# Patient Record
Sex: Female | Born: 1965 | Race: White | Hispanic: No | Marital: Single | Smoking: Never smoker
Health system: Southern US, Community
[De-identification: ages and names within clinical notes are randomized; demographics above are authoritative.]

## PROBLEM LIST (undated history)

## (undated) DIAGNOSIS — E785 Hyperlipidemia, unspecified: Secondary | ICD-10-CM

## (undated) DIAGNOSIS — G479 Sleep disorder, unspecified: Secondary | ICD-10-CM

## (undated) DIAGNOSIS — Z923 Personal history of irradiation: Secondary | ICD-10-CM

## (undated) DIAGNOSIS — C539 Malignant neoplasm of cervix uteri, unspecified: Secondary | ICD-10-CM

## (undated) DIAGNOSIS — Z8616 Personal history of COVID-19: Secondary | ICD-10-CM

## (undated) DIAGNOSIS — F419 Anxiety disorder, unspecified: Secondary | ICD-10-CM

## (undated) DIAGNOSIS — F32A Depression, unspecified: Secondary | ICD-10-CM

## (undated) DIAGNOSIS — H04123 Dry eye syndrome of bilateral lacrimal glands: Secondary | ICD-10-CM

## (undated) HISTORY — DX: Depression, unspecified: F32.A

## (undated) HISTORY — DX: Sleep disorder, unspecified: G47.9

## (undated) HISTORY — DX: Hyperlipidemia, unspecified: E78.5

## (undated) HISTORY — DX: Anxiety disorder, unspecified: F41.9

## (undated) HISTORY — PX: HERNIA REPAIR: SHX51

## (undated) HISTORY — DX: Personal history of irradiation: Z92.3

---

## 2006-03-28 ENCOUNTER — Encounter: Admission: RE | Admit: 2006-03-28 | Discharge: 2006-03-28 | Payer: Self-pay | Admitting: Unknown Physician Specialty

## 2007-05-06 ENCOUNTER — Encounter: Admission: RE | Admit: 2007-05-06 | Discharge: 2007-05-06 | Payer: Self-pay | Admitting: Unknown Physician Specialty

## 2008-06-09 ENCOUNTER — Encounter: Admission: RE | Admit: 2008-06-09 | Discharge: 2008-06-09 | Payer: Self-pay | Admitting: Unknown Physician Specialty

## 2009-01-08 DIAGNOSIS — M1990 Unspecified osteoarthritis, unspecified site: Secondary | ICD-10-CM

## 2009-01-08 HISTORY — DX: Unspecified osteoarthritis, unspecified site: M19.90

## 2009-06-13 ENCOUNTER — Encounter: Admission: RE | Admit: 2009-06-13 | Discharge: 2009-06-13 | Payer: Self-pay | Admitting: Unknown Physician Specialty

## 2010-06-13 ENCOUNTER — Other Ambulatory Visit: Payer: Self-pay | Admitting: Unknown Physician Specialty

## 2010-06-13 DIAGNOSIS — Z1231 Encounter for screening mammogram for malignant neoplasm of breast: Secondary | ICD-10-CM

## 2010-06-29 ENCOUNTER — Ambulatory Visit
Admission: RE | Admit: 2010-06-29 | Discharge: 2010-06-29 | Disposition: A | Discharge status: Home / Self Care | Payer: 59 | Source: Ambulatory Visit | Attending: Unknown Physician Specialty | Admitting: Unknown Physician Specialty

## 2010-06-29 DIAGNOSIS — Z1231 Encounter for screening mammogram for malignant neoplasm of breast: Secondary | ICD-10-CM

## 2011-05-31 ENCOUNTER — Other Ambulatory Visit: Payer: Self-pay | Admitting: Unknown Physician Specialty

## 2011-05-31 DIAGNOSIS — Z1231 Encounter for screening mammogram for malignant neoplasm of breast: Secondary | ICD-10-CM

## 2011-07-03 ENCOUNTER — Ambulatory Visit
Admission: RE | Admit: 2011-07-03 | Discharge: 2011-07-03 | Disposition: A | Discharge status: Home / Self Care | Payer: 59 | Source: Ambulatory Visit | Attending: Unknown Physician Specialty | Admitting: Unknown Physician Specialty

## 2011-07-03 DIAGNOSIS — Z1231 Encounter for screening mammogram for malignant neoplasm of breast: Secondary | ICD-10-CM

## 2013-04-28 ENCOUNTER — Other Ambulatory Visit: Payer: Self-pay

## 2013-04-28 DIAGNOSIS — Z1231 Encounter for screening mammogram for malignant neoplasm of breast: Secondary | ICD-10-CM

## 2013-05-08 ENCOUNTER — Encounter (INDEPENDENT_AMBULATORY_CARE_PROVIDER_SITE_OTHER): Payer: Self-pay

## 2013-05-08 ENCOUNTER — Ambulatory Visit
Admission: RE | Admit: 2013-05-08 | Discharge: 2013-05-08 | Disposition: A | Discharge status: Home / Self Care | Payer: 59 | Source: Ambulatory Visit

## 2013-05-08 DIAGNOSIS — Z1231 Encounter for screening mammogram for malignant neoplasm of breast: Secondary | ICD-10-CM

## 2014-05-18 ENCOUNTER — Other Ambulatory Visit: Payer: Self-pay

## 2014-05-18 DIAGNOSIS — Z1231 Encounter for screening mammogram for malignant neoplasm of breast: Secondary | ICD-10-CM

## 2014-06-14 ENCOUNTER — Ambulatory Visit
Admission: RE | Admit: 2014-06-14 | Discharge: 2014-06-14 | Disposition: A | Discharge status: Home / Self Care | Payer: 59 | Source: Ambulatory Visit

## 2014-06-14 DIAGNOSIS — Z1231 Encounter for screening mammogram for malignant neoplasm of breast: Secondary | ICD-10-CM

## 2015-07-25 ENCOUNTER — Other Ambulatory Visit: Payer: Self-pay | Admitting: Unknown Physician Specialty

## 2015-07-25 DIAGNOSIS — Z1231 Encounter for screening mammogram for malignant neoplasm of breast: Secondary | ICD-10-CM

## 2015-08-02 ENCOUNTER — Ambulatory Visit
Admission: RE | Admit: 2015-08-02 | Discharge: 2015-08-02 | Disposition: A | Discharge status: Home / Self Care | Payer: 59 | Source: Ambulatory Visit | Attending: Unknown Physician Specialty | Admitting: Unknown Physician Specialty

## 2015-08-02 DIAGNOSIS — Z1231 Encounter for screening mammogram for malignant neoplasm of breast: Secondary | ICD-10-CM

## 2016-08-23 ENCOUNTER — Other Ambulatory Visit: Payer: Self-pay | Admitting: Unknown Physician Specialty

## 2016-08-23 DIAGNOSIS — Z1231 Encounter for screening mammogram for malignant neoplasm of breast: Secondary | ICD-10-CM

## 2016-09-04 ENCOUNTER — Ambulatory Visit
Admission: RE | Admit: 2016-09-04 | Discharge: 2016-09-04 | Disposition: A | Discharge status: Home / Self Care | Payer: 59 | Source: Ambulatory Visit | Attending: Unknown Physician Specialty | Admitting: Unknown Physician Specialty

## 2016-09-04 DIAGNOSIS — Z1231 Encounter for screening mammogram for malignant neoplasm of breast: Secondary | ICD-10-CM

## 2017-09-16 ENCOUNTER — Other Ambulatory Visit: Payer: Self-pay | Admitting: Unknown Physician Specialty

## 2017-09-16 DIAGNOSIS — Z1231 Encounter for screening mammogram for malignant neoplasm of breast: Secondary | ICD-10-CM

## 2017-10-11 ENCOUNTER — Ambulatory Visit
Admission: RE | Admit: 2017-10-11 | Discharge: 2017-10-11 | Disposition: A | Discharge status: Home / Self Care | Payer: Managed Care, Other (non HMO) | Source: Ambulatory Visit | Attending: Unknown Physician Specialty | Admitting: Unknown Physician Specialty

## 2017-10-11 DIAGNOSIS — Z1231 Encounter for screening mammogram for malignant neoplasm of breast: Secondary | ICD-10-CM

## 2018-10-27 ENCOUNTER — Other Ambulatory Visit: Payer: Self-pay | Admitting: Unknown Physician Specialty

## 2018-10-27 DIAGNOSIS — Z1231 Encounter for screening mammogram for malignant neoplasm of breast: Secondary | ICD-10-CM

## 2018-12-19 ENCOUNTER — Other Ambulatory Visit: Payer: Self-pay

## 2018-12-19 ENCOUNTER — Ambulatory Visit
Admission: RE | Admit: 2018-12-19 | Discharge: 2018-12-19 | Disposition: A | Discharge status: Home / Self Care | Payer: Managed Care, Other (non HMO) | Source: Ambulatory Visit | Attending: Unknown Physician Specialty | Admitting: Unknown Physician Specialty

## 2018-12-19 DIAGNOSIS — Z1231 Encounter for screening mammogram for malignant neoplasm of breast: Secondary | ICD-10-CM

## 2020-01-09 DIAGNOSIS — Z85828 Personal history of other malignant neoplasm of skin: Secondary | ICD-10-CM

## 2020-01-09 DIAGNOSIS — Z9889 Other specified postprocedural states: Secondary | ICD-10-CM

## 2020-01-09 DIAGNOSIS — R7303 Prediabetes: Secondary | ICD-10-CM

## 2020-01-09 DIAGNOSIS — C801 Malignant (primary) neoplasm, unspecified: Secondary | ICD-10-CM

## 2020-01-09 HISTORY — DX: Malignant (primary) neoplasm, unspecified: C80.1

## 2020-01-09 HISTORY — DX: Other specified postprocedural states: Z98.890

## 2020-01-09 HISTORY — PX: BASAL CELL CARCINOMA EXCISION: SHX1214

## 2020-01-09 HISTORY — DX: Personal history of other malignant neoplasm of skin: Z85.828

## 2020-01-09 HISTORY — DX: Prediabetes: R73.03

## 2020-01-18 ENCOUNTER — Other Ambulatory Visit: Payer: Self-pay | Admitting: Unknown Physician Specialty

## 2020-01-18 DIAGNOSIS — Z1231 Encounter for screening mammogram for malignant neoplasm of breast: Secondary | ICD-10-CM

## 2020-03-02 ENCOUNTER — Other Ambulatory Visit: Payer: Self-pay

## 2020-03-02 ENCOUNTER — Ambulatory Visit
Admission: RE | Admit: 2020-03-02 | Discharge: 2020-03-02 | Disposition: A | Discharge status: Home / Self Care | Payer: Managed Care, Other (non HMO) | Source: Ambulatory Visit | Attending: Unknown Physician Specialty | Admitting: Unknown Physician Specialty

## 2020-03-02 DIAGNOSIS — Z1231 Encounter for screening mammogram for malignant neoplasm of breast: Secondary | ICD-10-CM

## 2020-08-25 ENCOUNTER — Encounter: Payer: Self-pay | Admitting: Internal Medicine

## 2020-10-11 ENCOUNTER — Ambulatory Visit: Payer: Managed Care, Other (non HMO) | Admitting: Cardiovascular Disease

## 2020-11-10 ENCOUNTER — Ambulatory Visit (INDEPENDENT_AMBULATORY_CARE_PROVIDER_SITE_OTHER): Payer: Managed Care, Other (non HMO) | Admitting: Orthopaedic Surgery

## 2020-11-10 ENCOUNTER — Ambulatory Visit (INDEPENDENT_AMBULATORY_CARE_PROVIDER_SITE_OTHER): Payer: Managed Care, Other (non HMO)

## 2020-11-10 ENCOUNTER — Encounter: Payer: Self-pay | Admitting: Orthopaedic Surgery

## 2020-11-10 ENCOUNTER — Other Ambulatory Visit: Payer: Self-pay

## 2020-11-10 VITALS — BP 120/62 | HR 65 | Ht 66.0 in | Wt 151.0 lb

## 2020-11-10 DIAGNOSIS — M542 Cervicalgia: Secondary | ICD-10-CM

## 2020-11-10 DIAGNOSIS — G959 Disease of spinal cord, unspecified: Secondary | ICD-10-CM | POA: Diagnosis not present

## 2020-11-10 DIAGNOSIS — M4802 Spinal stenosis, cervical region: Secondary | ICD-10-CM

## 2020-11-10 MED ORDER — DIAZEPAM 5 MG PO TABS: ORAL_TABLET | ORAL | 0 refills | Status: DC

## 2020-11-10 NOTE — Progress Notes (Signed)
Office Visit Note   Patient: Robin Steele           Date of Birth: 04/24/1965           MRN: [id number] Visit Date: 11/10/2020              Requested by: Daneil Dolin, [address] [place],  [place] [address] PCP: Rosalee Kaufman, PA-C   Assessment & Plan: Visit Diagnoses:  1. Neck pain   2. 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 Spinal stenosis of cervical region     Plan: Patient has progressive weakness abnormal gait clonus changes consistent with cervical cord compression with cervical facet arthropathy and spondylosis.  With hand numbness this is likely cervical rather than thoracic involvement with cord compression.  Differential includes possible syringomyelia.  Office follow-up after urgent MRI scan.  Valium tablets prescribed for claustrophobia.  Follow-Up Instructions: No follow-ups on file.   Orders:  Orders Placed This Encounter  Procedures   XR Cervical Spine 2 or 3 views   MR Cervical Spine w/o contrast   Meds ordered this encounter  Medications   diazepam (VALIUM) 5 MG tablet    Sig: Take 2 tablets one hour prior to scan. May repeat with last dose if needed. MUST HAVE DRIVER. Handwritten rx.    Dispense:  3 tablet    Refill:  0      Procedures: No procedures performed   Clinical Data: No additional findings.   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  She has had increasing balance problems has not fallen.  She took prednisone 2 weeks ago seemed to help the pain and maybe gave her some energy she is not sure if it helps the tingling in her fingers.  She has noticed weakness in her arms.  Review of Systems positive for cervical conization coming up next week for abnormal cells.  Positive for anxiety bladder  problems history of migraine.  Pregnancy and umbilical hernia surgeries in the past.  Positive for progressive weakness swelling disorders dense August 2022.  All other systems noncontributory HPI.   Objective: Vital Signs: BP 120/62   Pulse 65   Ht 5\' 6"  (1.676 m)   Wt 151 lb (68.5 kg)   BMI 24.37 kg/m   Physical Exam Constitutional:      Appearance: She is well-developed.  HENT:     Head: Normocephalic.     Right Ear: External ear normal.     Left Ear: External ear normal. There is no impacted cerumen.  Eyes:     Pupils: Pupils are equal, round, and reactive to light.  Neck:     Thyroid: No thyromegaly.     Trachea: No tracheal deviation.  Cardiovascular:     Rate and Rhythm: Normal rate.  Pulmonary:     Effort: Pulmonary effort is normal.  Abdominal:     Palpations: Abdomen is soft.  Musculoskeletal:     Cervical back: No rigidity.  Skin:    General: Skin is warm and dry.  Neurological:     Mental Status: She is alert and oriented to person, place, and time.  Psychiatric:        Behavior: Behavior normal.    Ortho Exam patient has 3+ triceps  reflex symmetrical only trace brachial radialis reflex and 2+ biceps reflex.  Patient has 4 beat clonus on the left leg 3 beat clonus on the right leg and 4+ knee jerk left and right knee.  Decreased balance.  Mild weakness wrist flexion extension interossei.  No atrophy of the upper extremities.  Bilateral brachial plexus tenderness no change cervical pain with distraction or or compression.Moderate Lhermitte.   Specialty Comments:  No specialty comments available.  Imaging: XR Cervical Spine 2 or 3 views  Result Date: 11/10/2020 AP lateral cervical spine images are obtained and reviewed.  This shows facet enlargement C3-4 C4-5 C5-6 worse on the left than right.  Uncovertebral degenerative changes straightening of the cervical spine and anterior posterior spurring worse at C6-7 and C4-5 and C5-6. Impression.  Cervical  spondylosis with posterior endplate spurring.    PMFS History: Patient Active Problem List   Diagnosis Date Noted   Spinal stenosis of cervical region 11/10/2020    No past medical history on file.  Family History  Problem Relation Age of Onset   Breast cancer Maternal Aunt     No past surgical history on file. Social History   Occupational History   Not on file  Tobacco Use   Smoking status: Never   Smokeless tobacco: Never  Substance and Sexual Activity   Alcohol use: Not Currently   Drug use: Not on file   Sexual activity: Not on file

## 2020-11-11 ENCOUNTER — Ambulatory Visit (HOSPITAL_COMMUNITY)
Admission: RE | Admit: 2020-11-11 | Discharge: 2020-11-11 | Disposition: A | Discharge status: Home / Self Care | Payer: Managed Care, Other (non HMO) | Source: Ambulatory Visit | Attending: Orthopaedic Surgery | Admitting: Orthopaedic Surgery

## 2020-11-11 ENCOUNTER — Other Ambulatory Visit: Payer: Self-pay

## 2020-11-11 DIAGNOSIS — G959 Disease of spinal cord, unspecified: Secondary | ICD-10-CM | POA: Insufficient documentation

## 2020-11-14 ENCOUNTER — Telehealth: Payer: Self-pay | Admitting: Orthopaedic Surgery

## 2020-11-14 NOTE — Telephone Encounter (Signed)
Pt called requesting document/letter of estimated time off of work after surgery. Please call pt about this matter at [phone]. Pt would like sent thur my chart.

## 2020-11-14 NOTE — Telephone Encounter (Signed)
Can you please advise. Are you anticipating approx 6 weeks out of work post op?

## 2020-11-15 NOTE — Telephone Encounter (Signed)
Letter entered and sent to patient through My Chart. I left voicemail for patient advising.

## 2020-11-16 ENCOUNTER — Other Ambulatory Visit: Payer: Self-pay

## 2020-11-16 ENCOUNTER — Telehealth: Payer: Self-pay

## 2020-11-16 ENCOUNTER — Telehealth: Payer: Self-pay | Admitting: Orthopaedic Surgery

## 2020-11-16 ENCOUNTER — Encounter: Payer: Self-pay | Admitting: Internal Medicine

## 2020-11-16 ENCOUNTER — Ambulatory Visit: Payer: Managed Care, Other (non HMO) | Admitting: Surgery

## 2020-11-16 ENCOUNTER — Telehealth: Payer: Self-pay | Admitting: Internal Medicine

## 2020-11-16 ENCOUNTER — Ambulatory Visit: Payer: Managed Care, Other (non HMO) | Admitting: Internal Medicine

## 2020-11-16 ENCOUNTER — Encounter: Payer: Self-pay | Admitting: Surgery

## 2020-11-16 VITALS — BP 136/85 | HR 78 | Ht 66.0 in | Wt 151.0 lb

## 2020-11-16 VITALS — BP 130/70 | HR 77 | Ht 65.0 in | Wt 151.8 lb

## 2020-11-16 DIAGNOSIS — R002 Palpitations: Secondary | ICD-10-CM

## 2020-11-16 DIAGNOSIS — M4802 Spinal stenosis, cervical region: Secondary | ICD-10-CM

## 2020-11-16 NOTE — Telephone Encounter (Signed)
Pt submitted medical release form, FMLA forms, and $25.00 check to [employer]. Accepted 11/16/20

## 2020-11-16 NOTE — Progress Notes (Signed)
Cardiology Office Note:    Date:  11/16/2020   ID:  Robin Steele, DOB Aug 18, 1965, MRN [id number]  PCP:  Rosine Door   [facility] Providers Cardiologist:  None     Referring MD: Daneil Dolin, MD   No chief complaint on file. Fluttering  History of Present Illness:    Robin Steele is a 55 y.o. female with a hx of anxiety,  referral for palpations  She  notes heart fluttering and racing. It occurs every once in awhile. The racing occurs during the middle of the day. It started in April. It has been approximately each day but it is improving. She had some LH. She notes some dizziness. No syncope. She denies SOB. She feels a knot in the middle of the chest and feels like indigestion. She is planned for disc fusion. Planned by Dr. Lorin Mercy. Family hx includes her rmother who had an MI 55 or 56; she was 37 when passing. Her father had no heart disease, he is deceased. No SCD. No smoking. She drinks sweat tea. He started drinking soft drinks.     08/22/2020 EKG- NSR , non specific ST changes  08/18/2020 TC 211 TG 231 HDL 42 LDL 128 Crt 1.00 LFTs normal Hgb normal A1c 5.8%  Past Medical History:  Diagnosis Date   Anxiety    Depression    Hyperlipidemia      Current Medications: Current Meds  Medication Sig   Ascorbic Acid (VITAMIN C) 1000 MG tablet Take 1,000 mg by mouth at bedtime.   Cyanocobalamin (B-12) 5000 MCG CAPS Take 5,000 mcg by mouth at bedtime.   DULoxetine (CYMBALTA) 60 MG capsule Take 60 mg by mouth at bedtime.   Magnesium 250 MG TABS Take 250 mg by mouth at bedtime.   Multiple Vitamins-Minerals (CENTRUM SILVER 50+WOMEN) TABS Take 1 tablet by mouth at bedtime.   Omega-3 1000 MG CAPS Take 1,000 mg by mouth at bedtime.   Red Yeast Rice 600 MG TABS Take 1,200 mg by mouth at bedtime.   temazepam (RESTORIL) 30 MG capsule Take 30 mg by mouth at bedtime.   valACYclovir (VALTREX) 500 MG tablet Take 500 mg by mouth at bedtime.   zinc  gluconate 50 MG tablet Take 50 mg by mouth at bedtime.     Allergies:   Patient has no known allergies.   Social History   Socioeconomic History   Marital status: Single    Spouse name: Not on file   Number of children: Not on file   Years of education: Not on file   Highest education level: Not on file  Occupational History   Not on file  Tobacco Use   Smoking status: Never   Smokeless tobacco: Never  Substance and Sexual Activity   Alcohol use: Not Currently   Drug use: Not on file   Sexual activity: Not on file  Other Topics Concern   Not on file  Social History Narrative   Not on file   Social Determinants of Health   Financial Resource Strain: Not on file  Food Insecurity: Not on file  Transportation Needs: Not on file  Physical Activity: Not on file  Stress: Not on file  Social Connections: Not on file     Family History: The patient's family history includes Breast cancer in her maternal aunt.  ROS:   Please see the history of present illness.     All other systems reviewed and are negative.  EKGs/Labs/Other Studies  Reviewed:    The following studies were reviewed today:   EKG:  EKG is  ordered today.  The ekg ordered today demonstrates  NSR, non specific st-t changes  Recent Labs: No results found for requested labs within last 8760 hours.  Recent Lipid Panel No results found for: CHOL, TRIG, HDL, CHOLHDL, VLDL, LDLCALC, LDLDIRECT   Risk Assessment/Calculations:           Physical Exam:    VS:    Vitals:   11/16/20 1317  BP: 130/70  Pulse: 77  SpO2: 99%     Wt Readings from Last 3 Encounters:  11/16/20 151 lb (68.5 kg)  11/10/20 151 lb (68.5 kg)     GEN:  Well nourished, well developed in no acute distress HEENT: Normal NECK: No JVD; No carotid bruits LYMPHATICS: No lymphadenopathy CARDIAC: RRR, no murmurs, rubs, gallops RESPIRATORY:  Clear to auscultation without rales, wheezing or rhonchi  ABDOMEN: Soft, non-tender,  non-distended MUSCULOSKELETAL:  No edema; No deformity  SKIN: Warm and dry NEUROLOGIC:  Alert and oriented x 3 PSYCHIATRIC:  Normal affect   ASSESSMENT:    1. Palpitations   - She does not have high risk features including syncope c/f arrhythmia , family hx of SCD, or abnormalities on her EKG. - I have no reservations for her spinal procedure, she is low risk for a cardiac event  PLAN:    In order of problems listed above:  PRN Follow up      Medication Adjustments/Labs and Tests Ordered: Current medicines are reviewed at length with the patient today.  Concerns regarding medicines are outlined above.  Orders Placed This Encounter  Procedures   EKG 12-Lead     Signed, Janina Mayo, MD  11/16/2020 1:23 PM    [facility]

## 2020-11-16 NOTE — Telephone Encounter (Signed)
[employer] forms received. To [employer].

## 2020-11-16 NOTE — Telephone Encounter (Signed)
    Patient Name: Robin Steele  DOB: 1965/04/11 MRN: [id number]  Primary Cardiologist: Janina Mayo, MD  Chart reviewed as part of pre-operative protocol coverage. Patient was seen by Dr. Harl Bowie for a New Patient visit today. Per her office visit note, "I have no reservations for her spinal procedure. She is low risk for a cardiac event."  I will route this recommendation to the requesting party via Epic fax function and remove from pre-op pool.  Please call with questions.  Darreld Mclean, PA-C 11/16/2020, 3:11 PM

## 2020-11-16 NOTE — Pre-Procedure Instructions (Signed)
Surgical Instructions    Your procedure is scheduled on Friday 11/18/20.   Report to [facility] Main Entrance "A" at 05:30 A.M., then check in with the Admitting office.  Call this number if you have problems the morning of surgery:  [phone]   If you have any questions prior to your surgery date call [phone]: Open Monday-Friday 8am-4pm    Remember:  Do not eat after midnight the night before your surgery  You may drink clear liquids until 04:30 A.M. the morning of your surgery.   Clear liquids allowed are: Water, Non-Citrus Juices (without pulp), Carbonated Beverages, Clear Tea, Black Coffee ONLY (NO MILK, CREAM OR POWDERED CREAMER of any kind), and Gatorade    Take these medicines the morning of surgery with A SIP OF WATER   NONE   As of today, STOP taking any Aspirin (unless otherwise instructed by your surgeon) Aleve, Naproxen, Ibuprofen, Motrin, Advil, Goody's, BC's, all herbal medications, fish oil, and all vitamins.     After your COVID test   You are not required to quarantine however you are required to wear a well-fitting mask when you are out and around people not in your household.  If your mask becomes wet or soiled, replace with a new one.  Wash your hands often with soap and water for 20 seconds or clean your hands with an alcohol-based hand sanitizer that contains at least 60% alcohol.  Do not share personal items.  Notify your provider: if you are in close contact with someone who has COVID  or if you develop a fever of 100.4 or greater, sneezing, cough, sore throat, shortness of breath or body aches.             Do not wear jewelry or makeup Do not wear lotions, powders, perfumes/colognes, or deodorant. Do not shave 48 hours prior to surgery.  Men may shave face and neck. Do not bring valuables to the hospital. DO Not wear nail polish, gel polish, artificial nails, or any other type of covering on natural nails including finger and  toenails. If patients have artificial nails, gel coating, etc. that need to be removed by a nail salon, please have this removed prior to surgery or surgery may need to be canceled/delayed if the surgeon/ anesthesia feels like the patient is unable to be adequately monitored.             [facility] is not responsible for any belongings or valuables.  Do NOT Smoke (Tobacco/Vaping)  24 hours prior to your procedure  If you use a CPAP at night, you may bring your mask for your overnight stay.   Contacts, glasses, hearing aids, dentures or partials may not be worn into surgery, please bring cases for these belongings   For patients admitted to the hospital, discharge time will be determined by your treatment team.   Patients discharged the day of surgery will not be allowed to drive home, and someone needs to stay with them for 24 hours.  NO VISITORS WILL BE ALLOWED IN PRE-OP WHERE PATIENTS ARE PREPPED FOR SURGERY.  ONLY 1 SUPPORT PERSON MAY BE PRESENT IN THE WAITING ROOM WHILE YOU ARE IN SURGERY.  IF YOU ARE TO BE ADMITTED, ONCE YOU ARE IN YOUR ROOM YOU WILL BE ALLOWED TWO (2) VISITORS. 1 (ONE) VISITOR MAY STAY OVERNIGHT BUT MUST ARRIVE TO THE ROOM BY 8pm.  Minor children may have two parents present. Special consideration for safety and communication needs will be reviewed on  a case by case basis.  Special instructions:    Oral Hygiene is also important to reduce your risk of infection.  Remember - BRUSH YOUR TEETH THE MORNING OF SURGERY WITH YOUR REGULAR TOOTHPASTE   [facility]- Preparing For Surgery  Before surgery, you can play an important role. Because skin is not sterile, your skin needs to be as free of germs as possible. You can reduce the number of germs on your skin by washing with CHG (chlorahexidine gluconate) Soap before surgery.  CHG is an antiseptic cleaner which kills germs and bonds with the skin to continue killing germs even after washing.     Please do not use if you  have an allergy to CHG or antibacterial soaps. If your skin becomes reddened/irritated stop using the CHG.  Do not shave (including legs and underarms) for at least 48 hours prior to first CHG shower. It is OK to shave your face.  Please follow these instructions carefully.     Shower the NIGHT BEFORE SURGERY and the MORNING OF SURGERY with CHG Soap.   If you chose to wash your hair, wash your hair first as usual with your normal shampoo. After you shampoo, rinse your hair and body thoroughly to remove the shampoo.  Then [employer] and genitals (private parts) with your normal soap and rinse thoroughly to remove soap.  After that Use CHG Soap as you would any other liquid soap. You can apply CHG directly to the skin and wash gently with a scrungie or a clean washcloth.   Apply the CHG Soap to your body ONLY FROM THE NECK DOWN.  Do not use on open wounds or open sores. Avoid contact with your eyes, ears, mouth and genitals (private parts). Wash Face and genitals (private parts)  with your normal soap.   Wash thoroughly, paying special attention to the area where your surgery will be performed.  Thoroughly rinse your body with warm water from the neck down.  DO NOT shower/wash with your normal soap after using and rinsing off the CHG Soap.  Pat yourself dry with a CLEAN TOWEL.  Wear CLEAN PAJAMAS to bed the night before surgery  Place CLEAN SHEETS on your bed the night before your surgery  DO NOT SLEEP WITH PETS.   Day of Surgery:  Take a shower with CHG soap. Wear Clean/Comfortable clothing the morning of surgery Do not apply any deodorants/lotions.   Remember to brush your teeth WITH YOUR REGULAR TOOTHPASTE.   Please read over the following fact sheets that you were given.

## 2020-11-16 NOTE — Progress Notes (Signed)
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  Patient also continues have ongoing neck pain and upper extremity radiculopathy with feeling of unsteady gait.  States that she is wanting to proceed with C4-5 ACDF is scheduled.  Today history and physical performed.  Review of systems negative.  Surgical procedure discussed in great detail along with potential rehab/recovery time.  All questions answered and she wishes to proceed.

## 2020-11-16 NOTE — Telephone Encounter (Signed)
   [facility] Pre-operative Risk Assessment    Patient Name: Robin Steele  DOB: 1965/12/01 MRN: [id number]  HEARTCARE STAFF:  - IMPORTANT!!!!!! Under Visit Info/Reason for Call, type in Other and utilize the format Clearance MM/DD/YY or Clearance TBD. Do not use dashes or single digits. - Please review there is not already an duplicate clearance open for this procedure. - If request is for dental extraction, please clarify the # of teeth to be extracted. - If the patient is currently at the dentist's office, call Pre-Op Callback Staff (MA/nurse) to input urgent request.  - If the patient is not currently in the dentist office, please route to the Pre-Op pool.  Request for surgical clearance:  What type of surgery is being performed? C4-5 Fusion  When is this surgery scheduled? TBD  What type of clearance is required (medical clearance vs. Pharmacy clearance to hold med vs. Both)? Both  Are there any medications that need to be held prior to surgery and how long? None  Practice name and name of physician performing surgery? [facility]  What is the office phone number? [phone]   7.   What is the office fax number? [phone]  8.   Anesthesia type (None, local, MAC, general) ? General   Orvan July 11/16/2020, 12:20 PM  _________________________________________________________________   (provider comments below)

## 2020-11-16 NOTE — Telephone Encounter (Signed)
Pt states that although she established care with Dr. Harl Bowie based on availability and upcoming procedure she is wanting to be in care of Dr. Johnsie Cancel... are you both ok with this?

## 2020-11-16 NOTE — Patient Instructions (Signed)
Medication Instructions:  No Changes In Medications at this time.  *If you need a refill on your cardiac medications before your next appointment, please call your pharmacy*  Follow-Up: At [facility], you and your health needs are our priority.  As part of our continuing mission to provide you with exceptional heart care, we have created designated Provider Care Teams.  These Care Teams include your primary Cardiologist (physician) and Advanced Practice Providers (APPs -  Physician Assistants and Nurse Practitioners) who all work together to provide you with the care you need, when you need it.  Your next appointment:   AS NEEDED   The format for your next appointment:   In Person  Provider:   Janina Mayo, MD {  Other Instructions Palpitations Palpitations are feelings that your heartbeat is not normal. Your heartbeat may feel like it is: Uneven (irregular). Faster than normal. Fluttering. Skipping a beat. This is usually not a serious problem. However, a doctor will do tests and check your medical history to make sure that you do not have a serious heart problem. Follow these instructions at home: Watch for any changes in your condition. Tell your doctor about any changes. Take these actions to help manage your symptoms: Eating and drinking Follow instructions from your doctor about things to eat and drink. You may be told to avoid these things: Drinks that have caffeine in them, such as coffee, tea, soft drinks, and energy drinks. Chocolate. Alcohol. Diet pills. Lifestyle   Try to lower your stress. These things can help you relax: Yoga. Deep breathing and meditation. Guided imagery. This is using words and images to create positive thoughts. Exercise, including swimming, jogging, and walking. Tell your doctor if you have more abnormal heartbeats when you are active. If you have chest pain or feel short of breath with exercise, do not keep doing the exercise until you  are seen by your doctor. Biofeedback. This is using your mind to control things in your body, such as your heartbeat. Get plenty of rest and sleep. Keep a regular bed time. Do not use drugs, such as cocaine or ecstasy. Do not use marijuana. Do not smoke or use any products that contain nicotine or tobacco. If you need help quitting, ask your doctor. General instructions Take over-the-counter and prescription medicines only as told by your doctor. Keep all follow-up visits. You may need more tests if palpitations do not go away or get worse. Contact a doctor if: You keep having fast or uneven heartbeats for a long time. Your symptoms happen more often. Get help right away if: You have chest pain. You feel short of breath. You have a very bad headache. You feel dizzy. You faint. These symptoms may be an emergency. Get help right away. Call your local emergency services (911 in the [place] Do not wait to see if the symptoms will go away. Do not drive yourself to the hospital. Summary Palpitations are feelings that your heartbeat is uneven or faster than normal. It may feel like your heart is fluttering or skipping a beat. Avoid food and drinks that may cause this condition. These include caffeine, chocolate, and alcohol. Try to lower your stress. Do not smoke or use drugs. Get help right away if you faint, feel dizzy, feel short of breath, have chest pain, or have a very bad headache. This information is not intended to replace advice given to you by your health care provider. Make sure you discuss any questions you have  with your health care provider. Document Revised: 05/18/2020 Document Reviewed: 05/18/2020 Elsevier Patient Education  [place].

## 2020-11-17 ENCOUNTER — Encounter (HOSPITAL_COMMUNITY)
Admission: RE | Admit: 2020-11-17 | Discharge: 2020-11-17 | Disposition: A | Discharge status: Home / Self Care | Payer: Managed Care, Other (non HMO) | Source: Ambulatory Visit | Attending: Orthopaedic Surgery | Admitting: Orthopaedic Surgery

## 2020-11-17 ENCOUNTER — Encounter (HOSPITAL_COMMUNITY): Payer: Self-pay | Admitting: *Deleted

## 2020-11-17 VITALS — BP 144/86 | HR 65 | Temp 98.7°F | Resp 17 | Ht 65.0 in | Wt 148.7 lb

## 2020-11-17 DIAGNOSIS — Z20822 Contact with and (suspected) exposure to covid-19: Secondary | ICD-10-CM | POA: Diagnosis not present

## 2020-11-17 DIAGNOSIS — Z01812 Encounter for preprocedural laboratory examination: Secondary | ICD-10-CM | POA: Insufficient documentation

## 2020-11-17 DIAGNOSIS — R7303 Prediabetes: Secondary | ICD-10-CM | POA: Diagnosis not present

## 2020-11-17 DIAGNOSIS — Z01818 Encounter for other preprocedural examination: Secondary | ICD-10-CM

## 2020-11-17 LAB — BASIC METABOLIC PANEL
Anion gap: 6 (ref 5–15)
BUN: 12 mg/dL (ref 6–20)
CO2: 29 mmol/L (ref 22–32)
Calcium: 9.2 mg/dL (ref 8.9–10.3)
Chloride: 104 mmol/L (ref 98–111)
Creatinine, Ser: 1.02 mg/dL — ABNORMAL HIGH (ref 0.44–1.00)
GFR, Estimated: >60 mL/min (ref >60)
Glucose, Bld: 107 mg/dL — ABNORMAL HIGH (ref 70–99)
Potassium: 4 mmol/L (ref 3.5–5.1)
Sodium: 139 mmol/L (ref 135–145)

## 2020-11-17 LAB — CBC
HCT: 44.5 % (ref 36.0–46.0)
Hemoglobin: 14.3 g/dL (ref 12.0–15.0)
MCH: 27.5 pg (ref 26.0–34.0)
MCHC: 32.1 g/dL (ref 30.0–36.0)
MCV: 85.6 fL (ref 80.0–100.0)
Platelets: 315 10*3/uL (ref 150–400)
RBC: 5.2 MIL/uL — ABNORMAL HIGH (ref 3.87–5.11)
RDW: 13 % (ref 11.5–15.5)
WBC: 6.6 10*3/uL (ref 4.0–10.5)
nRBC: 0 % (ref 0.0–0.2)

## 2020-11-17 LAB — GLUCOSE, CAPILLARY: Glucose-Capillary: 115 mg/dL — ABNORMAL HIGH (ref 70–99)

## 2020-11-17 LAB — HEMOGLOBIN A1C
Hgb A1c MFr Bld: 6.2 % — ABNORMAL HIGH (ref 4.8–5.6)
Mean Plasma Glucose: 131.24 mg/dL

## 2020-11-17 LAB — SARS CORONAVIRUS 2 (TAT 6-24 HRS): SARS Coronavirus 2: NEGATIVE

## 2020-11-17 LAB — SURGICAL PCR SCREEN
MRSA, PCR: NEGATIVE
Staphylococcus aureus: POSITIVE — AB

## 2020-11-17 NOTE — Anesthesia Preprocedure Evaluation (Addendum)
Anesthesia Evaluation  Patient identified by MRN, date of birth, ID band Patient awake    Reviewed: Allergy & Precautions, NPO status , Patient's Chart, lab work & pertinent test results  Airway Mallampati: I  TM Distance: >3 FB Neck ROM: Full    Dental no notable dental hx.    Pulmonary    Pulmonary exam normal breath sounds clear to auscultation       Cardiovascular Exercise Tolerance: Good negative cardio ROS Normal cardiovascular exam Rhythm:Regular Rate:Normal     Neuro/Psych PSYCHIATRIC DISORDERS Anxiety Depression negative neurological ROS     GI/Hepatic   Endo/Other  prediabetes  Renal/GU negative Renal ROS  negative genitourinary   Musculoskeletal  (+) Arthritis  (cervical spinal stenosis), Osteoarthritis,    Abdominal   Peds negative pediatric ROS (+)  Hematology negative hematology ROS (+)   Anesthesia Other Findings No numbness/tingling or pain in neck or arms with full neck extension/rotation  Reproductive/Obstetrics                            Anesthesia Physical Anesthesia Plan  ASA: 2  Anesthesia Plan: General   Post-op Pain Management:    Induction: Intravenous  PONV Risk Score and Plan: 3 and Midazolam, Scopolamine patch - Pre-op, Treatment may vary due to age or medical condition, Ondansetron and Dexamethasone  Airway Management Planned: Oral ETT  Additional Equipment: None  Intra-op Plan:   Post-operative Plan: Extubation in OR  Informed Consent: I have reviewed the patients History and Physical, chart, labs and discussed the procedure including the risks, benefits and alternatives for the proposed anesthesia with the patient or authorized representative who has indicated his/her understanding and acceptance.     Dental advisory given  Plan Discussed with: Anesthesiologist and CRNA  Anesthesia Plan Comments: (GETA. Norton Blizzard, MD  )        Anesthesia Quick Evaluation

## 2020-11-17 NOTE — Progress Notes (Signed)
PCP - Clemmie Krill, PA Cardiologist - Pt saw Dr. Phineas Inches due to hx of palpitations- no abnormalities found, cleared for surgery  PPM/ICD - denies   Chest x-ray - denies EKG - 11/16/20 Stress Test - denies ECHO - denies Cardiac Cath - denies  Sleep Study - pt states she did a home sleep study about 5 years ago but was not diagnosed with OSA  DM- pre-diabetic Pt does not check CBG  Blood Thinner Instructions: n/a Aspirin Instructions: n/a  ERAS Protcol - yes, no drink   COVID TEST- 11/17/20 at PAT   Anesthesia review: no  Patient denies shortness of breath, fever, cough and chest pain at PAT appointment   All instructions explained to the patient, with a verbal understanding of the material. Patient agrees to go over the instructions while at home for a better understanding. Patient also instructed to wear a mask in public after being tested for COVID-19. The opportunity to ask questions was provided.

## 2020-11-18 ENCOUNTER — Observation Stay (HOSPITAL_COMMUNITY)
Admission: RE | Admit: 2020-11-18 | Discharge: 2020-11-19 | Disposition: A | Discharge status: Home / Self Care | Payer: Managed Care, Other (non HMO) | Attending: Orthopaedic Surgery | Admitting: Orthopaedic Surgery

## 2020-11-18 ENCOUNTER — Ambulatory Visit (HOSPITAL_COMMUNITY): Payer: Managed Care, Other (non HMO)

## 2020-11-18 ENCOUNTER — Other Ambulatory Visit: Payer: Self-pay

## 2020-11-18 ENCOUNTER — Ambulatory Visit (HOSPITAL_COMMUNITY): Payer: Managed Care, Other (non HMO) | Admitting: Vascular Surgery

## 2020-11-18 ENCOUNTER — Encounter (HOSPITAL_COMMUNITY)
Admission: RE | Disposition: A | Discharge status: Home / Self Care | Payer: Self-pay | Source: Home / Self Care | Attending: Orthopaedic Surgery

## 2020-11-18 ENCOUNTER — Encounter (HOSPITAL_COMMUNITY): Payer: Self-pay | Admitting: Orthopaedic Surgery

## 2020-11-18 DIAGNOSIS — M4802 Spinal stenosis, cervical region: Principal | ICD-10-CM | POA: Insufficient documentation

## 2020-11-18 DIAGNOSIS — Z981 Arthrodesis status: Secondary | ICD-10-CM

## 2020-11-18 DIAGNOSIS — M50021 Cervical disc disorder at C4-C5 level with myelopathy: Secondary | ICD-10-CM | POA: Insufficient documentation

## 2020-11-18 DIAGNOSIS — Z85828 Personal history of other malignant neoplasm of skin: Secondary | ICD-10-CM | POA: Insufficient documentation

## 2020-11-18 DIAGNOSIS — Z419 Encounter for procedure for purposes other than remedying health state, unspecified: Secondary | ICD-10-CM

## 2020-11-18 DIAGNOSIS — R7303 Prediabetes: Secondary | ICD-10-CM | POA: Insufficient documentation

## 2020-11-18 DIAGNOSIS — M50121 Cervical disc disorder at C4-C5 level with radiculopathy: Secondary | ICD-10-CM | POA: Insufficient documentation

## 2020-11-18 HISTORY — PX: ANTERIOR CERVICAL DECOMP/DISCECTOMY FUSION: SHX1161

## 2020-11-18 LAB — GLUCOSE, CAPILLARY: Glucose-Capillary: 99 mg/dL (ref 70–99)

## 2020-11-18 SURGERY — ANTERIOR CERVICAL DECOMPRESSION/DISCECTOMY FUSION 1 LEVEL
Anesthesia: General

## 2020-11-18 MED ORDER — MIDAZOLAM HCL 2 MG/2ML IJ SOLN
INTRAMUSCULAR | Status: AC
  Filled 2020-11-18: qty 2

## 2020-11-18 MED ORDER — LIDOCAINE 2% (20 MG/ML) 5 ML SYRINGE
INTRAMUSCULAR | Status: AC
  Filled 2020-11-18: qty 5

## 2020-11-18 MED ORDER — SODIUM CHLORIDE 0.9% FLUSH: 3.0000 mL | INTRAVENOUS | Status: DC | PRN

## 2020-11-18 MED ORDER — LACTATED RINGERS IV SOLN: INTRAVENOUS | Status: DC

## 2020-11-18 MED ORDER — METHOCARBAMOL 500 MG PO TABS
ORAL_TABLET | ORAL | Status: AC
  Filled 2020-11-18: qty 1

## 2020-11-18 MED ORDER — EPHEDRINE 5 MG/ML INJ
INTRAVENOUS | Status: AC
  Filled 2020-11-18: qty 5

## 2020-11-18 MED ORDER — CEFAZOLIN SODIUM-DEXTROSE 2-4 GM/100ML-% IV SOLN
2.0000 g | INTRAVENOUS | Status: AC
  Administered 2020-11-18: 2 g via INTRAVENOUS

## 2020-11-18 MED ORDER — PROMETHAZINE HCL 25 MG/ML IJ SOLN: 6.2500 mg | INTRAMUSCULAR | Status: DC | PRN

## 2020-11-18 MED ORDER — SODIUM CHLORIDE 0.9% FLUSH
3.0000 mL | Freq: Two times a day (BID) | INTRAVENOUS | Status: DC
  Administered 2020-11-18 (×2): 3 mL via INTRAVENOUS

## 2020-11-18 MED ORDER — MIDAZOLAM HCL 2 MG/2ML IJ SOLN
INTRAMUSCULAR | Status: DC | PRN
  Administered 2020-11-18: 2 mg via INTRAVENOUS

## 2020-11-18 MED ORDER — BUPIVACAINE-EPINEPHRINE 0.5% -1:200000 IJ SOLN
INTRAMUSCULAR | Status: AC
  Filled 2020-11-18: qty 1

## 2020-11-18 MED ORDER — FENTANYL CITRATE (PF) 100 MCG/2ML IJ SOLN
INTRAMUSCULAR | Status: AC
  Filled 2020-11-18: qty 2

## 2020-11-18 MED ORDER — ONDANSETRON HCL 4 MG/2ML IJ SOLN: 4.0000 mg | Freq: Four times a day (QID) | INTRAMUSCULAR | Status: DC | PRN

## 2020-11-18 MED ORDER — SUGAMMADEX SODIUM 200 MG/2ML IV SOLN
INTRAVENOUS | Status: DC | PRN
  Administered 2020-11-18: 200 mg via INTRAVENOUS

## 2020-11-18 MED ORDER — FENTANYL CITRATE (PF) 250 MCG/5ML IJ SOLN
INTRAMUSCULAR | Status: DC | PRN
  Administered 2020-11-18 (×3): 50 ug via INTRAVENOUS

## 2020-11-18 MED ORDER — AMISULPRIDE (ANTIEMETIC) 5 MG/2ML IV SOLN: 10.0000 mg | Freq: Once | INTRAVENOUS | Status: DC | PRN

## 2020-11-18 MED ORDER — DOCUSATE SODIUM 100 MG PO CAPS
100.0000 mg | ORAL_CAPSULE | Freq: Two times a day (BID) | ORAL | Status: DC
  Administered 2020-11-18: 100 mg via ORAL
  Filled 2020-11-18: qty 1

## 2020-11-18 MED ORDER — METHOCARBAMOL 1000 MG/10ML IJ SOLN
500.0000 mg | Freq: Four times a day (QID) | INTRAVENOUS | Status: DC | PRN
  Filled 2020-11-18: qty 5

## 2020-11-18 MED ORDER — FENTANYL CITRATE (PF) 100 MCG/2ML IJ SOLN
25.0000 ug | INTRAMUSCULAR | Status: DC | PRN
  Administered 2020-11-18: 50 ug via INTRAVENOUS

## 2020-11-18 MED ORDER — ROCURONIUM BROMIDE 10 MG/ML (PF) SYRINGE
PREFILLED_SYRINGE | INTRAVENOUS | Status: AC
  Filled 2020-11-18: qty 10

## 2020-11-18 MED ORDER — ACETAMINOPHEN 500 MG PO TABS
ORAL_TABLET | ORAL | Status: AC
  Administered 2020-11-18: 1000 mg via ORAL
  Filled 2020-11-18: qty 2

## 2020-11-18 MED ORDER — SODIUM CHLORIDE 0.9 % IV SOLN: 250.0000 mL | INTRAVENOUS | Status: DC

## 2020-11-18 MED ORDER — PROPOFOL 10 MG/ML IV BOLUS
INTRAVENOUS | Status: DC | PRN
  Administered 2020-11-18: 150 mg via INTRAVENOUS

## 2020-11-18 MED ORDER — SCOPOLAMINE 1 MG/3DAYS TD PT72
MEDICATED_PATCH | TRANSDERMAL | Status: AC
  Administered 2020-11-18: 1.5 mg via TRANSDERMAL
  Filled 2020-11-18: qty 1

## 2020-11-18 MED ORDER — SURGIFLO WITH THROMBIN (HEMOSTATIC MATRIX KIT) OPTIME
TOPICAL | Status: DC | PRN
  Administered 2020-11-18: 1 via TOPICAL

## 2020-11-18 MED ORDER — SCOPOLAMINE 1 MG/3DAYS TD PT72: 1.0000 | MEDICATED_PATCH | TRANSDERMAL | Status: DC

## 2020-11-18 MED ORDER — ACETAMINOPHEN 500 MG PO TABS: 1000.0000 mg | ORAL_TABLET | Freq: Once | ORAL | Status: AC

## 2020-11-18 MED ORDER — EPHEDRINE SULFATE-NACL 50-0.9 MG/10ML-% IV SOSY
PREFILLED_SYRINGE | INTRAVENOUS | Status: DC | PRN
  Administered 2020-11-18 (×3): 5 mg via INTRAVENOUS
  Administered 2020-11-18: 10 mg via INTRAVENOUS

## 2020-11-18 MED ORDER — 0.9 % SODIUM CHLORIDE (POUR BTL) OPTIME
TOPICAL | Status: DC | PRN
  Administered 2020-11-18: 1000 mL

## 2020-11-18 MED ORDER — DULOXETINE HCL 30 MG PO CPEP
60.0000 mg | ORAL_CAPSULE | Freq: Every day | ORAL | Status: DC
  Administered 2020-11-18: 60 mg via ORAL
  Filled 2020-11-18: qty 2

## 2020-11-18 MED ORDER — CHLORHEXIDINE GLUCONATE 0.12 % MT SOLN
OROMUCOSAL | Status: AC
  Administered 2020-11-18: 15 mL via OROMUCOSAL
  Filled 2020-11-18: qty 15

## 2020-11-18 MED ORDER — ONDANSETRON HCL 4 MG/2ML IJ SOLN
INTRAMUSCULAR | Status: AC
  Filled 2020-11-18: qty 2

## 2020-11-18 MED ORDER — CEFAZOLIN SODIUM-DEXTROSE 2-4 GM/100ML-% IV SOLN
INTRAVENOUS | Status: AC
  Filled 2020-11-18: qty 100

## 2020-11-18 MED ORDER — PROPOFOL 10 MG/ML IV BOLUS
INTRAVENOUS | Status: AC
  Filled 2020-11-18: qty 20

## 2020-11-18 MED ORDER — OXYCODONE-ACETAMINOPHEN 5-325 MG PO TABS: 1.0000 | ORAL_TABLET | ORAL | 0 refills | Status: DC | PRN

## 2020-11-18 MED ORDER — METHOCARBAMOL 500 MG PO TABS
500.0000 mg | ORAL_TABLET | Freq: Four times a day (QID) | ORAL | Status: DC | PRN
  Administered 2020-11-18 (×2): 500 mg via ORAL
  Filled 2020-11-18 (×2): qty 1

## 2020-11-18 MED ORDER — ORAL CARE MOUTH RINSE: 15.0000 mL | Freq: Once | OROMUCOSAL | Status: AC

## 2020-11-18 MED ORDER — ROCURONIUM BROMIDE 10 MG/ML (PF) SYRINGE
PREFILLED_SYRINGE | INTRAVENOUS | Status: DC | PRN
  Administered 2020-11-18: 20 mg via INTRAVENOUS
  Administered 2020-11-18: 70 mg via INTRAVENOUS

## 2020-11-18 MED ORDER — OXYCODONE HCL 5 MG PO TABS: 5.0000 mg | ORAL_TABLET | Freq: Once | ORAL | Status: DC | PRN

## 2020-11-18 MED ORDER — ONDANSETRON HCL 4 MG/2ML IJ SOLN
INTRAMUSCULAR | Status: DC | PRN
  Administered 2020-11-18: 4 mg via INTRAVENOUS

## 2020-11-18 MED ORDER — DEXAMETHASONE SODIUM PHOSPHATE 10 MG/ML IJ SOLN
INTRAMUSCULAR | Status: DC | PRN
  Administered 2020-11-18: 10 mg via INTRAVENOUS

## 2020-11-18 MED ORDER — OXYCODONE HCL 5 MG PO TABS
5.0000 mg | ORAL_TABLET | ORAL | Status: DC | PRN
  Administered 2020-11-18: 5 mg via ORAL
  Filled 2020-11-18: qty 1

## 2020-11-18 MED ORDER — ACETAMINOPHEN 325 MG PO TABS
650.0000 mg | ORAL_TABLET | ORAL | Status: DC | PRN
  Administered 2020-11-18: 650 mg via ORAL
  Filled 2020-11-18: qty 2

## 2020-11-18 MED ORDER — ACETAMINOPHEN 650 MG RE SUPP: 650.0000 mg | RECTAL | Status: DC | PRN

## 2020-11-18 MED ORDER — FENTANYL CITRATE (PF) 250 MCG/5ML IJ SOLN
INTRAMUSCULAR | Status: AC
  Filled 2020-11-18: qty 5

## 2020-11-18 MED ORDER — LIDOCAINE 2% (20 MG/ML) 5 ML SYRINGE
INTRAMUSCULAR | Status: DC | PRN
  Administered 2020-11-18: 40 mg via INTRAVENOUS

## 2020-11-18 MED ORDER — HYDROMORPHONE HCL 1 MG/ML IJ SOLN: 0.5000 mg | INTRAMUSCULAR | Status: DC | PRN

## 2020-11-18 MED ORDER — PHENOL 1.4 % MT LIQD: 1.0000 | OROMUCOSAL | Status: DC | PRN

## 2020-11-18 MED ORDER — DEXAMETHASONE SODIUM PHOSPHATE 10 MG/ML IJ SOLN
INTRAMUSCULAR | Status: AC
  Filled 2020-11-18: qty 1

## 2020-11-18 MED ORDER — OXYCODONE HCL 5 MG/5ML PO SOLN: 5.0000 mg | Freq: Once | ORAL | Status: DC | PRN

## 2020-11-18 MED ORDER — MENTHOL 3 MG MT LOZG: 1.0000 | LOZENGE | OROMUCOSAL | Status: DC | PRN

## 2020-11-18 MED ORDER — ONDANSETRON HCL 4 MG PO TABS: 4.0000 mg | ORAL_TABLET | Freq: Four times a day (QID) | ORAL | Status: DC | PRN

## 2020-11-18 MED ORDER — SODIUM CHLORIDE 0.9 % IV SOLN: INTRAVENOUS | Status: DC

## 2020-11-18 MED ORDER — CHLORHEXIDINE GLUCONATE 0.12 % MT SOLN: 15.0000 mL | Freq: Once | OROMUCOSAL | Status: AC

## 2020-11-18 MED ORDER — POLYETHYLENE GLYCOL 3350 17 G PO PACK: 17.0000 g | PACK | Freq: Every day | ORAL | Status: DC

## 2020-11-18 MED ORDER — BUPIVACAINE-EPINEPHRINE 0.5% -1:200000 IJ SOLN
INTRAMUSCULAR | Status: DC | PRN
  Administered 2020-11-18: 6 mL

## 2020-11-18 MED ORDER — METHOCARBAMOL 500 MG PO TABS: 500.0000 mg | ORAL_TABLET | Freq: Four times a day (QID) | ORAL | 0 refills | Status: DC | PRN

## 2020-11-18 SURGICAL SUPPLY — 58 items
AGENT HMST KT MTR STRL THRMB (HEMOSTASIS) ×1
APL SKNCLS STERI-STRIP NONHPOA (GAUZE/BANDAGES/DRESSINGS) ×1
BAG COUNTER SPONGE SURGICOUNT (BAG) ×2 IMPLANT
BAG SPNG CNTER NS LX DISP (BAG) ×1
BAG SURGICOUNT SPONGE COUNTING (BAG) ×1
BENZOIN TINCTURE PRP APPL 2/3 (GAUZE/BANDAGES/DRESSINGS) ×3 IMPLANT
BIT DRILL SM SPINE QC 12 (BIT) ×2 IMPLANT
BLADE CLIPPER SURG (BLADE) IMPLANT
BUR ROUND FLUTED 4 SOFT TCH (BURR) ×2 IMPLANT
BUR ROUND FLUTED 4MM SOFT TCH (BURR) ×1
CLOSURE WOUND 1/2 X4 (GAUZE/BANDAGES/DRESSINGS) ×1
COLLAR CERV LO CONTOUR FIRM DE (SOFTGOODS) ×3 IMPLANT
COVER MAYO STAND STRL (DRAPES) ×7 IMPLANT
COVER SURGICAL LIGHT HANDLE (MISCELLANEOUS) ×3 IMPLANT
DRAPE C-ARM 42X72 X-RAY (DRAPES) ×3 IMPLANT
DRAPE HALF SHEET 40X57 (DRAPES) ×3 IMPLANT
DRAPE MICROSCOPE LEICA (MISCELLANEOUS) ×3 IMPLANT
DURAPREP 6ML APPLICATOR 50/CS (WOUND CARE) ×3 IMPLANT
ELECT COATED BLADE 2.86 ST (ELECTRODE) ×3 IMPLANT
ELECT REM PT RETURN 9FT ADLT (ELECTROSURGICAL) ×3
ELECTRODE REM PT RTRN 9FT ADLT (ELECTROSURGICAL) ×1 IMPLANT
EVACUATOR 1/8 PVC DRAIN (DRAIN) ×1 IMPLANT
GAUZE SPONGE 4X4 12PLY STRL (GAUZE/BANDAGES/DRESSINGS) ×3 IMPLANT
GLOVE SRG 8 PF TXTR STRL LF DI (GLOVE) ×2 IMPLANT
GLOVE SURG ORTHO LTX SZ7.5 (GLOVE) ×6 IMPLANT
GLOVE SURG UNDER POLY LF SZ8 (GLOVE) ×6
GOWN STRL REUS W/ TWL LRG LVL3 (GOWN DISPOSABLE) ×1 IMPLANT
GOWN STRL REUS W/ TWL XL LVL3 (GOWN DISPOSABLE) ×1 IMPLANT
GOWN STRL REUS W/TWL 2XL LVL3 (GOWN DISPOSABLE) ×3 IMPLANT
GOWN STRL REUS W/TWL LRG LVL3 (GOWN DISPOSABLE) ×3
GOWN STRL REUS W/TWL XL LVL3 (GOWN DISPOSABLE) ×3
HALTER HD/CHIN CERV TRACTION D (MISCELLANEOUS) ×3 IMPLANT
KIT BASIN OR (CUSTOM PROCEDURE TRAY) ×3 IMPLANT
KIT TURNOVER KIT B (KITS) ×3 IMPLANT
MANIFOLD NEPTUNE II (INSTRUMENTS) ×1 IMPLANT
NDL 25GX 5/8IN NON SAFETY (NEEDLE) ×1 IMPLANT
NEEDLE 25GX 5/8IN NON SAFETY (NEEDLE) ×3 IMPLANT
NS IRRIG 1000ML POUR BTL (IV SOLUTION) ×3 IMPLANT
PACK ORTHO CERVICAL (CUSTOM PROCEDURE TRAY) ×3 IMPLANT
PAD ARMBOARD 7.5X6 YLW CONV (MISCELLANEOUS) ×6 IMPLANT
PATTIES SURGICAL .5 X.5 (GAUZE/BANDAGES/DRESSINGS) ×2 IMPLANT
PIN FIXATION TEMP W/SHOULDER (PIN) ×2 IMPLANT
PLATE ANT CERV XTEND 1 LV 16 (Plate) ×2 IMPLANT
POSITIONER HEAD DONUT 9IN (MISCELLANEOUS) ×3 IMPLANT
SCREW XTD VAR 4.2 SELF TAP 12 (Screw) ×8 IMPLANT
SPACER ASSEM CERV LORD 9M (Spacer) ×2 IMPLANT
SPONGE T-LAP 18X18 ~~LOC~~+RFID (SPONGE) ×2 IMPLANT
SPONGE T-LAP 4X18 ~~LOC~~+RFID (SPONGE) ×2 IMPLANT
STRIP CLOSURE SKIN 1/2X4 (GAUZE/BANDAGES/DRESSINGS) ×2 IMPLANT
SURGIFLO W/THROMBIN 8M KIT (HEMOSTASIS) ×2 IMPLANT
SUT BONE WAX W31G (SUTURE) ×3 IMPLANT
SUT VIC AB 3-0 PS2 18 (SUTURE) ×3
SUT VIC AB 3-0 PS2 18XBRD (SUTURE) ×1 IMPLANT
SUT VIC AB 4-0 PS2 27 (SUTURE) ×3 IMPLANT
SYR BULB EAR ULCER 3OZ GRN STR (SYRINGE) ×1 IMPLANT
TOWEL GREEN STERILE (TOWEL DISPOSABLE) ×3 IMPLANT
TOWEL GREEN STERILE FF (TOWEL DISPOSABLE) ×3 IMPLANT
WATER STERILE IRR 1000ML POUR (IV SOLUTION) ×3 IMPLANT

## 2020-11-18 NOTE — H&P (Signed)
Robin Steele is an 55 y.o. female.   Chief Complaint: neck pain, UE radiculopathy and unsteady gait. HPI: 55 year old white female history of C4-5: Broad-based disc bulge with a central/left paracentral disc protrusion with mass effect on the cervical spinal cord with associated cord edema,Bilateral uncovertebral degenerative change, Severe bilateral foraminal stenosis and Severe spinal stenosis comes in for preop evaluation.  Patient also continues have ongoing neck pain and upper extremity radiculopathy with feeling of unsteady gait.  States that she is wanting to proceed with C4-5 ACDF is scheduled.  Today history and physical performed.  Review of systems negative  Past Medical History:  Diagnosis Date   Anxiety    Arthritis 2011   in neck and back   Cancer ([place]) 2022   skin cancer, 2 areas removed per pt   Depression    Hyperlipidemia    Pre-diabetes 2022   borderline, hgba1c 5.7 in august 2022    Past Surgical History:  Procedure Laterality Date   BASAL CELL CARCINOMA EXCISION  2022   pt has had areas removed in the past as well   HERNIA REPAIR     pt states she had an umbilical hernia repair about 25 years ago (around 1997)    Family History  Problem Relation Age of Onset   Breast cancer Maternal Aunt    Social History:  reports that she has never smoked. She has never used smokeless tobacco. She reports current alcohol use. She reports that she does not use drugs.  Allergies: No Known Allergies  Medications Prior to Admission  Medication Sig Dispense Refill   Ascorbic Acid (VITAMIN C) 1000 MG tablet Take 1,000 mg by mouth at bedtime.     Cyanocobalamin (B-12) 5000 MCG CAPS Take 5,000 mcg by mouth at bedtime.     DULoxetine (CYMBALTA) 60 MG capsule Take 60 mg by mouth at bedtime.     Magnesium 250 MG TABS Take 250 mg by mouth at bedtime.     Multiple Vitamins-Minerals (CENTRUM SILVER 50+WOMEN) TABS Take 1 tablet by mouth at bedtime.     Omega-3 1000 MG CAPS Take  1,000 mg by mouth at bedtime.     Red Yeast Rice 600 MG TABS Take 1,200 mg by mouth at bedtime.     temazepam (RESTORIL) 30 MG capsule Take 30 mg by mouth at bedtime.     valACYclovir (VALTREX) 500 MG tablet Take 500 mg by mouth at bedtime.     zinc gluconate 50 MG tablet Take 50 mg by mouth at bedtime.      Results for orders placed or performed during the hospital encounter of 11/18/20 (from the past 48 hour(s))  Glucose, capillary     Status: None   Collection Time: 11/18/20  6:10 AM  Result Value Ref Range   Glucose-Capillary 99 70 - 99 mg/dL    Comment: Glucose reference range applies only to samples taken after fasting for at least 8 hours.   No results found.  Review of Systems  Constitutional:  Positive for activity change.  HENT: Negative.    Respiratory: Negative.    Cardiovascular: Negative.   Gastrointestinal: Negative.   Genitourinary: Negative.   Musculoskeletal:  Positive for gait problem and neck pain.  Neurological:  Positive for weakness and numbness.   Blood pressure (!) 168/77, pulse 82, temperature 98.7 F (37.1 C), temperature source Oral, resp. rate 17, height 5\' 5"  (1.651 m), weight 67.6 kg, last menstrual period 12/18/2018, SpO2 98 %. Physical Exam Constitutional:  Appearance: Normal appearance.  HENT:     Head: Normocephalic and atraumatic.     Nose: Nose normal.  Eyes:     Extraocular Movements: Extraocular movements intact.     Pupils: Pupils are equal, round, and reactive to light.  Cardiovascular:     Rate and Rhythm: Regular rhythm.     Heart sounds: Normal heart sounds.  Pulmonary:     Effort: Pulmonary effort is normal. No respiratory distress.     Breath sounds: Normal breath sounds.  Abdominal:     General: There is no distension.  Musculoskeletal:        General: Tenderness present.  Neurological:     Mental Status: She is alert and oriented to person, place, and time.     Assessment/Plan C4-5 HNP/stenosis  We will proceed  with C4-5 ACDF as scheduled.  Surgical procedure discussed in great detail along with potential rehab/recovery time.  All questions answered.   Benjiman Core, PA-C 11/18/2020, 6:51 AM

## 2020-11-18 NOTE — Transfer of Care (Signed)
Immediate Anesthesia Transfer of Care Note  Patient: Robin Steele  Procedure(s) Performed: CERVICAL FOUR -CERVICAL FIVE  ANTERIOR CERVICAL DISCECTOMY FUSION, ALLOGRAFT, PLATE  Patient Location: PACU  Anesthesia Type:General  Level of Consciousness: drowsy, patient cooperative and responds to stimulation  Airway & Oxygen Therapy: Patient Spontanous Breathing and Patient connected to nasal cannula oxygen  Post-op Assessment: Report given to RN, Post -op Vital signs reviewed and stable and Patient moving all extremities X 4  Post vital signs: Reviewed and stable  Last Vitals:  Vitals Value Taken Time  BP    Temp    Pulse    Resp    SpO2      Last Pain:  Vitals:   11/18/20 0604  TempSrc:   PainSc: 1          Complications: No notable events documented.

## 2020-11-18 NOTE — Discharge Instructions (Addendum)
Ok to shower 1 days postop. Cervical collar must be on at all times INCLUDING when showering. Use the extra collar wrapped in saran wrap when you take a shower. Do not apply any creams or ointments to incision.  Do not remove steri-strips.  Can use 4x4 gauze and tape for dressing changes if your dressing came off. You can leave it on until you return to see Dr. Lorin Mercy in one week in the [facility].   No aggressive activity.   No lifting, pushing, pulling.   Do not bend or turn neck.     No driving.   If you have increased pain, or any issues with swallowing, breathing you should contact our office immediately for instructions or go the emergency room.

## 2020-11-18 NOTE — Anesthesia Procedure Notes (Addendum)
Procedure Name: Intubation Date/Time: 11/18/2020 7:36 AM Performed by: Michele Rockers, CRNA Pre-anesthesia Checklist: Patient identified, Patient being monitored, Timeout performed, Emergency Drugs available and Suction available Patient Re-evaluated:Patient Re-evaluated prior to induction Oxygen Delivery Method: Circle System Utilized Preoxygenation: Pre-oxygenation with 100% oxygen Induction Type: IV induction Ventilation: Mask ventilation without difficulty Laryngoscope Size: Mac, 3 and Glidescope Grade View: Grade I Tube type: Oral Tube size: 7.0 mm Number of attempts: 1 Airway Equipment and Method: Stylet Placement Confirmation: ETT inserted through vocal cords under direct vision, positive ETCO2 and breath sounds checked- equal and bilateral Secured at: 21 cm Tube secured with: Tape Dental Injury: Teeth and Oropharynx as per pre-operative assessment

## 2020-11-18 NOTE — Interval H&P Note (Signed)
History and Physical Interval Note:  11/18/2020 7:21 AM  Robin Steele  has presented today for surgery, with the diagnosis of C4-5 stenosis, cord compression, myelopathy.  The various methods of treatment have been discussed with the patient and family. After consideration of risks, benefits and other options for treatment, the patient has consented to  Procedure(s): C4-5 ANTERIOR CERVICAL DISCECTOMY FUSION, ALLOGRAFT, PLATE (N/A) as a surgical intervention.  The patient's history has been reviewed, patient examined, no change in status, stable for surgery.  I have reviewed the patient's chart and labs.  Questions were answered to the patient's satisfaction.     Marybelle Killings

## 2020-11-18 NOTE — Op Note (Signed)
Preop diagnosis: C4-5 disc protrusion with cord edema, myelopathy and severe spinal stenosis  Postop diagnosis: Same  Procedure: C4-5 anterior cervical discectomy and fusion, allograft and plate.  Surgeon: Rodell Perna, MD  Assistant: Benjiman Core, PA-C medically necessary and present for the entire procedure  Anesthesia General +6 cc Marcaine.  Drains 1 Hemovac neck.  Implants:Implants  SPACER ASSEM CERV LORD 75M - [id number]  Inventory Item: SPACER ASSEM CERV LORD 75M Serial no.: [id number] Model/Cat no.: [id number]  Implant name: SPACER ASSEM CERV LORD 75M - [id number] Laterality: N/A Area: Cervical Level 4-5  Manufacturer: [employer] Date of Manufacture:    Action: Implanted Number Used: 1   Device Identifier:  Device Identifier Type:     PLATE ANT CERV XTEND 1 LV 16 - [id number]  Inventory Item: PLATE ANT CERV XTEND 1 LV 16 Serial no.:  Model/Cat no.: [id number]  Implant name: PLATE ANT CERV XTEND 1 LV 16 - [id number] Laterality: N/A Area: Cervical Level 4-5  Manufacturer: [employer] Date of Manufacture:    Action: Implanted Number Used: 1   Device Identifier:  Device Identifier Type:     SCREW XTD VAR 4.2 SELF TAP 12 - [id number]  Inventory Item: SCREW XTD VAR 4.2 SELF TAP 12 Serial no.:  Model/Cat no.: [id number]  Implant name: SCREW XTD VAR 4.2 SELF TAP 12 - [id number] Laterality: N/A Area: Cervical Level 4-5  Manufacturer: [place] Date of Manufacture:    Action: Implanted Number Used: 4   Device Identifier:  Device Identifier Type:    Procedure: After induction general anesthesia intubation with a glide scope the patient is cord edema myelopathy wrist recurrent restraints were applied but not used or needed with traction due to the C4-5 level patient's then neck.  Prepped with DuraPrep after had ultra traction without weight was applied.  Sterile skin marker area squared with towels Betadine Steri-Drape sterile Mayo stand the head and thyroid sheets and  drapes.  Incision was made midline extending to the left based on palpable landmarks.  Platysma divided in line with the skin incision small bleeders coagulated with bipolar cautery.  Prominent spur at C4-5 in short 25 needle placed confirmed appropriate level.  Self-retaining plate retractors with teeth right and left smooth blade cephalad caudad.  Discectomy was performed and operative microscope was brought in we progressed back to the posterior longitudinal ligament removing a large chunk of disc that it extruded through the ligament.  Complete take down the posterior longitudinal ligament and we actually enlarged the area and ended up using a 9 mm lordotic graft.  We did open up additional area to make sure we had complete decompression of the cord due to the patient's myelopathy and cord compression.  Once graft was inserted countersunk 2 mm with gentle traction pulled by CRNA after complete decompression with cord plate was selected globus medical with 12 mm screws and 47mm 1 level plate.  Screws were placed checked under C arm locked down with tiny screwdriver.  Hemovac placed with and out technique using the trocar on the left in line with the skin incision.  Some Surgi-Flo was used in the epidural space prior to placing the graft but the epidural space was dry at the time of graft placement.  Copious irrigation closure platysma 3-0 Vicryl 4-0 Vicryl subcuticular closure tincture benzoin Steri-Strips 4 x 4 tape and soft collar was applied patient tolerated procedure well transferred recovery in stable condition.

## 2020-11-18 NOTE — Anesthesia Postprocedure Evaluation (Signed)
Anesthesia Post Note  Patient: Georgeann Brinkman  Procedure(s) Performed: CERVICAL FOUR -CERVICAL FIVE  ANTERIOR CERVICAL DISCECTOMY FUSION, ALLOGRAFT, PLATE     Patient location during evaluation: PACU Anesthesia Type: General Level of consciousness: awake and alert Pain management: pain level controlled Vital Signs Assessment: post-procedure vital signs reviewed and stable Respiratory status: spontaneous breathing, nonlabored ventilation, respiratory function stable and patient connected to nasal cannula oxygen Cardiovascular status: blood pressure returned to baseline and stable Postop Assessment: no apparent nausea or vomiting Anesthetic complications: no   No notable events documented.  Last Vitals:  Vitals:   11/18/20 1020 11/18/20 1043  BP: (!) 169/84 (!) 148/90  Pulse: 96 82  Resp: 16 18  Temp: 36.5 C 36.7 C  SpO2: 94% 97%    Last Pain:  Vitals:   11/18/20 1043  TempSrc: Oral  PainSc:                  Merlinda Frederick

## 2020-11-19 DIAGNOSIS — M4802 Spinal stenosis, cervical region: Secondary | ICD-10-CM | POA: Diagnosis not present

## 2020-11-19 NOTE — Progress Notes (Signed)
Patient alert and oriented, voiding adequately, skin clean, dry and intact without evidence of skin break down, or symptoms of complications - no redness or edema noted, only slight tenderness at site.  Patient states pain is manageable at time of discharge. Patient has an appointment with MD in 1 week 

## 2020-11-19 NOTE — Progress Notes (Signed)
No events.  Patient stable. Did well with OT. Ready to go home. D/c order placed.

## 2020-11-19 NOTE — Discharge Summary (Signed)
Patient ID: Robin Steele MRN: [id number] DOB/AGE: January 03, 1966 55 y.o.  Admit date: 11/18/2020 Discharge date: 11/19/2020  Admission Diagnoses:  <principal problem not specified>  Discharge Diagnoses:  Active Problems:   Spinal stenosis of cervical region   S/P cervical spinal fusion   Past Medical History:  Diagnosis Date   Anxiety    Arthritis 2011   in neck and back   Cancer ([place]) 2022   skin cancer, 2 areas removed per pt   Depression    Hyperlipidemia    Pre-diabetes 2022   borderline, hgba1c 5.7 in august 2022    Surgeries: Procedure(s): CERVICAL FOUR -CERVICAL FIVE  ANTERIOR CERVICAL DISCECTOMY FUSION, ALLOGRAFT, PLATE on 39/03/90   Consultants (if any):   Discharged Condition: Improved  Hospital Course: Robin Steele is an 55 y.o. female who was admitted 11/18/2020 with a diagnosis of <principal problem not specified> and went to the operating room on 11/18/2020 and underwent the above named procedures.    She was given perioperative antibiotics:  Anti-infectives (From admission, onward)    Start     Dose/Rate Route Frequency Ordered Stop   11/18/20 0700  ceFAZolin (ANCEF) IVPB 2g/100 mL premix        2 g 200 mL/hr over 30 Minutes Intravenous On call to O.R. 11/18/20 0645 11/18/20 0800   11/18/20 0559  ceFAZolin (ANCEF) 2-4 GM/100ML-% IVPB       Note to Pharmacy: Jeralene Huff   : cabinet override      11/18/20 0559 11/18/20 0758     .  She was given sequential compression devices, early ambulation, and appropriate chemoprophylaxis for DVT prophylaxis.  She benefited maximally from the hospital stay and there were no complications.    Recent vital signs:  Vitals:   11/19/20 0404 11/19/20 0730  BP: (!) 152/87 (!) 148/83  Pulse: 80 70  Resp: 18 18  Temp: 98.2 F (36.8 C) 98 F (36.7 C)  SpO2: 99% 100%    Recent laboratory studies:  Lab Results  Component Value Date   HGB 14.3 11/17/2020   Lab Results  Component Value Date    WBC 6.6 11/17/2020   PLT 315 11/17/2020   No results found for: INR Lab Results  Component Value Date   NA 139 11/17/2020   K 4.0 11/17/2020   CL 104 11/17/2020   CO2 29 11/17/2020   BUN 12 11/17/2020   CREATININE 1.02 (H) 11/17/2020   GLUCOSE 107 (H) 11/17/2020    Discharge Medications:   Allergies as of 11/19/2020   No Known Allergies      Medication List     TAKE these medications    B-12 5000 MCG Caps Take 5,000 mcg by mouth at bedtime.   Centrum Silver 50+Women Tabs Take 1 tablet by mouth at bedtime.   DULoxetine 60 MG capsule Commonly known as: CYMBALTA Take 60 mg by mouth at bedtime.   Magnesium 250 MG Tabs Take 250 mg by mouth at bedtime.   methocarbamol 500 MG tablet Commonly known as: Robaxin Take 1 tablet (500 mg total) by mouth every 6 (six) hours as needed for muscle spasms.   Omega-3 1000 MG Caps Take 1,000 mg by mouth at bedtime.   oxyCODONE-acetaminophen 5-325 MG tablet Commonly known as: PERCOCET/ROXICET Take 1 tablet by mouth every 4 (four) hours as needed for severe pain.   Red Yeast Rice 600 MG Tabs Take 1,200 mg by mouth at bedtime.   temazepam 30 MG capsule Commonly known as:  RESTORIL Take 30 mg by mouth at bedtime.   valACYclovir 500 MG tablet Commonly known as: VALTREX Take 500 mg by mouth at bedtime.   vitamin C 1000 MG tablet Take 1,000 mg by mouth at bedtime.   zinc gluconate 50 MG tablet Take 50 mg by mouth at bedtime.        Diagnostic Studies: DG Cervical Spine 2 or 3 views  Result Date: 11/18/2020 CLINICAL DATA:  Fluoroscopic assistance for cervical spine surgery EXAM: CERVICAL SPINE - 2-3 VIEW COMPARISON:  MR cervical spine done on 11/11/2020 FINDINGS: Fluoroscopic images show anterior surgical fusion at C4-C5 level. Intervertebral disc spacer is noted in place. Fluoroscopic time was 15 seconds. Radiation dose is 1.96 mGy. Two images are submitted for review. IMPRESSION: Fluoroscopic assistance was provided  for anterior surgical fusion at C4-C5 level. Electronically Signed   By: Elmer Picker M.D.   On: 11/18/2020 09:45   MR Cervical Spine w/o contrast  Result Date: 11/11/2020 CLINICAL DATA:  Myelopathy, tingling in the upper extremity with weakness EXAM: MRI CERVICAL SPINE WITHOUT CONTRAST TECHNIQUE: Multiplanar, multisequence MR imaging of the cervical spine was performed. No intravenous contrast was administered. COMPARISON:  None. FINDINGS: Alignment: Physiologic. Vertebrae: No acute fracture, evidence of discitis, or bone lesion. Cord: Mild cord edema in the cervical spinal cord at the level of C4-5. Posterior Fossa, vertebral arteries, paraspinal tissues: Posterior fossa demonstrates no focal abnormality. Vertebral artery flow voids are maintained. Paraspinal soft tissues are unremarkable. Disc levels: Discs: Degenerative disease with disc height loss at C5-6 and C6-7. C2-3: No disc protrusion. Mild bilateral facet arthropathy, right worse than left. Moderate right foraminal stenosis. No left foraminal stenosis. C3-4: No disc protrusion. Severe left facet arthropathy. Left uncovertebral degenerative changes. Severe left foraminal stenosis. Mild right foraminal stenosis. No central canal stenosis. C4-5: Broad-based disc bulge with a central/left paracentral disc protrusion with mass effect on the cervical spinal cord with associated cord edema. Bilateral uncovertebral degenerative changes. Severe bilateral foraminal stenosis. Severe spinal stenosis. C5-6: Broad-based disc bulge. Bilateral uncovertebral degenerative changes. Severe bilateral foraminal stenosis. Mild spinal stenosis. C6-7: Broad-based disc bulge with a small central disc protrusion. Bilateral uncovertebral degenerative changes. Severe left foraminal stenosis. Mild right foraminal stenosis. No spinal stenosis. C7-T1: No significant disc bulge. No neural foraminal stenosis. No central canal stenosis. IMPRESSION: 1. Diffuse cervical spine  spondylosis as described above. 2. At C4-5 there is a broad-based disc bulge with a central/left paracentral disc protrusion with mass effect on the cervical spinal cord with associated cord edema. Bilateral uncovertebral degenerative changes. Severe bilateral foraminal stenosis. Severe spinal stenosis. 3.  No acute osseous injury of the cervical spine. Electronically Signed   By: Kathreen Devoid M.D.   On: 11/11/2020 12:16   DG C-Arm 1-60 Min-No Report  Result Date: 11/18/2020 Fluoroscopy was utilized by the requesting physician.  No radiographic interpretation.   DG C-Arm 1-60 Min-No Report  Result Date: 11/18/2020 Fluoroscopy was utilized by the requesting physician.  No radiographic interpretation.   XR Cervical Spine 2 or 3 views  Result Date: 11/10/2020 AP lateral cervical spine images are obtained and reviewed.  This shows facet enlargement C3-4 C4-5 C5-6 worse on the left than right.  Uncovertebral degenerative changes straightening of the cervical spine and anterior posterior spurring worse at C6-7 and C4-5 and C5-6. Impression.  Cervical spondylosis with posterior endplate spurring.   Disposition: Discharge disposition: 01-Home or Self Care       Discharge Instructions     Call MD /  Call 911   Complete by: As directed    If you experience chest pain or shortness of breath, CALL 911 and be transported to the hospital emergency room.  If you develope a fever above 101.5 F, pus (white drainage) or increased drainage or redness at the wound, or calf pain, call your surgeon's office.   Constipation Prevention   Complete by: As directed    Drink plenty of fluids.  Prune juice may be helpful.  You may use a stool softener, such as Colace (over the counter) 100 mg twice a day.  Use MiraLax (over the counter) for constipation as needed.   Driving restrictions   Complete by: As directed    No driving while taking narcotic pain meds.   Incentive spirometry RT   Complete by: As  directed    Increase activity slowly as tolerated   Complete by: As directed    Post-operative opioid taper instructions:   Complete by: As directed    POST-OPERATIVE OPIOID TAPER INSTRUCTIONS: It is important to wean off of your opioid medication as soon as possible. If you do not need pain medication after your surgery it is ok to stop day one. Opioids include: Codeine, Hydrocodone(Norco, Vicodin), Oxycodone(Percocet, oxycontin) and hydromorphone amongst others.  Long term and even short term use of opiods can cause: Increased pain response Dependence Constipation Depression Respiratory depression And more.  Withdrawal symptoms can include Flu like symptoms Nausea, vomiting And more Techniques to manage these symptoms Hydrate well Eat regular healthy meals Stay active Use relaxation techniques(deep breathing, meditating, yoga) Do Not substitute Alcohol to help with tapering If you have been on opioids for less than two weeks and do not have pain than it is ok to stop all together.  Plan to wean off of opioids This plan should start within one week post op of your joint replacement. Maintain the same interval or time between taking each dose and first decrease the dose.  Cut the total daily intake of opioids by one tablet each day Next start to increase the time between doses. The last dose that should be eliminated is the evening dose.           Follow-up Information     Marybelle Killings, MD. Schedule an appointment as soon as possible for a visit today.   Specialty: Orthopedic Surgery Why: need return office visit one week postop. Contact information: [address] [place] [address] [phone]                  Signed: Eduard Roux 11/19/2020, 9:16 AM

## 2020-11-19 NOTE — Evaluation (Signed)
Occupational Therapy Evaluation Patient Details Name: Robin Steele MRN: [id number] DOB: 11-29-1965 Today's Date: 11/19/2020   History of Present Illness Pt is a 55 y/o female presenting on 11/11 with neck pain, UE radiculopathy and unsteady gait.  11/11 S/P C4-5 ACDF. PMH includes: anxiety, arthritis, skin cancer, depression, hernia repair.   Clinical Impression   PTA patient independent. Admitted for above and presenting with cervical precautions and pain.  She was educated on precautions, brace mgmt and wear schedule, activity progression, ADL compensatory techniques and safety. She is completing all self care, transfers and mobility in room with modified independence (after education on techniques). She reports she will have good support initially at dc.  No further questions or concerns.  No further OT needs identified at this time. OT will sign off. Thank you for this referral.       Recommendations for follow up therapy are one component of a multi-disciplinary discharge planning process, led by the attending physician.  Recommendations may be updated based on patient status, additional functional criteria and insurance authorization.   Follow Up Recommendations  No OT follow up    Assistance Recommended at Discharge Intermittent Supervision/Assistance  Functional Status Assessment     Equipment Recommendations  None recommended by OT    Recommendations for Other Services       Precautions / Restrictions Precautions Precautions: Cervical Precaution Booklet Issued: Yes (comment) Precaution Comments: reviewed with pt Required Braces or Orthoses: Cervical Brace Cervical Brace: Soft collar;At all times Restrictions Weight Bearing Restrictions: No      Mobility Bed Mobility Overal bed mobility: Modified Independent             General bed mobility comments: educated on log roll technique    Transfers Overall transfer level: Modified independent                         Balance Overall balance assessment: No apparent balance deficits (not formally assessed)                                         ADL either performed or assessed with clinical judgement   ADL Overall ADL's : Modified independent                                       General ADL Comments: completing ADLs with modified independence, reviewed compensatory tehcniques, safety and recommendations in regards to cervical precautions     Vision         Perception     Praxis      Pertinent Vitals/Pain Pain Assessment: 0-10 Pain Score: 2  Pain Descriptors / Indicators: Discomfort;Operative site guarding Pain Intervention(s): Limited activity within patient's tolerance;Monitored during session;Repositioned     Hand Dominance Right   Extremity/Trunk Assessment Upper Extremity Assessment Upper Extremity Assessment: Overall WFL for tasks assessed (within cervical precautions, pt with mild tingling in finger tips bilaterally but significantly improved since surgery)   Lower Extremity Assessment Lower Extremity Assessment: Overall WFL for tasks assessed   Cervical / Trunk Assessment Cervical / Trunk Assessment: Neck Surgery   Communication Communication Communication: No difficulties   Cognition Arousal/Alertness: Awake/alert Behavior During Therapy: WFL for tasks assessed/performed Overall Cognitive Status: Within Functional Limits for tasks assessed  General Comments       Exercises     Shoulder Instructions      Home Living Family/patient expects to be discharged to:: Private residence Living Arrangements: Alone Available Help at Discharge: Family;Available 24 hours/day (boyfriend and son for first few days home) Type of Home: House       Home Layout: One level     Bathroom Shower/Tub: Teacher, early years/pre: Standard     Home Equipment: None           Prior Functioning/Environment Prior Level of Function : Independent/Modified Independent                        OT Problem List: Pain;Decreased knowledge of precautions;Decreased knowledge of use of DME or AE;Decreased activity tolerance      OT Treatment/Interventions:      OT Goals(Current goals can be found in the care plan section) Acute Rehab OT Goals Patient Stated Goal: home OT Goal Formulation: With patient  OT Frequency:     Barriers to D/C:            Co-evaluation              AM-PAC OT "6 Clicks" Daily Activity     Outcome Measure Help from another person eating meals?: None Help from another person taking care of personal grooming?: None Help from another person toileting, which includes using toliet, bedpan, or urinal?: None Help from another person bathing (including washing, rinsing, drying)?: None Help from another person to put on and taking off regular upper body clothing?: None Help from another person to put on and taking off regular lower body clothing?: None 6 Click Score: 24   End of Session Equipment Utilized During Treatment: Cervical collar Nurse Communication: Mobility status;Other (comment) (needs shower collar)  Activity Tolerance: Patient tolerated treatment well Patient left: with call bell/phone within reach;in bed  OT Visit Diagnosis: Pain Pain - part of body:  (neck)                Time: 7619-5093 OT Time Calculation (min): 23 min Charges:  OT General Charges $OT Visit: 1 Visit OT Evaluation $OT Eval Low Complexity: 1 Low  Jolaine Artist, OT Acute Rehabilitation Services Pager [phone] Office [phone]   Delight Stare 11/19/2020, 10:37 AM

## 2020-11-19 NOTE — Progress Notes (Signed)
Orthopedic Tech Progress Note Patient Details:  Robin Steele 1965-05-30 [id number]   Ortho Devices Type of Ortho Device: Semi-rigid collar (soft collar) Ortho Device/Splint Location: Left with pt at bedside Ortho Device/Splint Interventions: Ordered   Post Interventions Patient Tolerated: Other (comment) (not applied as this is being sent home to use for showering.) Instructions Provided: Care of device  Donta Fuster Jeri Modena 11/19/2020, 11:55 AM

## 2020-11-24 ENCOUNTER — Ambulatory Visit (INDEPENDENT_AMBULATORY_CARE_PROVIDER_SITE_OTHER): Payer: Managed Care, Other (non HMO) | Admitting: Orthopaedic Surgery

## 2020-11-24 ENCOUNTER — Encounter (HOSPITAL_COMMUNITY): Payer: Self-pay | Admitting: Orthopaedic Surgery

## 2020-11-24 ENCOUNTER — Ambulatory Visit: Payer: Self-pay

## 2020-11-24 ENCOUNTER — Other Ambulatory Visit: Payer: Self-pay

## 2020-11-24 VITALS — Ht 65.0 in | Wt 149.0 lb

## 2020-11-24 DIAGNOSIS — Z981 Arthrodesis status: Secondary | ICD-10-CM

## 2020-11-24 NOTE — Progress Notes (Signed)
   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  Still has some tingling in the tip of her fingers thumb and index.  Arm strength she feels is back to normal gait is improved.  X-rays look good.  Return in 5 weeks.  Currently she was scheduled to go back December 23.  Chief Complaint:  Chief Complaint  Patient presents with   Neck - Routine Post Op    11/18/2020 C4-5 ACDF   Visit Diagnoses:  1. Status post cervical spinal fusion     Plan: Return in 5 weeks lateral flexion-extension C-spine x-ray on return.  Patient is very happy the surgical result.  Follow-Up Instructions: Return in about 5 weeks (around 12/29/2020).   Orders:  Orders Placed This Encounter  Procedures   XR Cervical Spine 2 or 3 views   No orders of the defined types were placed in this encounter.   Imaging: No results found.  PMFS History: Patient Active Problem List   Diagnosis Date Noted   S/P cervical spinal fusion 11/18/2020   Spinal stenosis of cervical region 11/10/2020   Past Medical History:  Diagnosis Date   Anxiety    Arthritis 2011   in neck and back   Cancer ([place]) 2022   skin cancer, 2 areas removed per pt   Depression    Hyperlipidemia    Pre-diabetes 2022   borderline, hgba1c 5.7 in august 2022    Family History  Problem Relation Age of Onset   Breast cancer Maternal Aunt     Past Surgical History:  Procedure Laterality Date   ANTERIOR CERVICAL DECOMP/DISCECTOMY FUSION N/A 11/18/2020   Procedure: CERVICAL FOUR -CERVICAL FIVE  ANTERIOR CERVICAL DISCECTOMY FUSION, ALLOGRAFT, PLATE;  Surgeon: Marybelle Killings, MD;  Location: [place];  Service: Orthopedics;  Laterality: N/A;   BASAL CELL CARCINOMA EXCISION  2022   pt has had areas removed in the past as well   HERNIA REPAIR     pt states she  had an umbilical hernia repair about 25 years ago (around 1997)   Social History   Occupational History   Not on file  Tobacco Use   Smoking status: Never   Smokeless tobacco: Never  Vaping Use   Vaping Use: Never used  Substance and Sexual Activity   Alcohol use: Yes    Comment: very occasional, maybe once or twice a year (1 glass of wine)   Drug use: Never   Sexual activity: Not on file

## 2020-12-14 ENCOUNTER — Ambulatory Visit: Payer: Managed Care, Other (non HMO) | Admitting: Cardiovascular Disease

## 2020-12-29 ENCOUNTER — Ambulatory Visit (INDEPENDENT_AMBULATORY_CARE_PROVIDER_SITE_OTHER): Payer: Managed Care, Other (non HMO)

## 2020-12-29 ENCOUNTER — Ambulatory Visit (INDEPENDENT_AMBULATORY_CARE_PROVIDER_SITE_OTHER): Payer: Managed Care, Other (non HMO) | Admitting: Orthopaedic Surgery

## 2020-12-29 ENCOUNTER — Other Ambulatory Visit: Payer: Self-pay

## 2020-12-29 ENCOUNTER — Encounter: Payer: Self-pay | Admitting: Orthopaedic Surgery

## 2020-12-29 VITALS — Ht 65.0 in | Wt 149.0 lb

## 2020-12-29 DIAGNOSIS — Z981 Arthrodesis status: Secondary | ICD-10-CM

## 2020-12-29 NOTE — Progress Notes (Deleted)
° °  Office Visit Note   Patient: Robin Steele           Date of Birth: Jun 01, 1965           MRN: [id number] Visit Date: 12/29/2020              Requested by: Rosalee Kaufman, PA-C [address],  [place] [address] PCP: Rosalee Kaufman, PA-C   Assessment & Plan: Visit Diagnoses:  1. Status post cervical spinal fusion     Plan: ***  Follow-Up Instructions: No follow-ups on file.   Orders:  Orders Placed This Encounter  Procedures   XR Cervical Spine 2 or 3 views   No orders of the defined types were placed in this encounter.     Procedures: No procedures performed   Clinical Data: No additional findings.   Subjective: Chief Complaint  Patient presents with   Neck - Follow-up    11/18/2020 C4-5 ACDF    HPI  Review of Systems   Objective: Vital Signs: Ht 5\' 5"  (1.651 m)    Wt 149 lb (67.6 kg)    LMP 12/18/2018    BMI 24.79 kg/m   Physical Exam  Ortho Exam  Specialty Comments:  No specialty comments available.  Imaging: No results found.   PMFS History: Patient Active Problem List   Diagnosis Date Noted   S/P cervical spinal fusion 11/18/2020   Spinal stenosis of cervical region 11/10/2020   Past Medical History:  Diagnosis Date   Anxiety    Arthritis 2011   in neck and back   Cancer ([place]) 2022   skin cancer, 2 areas removed per pt   Depression    Hyperlipidemia    Pre-diabetes 2022   borderline, hgba1c 5.7 in august 2022    Family History  Problem Relation Age of Onset   Breast cancer Maternal Aunt     Past Surgical History:  Procedure Laterality Date   ANTERIOR CERVICAL DECOMP/DISCECTOMY FUSION N/A 11/18/2020   Procedure: CERVICAL FOUR -CERVICAL FIVE  ANTERIOR CERVICAL DISCECTOMY FUSION, ALLOGRAFT, PLATE;  Surgeon: Marybelle Killings, MD;  Location: [place];  Service: Orthopedics;  Laterality: N/A;   BASAL CELL CARCINOMA EXCISION  2022   pt has had areas removed in the past as well   HERNIA REPAIR     pt states she  had an umbilical hernia repair about 25 years ago (around 1997)   Social History   Occupational History   Not on file  Tobacco Use   Smoking status: Never   Smokeless tobacco: Never  Vaping Use   Vaping Use: Never used  Substance and Sexual Activity   Alcohol use: Yes    Comment: very occasional, maybe once or twice a year (1 glass of wine)   Drug use: Never   Sexual activity: Not on file

## 2021-01-02 NOTE — Progress Notes (Signed)
° °  Post-Op Visit Note   Patient: Robin Steele           Date of Birth: May 23, 1965           MRN: [id number] Visit Date: 12/29/2020 PCP: Rosalee Kaufman, PA-C   Assessment & Plan: Post C4-5 anterior cervical discectomy with preop cord compression abnormal gait clonus which is resolved.  She is walking better no longer following.  Patient states she got good relief preop symptoms and wants to go back to work without restrictions on 12/30/2020.  2 view x-rays cervical spine shows good position cervical fusion with lateral flexion-extension x-ray showing no motion.  Chief Complaint:  Chief Complaint  Patient presents with   Neck - Follow-up    11/18/2020 C4-5 ACDF   Visit Diagnoses:  1. Status post cervical spinal fusion     Plan: Work slip given for work resumption on 12/30/2020 without restriction she is happy with the results of surgery and can return to see me on an as-needed basis.  No clonus on exam normal gait pattern today significantly improved from preop.  Follow-Up Instructions: Return if symptoms worsen or fail to improve.   Orders:  Orders Placed This Encounter  Procedures   XR Cervical Spine 2 or 3 views   No orders of the defined types were placed in this encounter.   Imaging: No results found.  PMFS History: Patient Active Problem List   Diagnosis Date Noted   S/P cervical spinal fusion 11/18/2020   Spinal stenosis of cervical region 11/10/2020   Past Medical History:  Diagnosis Date   Anxiety    Arthritis 2011   in neck and back   Cancer ([place]) 2022   skin cancer, 2 areas removed per pt   Depression    Hyperlipidemia    Pre-diabetes 2022   borderline, hgba1c 5.7 in august 2022    Family History  Problem Relation Age of Onset   Breast cancer Maternal Aunt     Past Surgical History:  Procedure Laterality Date   ANTERIOR CERVICAL DECOMP/DISCECTOMY FUSION N/A 11/18/2020   Procedure: CERVICAL FOUR -CERVICAL FIVE  ANTERIOR CERVICAL  DISCECTOMY FUSION, ALLOGRAFT, PLATE;  Surgeon: Marybelle Killings, MD;  Location: [place];  Service: Orthopedics;  Laterality: N/A;   BASAL CELL CARCINOMA EXCISION  2022   pt has had areas removed in the past as well   HERNIA REPAIR     pt states she had an umbilical hernia repair about 25 years ago (around 1997)   Social History   Occupational History   Not on file  Tobacco Use   Smoking status: Never   Smokeless tobacco: Never  Vaping Use   Vaping Use: Never used  Substance and Sexual Activity   Alcohol use: Yes    Comment: very occasional, maybe once or twice a year (1 glass of wine)   Drug use: Never   Sexual activity: Not on file

## 2021-01-17 ENCOUNTER — Ambulatory Visit: Payer: Managed Care, Other (non HMO) | Admitting: Gastroenterology

## 2021-02-15 HISTORY — PX: CERVICAL CONE BIOPSY: SUR198

## 2021-03-08 NOTE — Progress Notes (Signed)
GYNECOLOGIC ONCOLOGY NEW PATIENT CONSULTATION   Patient Name: Robin Steele  Patient Age: 56 y.o. Date of Service: 03/09/21 Referring Provider: Dr. 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  I spent some time reviewing with the patient her recent procedure and pathology from the cone biopsy.  I also discussed the limitations of my exam today.  Given only partially healed cone bed, it is difficult to tell if the mild thickening that I am feeling of both the cervix as well as the parametria represent disease versus inflammation from recent surgery.  We discussed that neck steps will involve imaging to determine if there is any metastatic disease as well as to better characterize residual tumor in the cervix.  This will help [place] make her final treatment plan.  We will first obtain a PET scan to evaluate for metastatic disease.  If there is evidence of metastatic disease, then we discussed moving forward with primary chemotherapy and radiation.  If no metastatic disease evident, the patient was scheduled for an MRI several days after the PET scan to evaluate the residual cervical lesion.  Based on those results and whether adjuvant radiation is suspected to be needed after surgery if surgery pursued, we will make her final treatment plan.    We discussed possible treatment options in the setting of early stage cervical cancer, including radical hysterectomy with pelvic lymphadenectomy or radiation with sensitizing chemotherapy. The risks and benefits of both were reviewed briefly. I have emphasized that the cure rates for these two treatments are equivalent; however, I discussed that the toxicity profiles are different between these two modalities.  At this juncture, I did not go into detail regarding risks of each  treatment.  We will do this once we have additional information.  If the patient appears to be a surgical candidate following imaging, radical hysterectomy via a minimally invasive approach or abdominal procedure was discussed. We reviewed that prior to early 2018 our practice had been to routinely proceed with a robotic assisted procedure. We discussed that prospective data with endometrial cancer showed that outcomes from a minimally invasive procedure were not inferior as compared to one performed via laparotomy. We also discussed that retrospective data evaluating minimally invasive radical hysterectomy had not demonstrated worse outcomes as compared to an abdominal procedure. However, recent prospective data has suggested that there may be worse outcomes from a minimally invasive procedure. This data was published in November, 2018. A retrospective study from a [place] cancer database also seems to support this conclusion. We reviewed the results of these two studies.  Given the already known size of her tumor as well as this data, if the patient is a surgical candidate, I recommend proceeding via an open approach.  I do not see that the patient has had testing for HIV.  We will plan to do this at one of her future visits when we are getting other lab work.  Discussed the role that HPV plays in the pathogenesis of cervical cancer.  I will call the patient after her PET scan.  If we are pursuing the MRI for evaluation of surgical candidacy, we will keep that scheduled and I will call her after the MRI to discuss results.  If she is a surgical candidate, will need to have a follow-up visit for counseling again regarding final treatment plan as  well as a repeat exam to clinically assess the parametria.  REFERENCES:  1. Derrell Lolling PT, Frumovitz M, Pareja R, et al. Minimally invasive versus abdominal radical hysterectomy for cervicalcancer. N Engl J Med. DOI: 95.6213/[id number].\cb3  2. Melamed A, Margul DJ,  Chen L, et al. Survival after minimally invasive radical hysterectomy for early-stage cervical cancer. N Engl J Med. DOI:10.1056/NEJMoa1804923   A copy of this note was sent to the patient's referring provider.   65 minutes of total time was spent for this patient encounter, including preparation, face-to-face counseling with the patient and coordination of care, and documentation of the encounter.   Eugene Garnet, MD  Division of Gynecologic Oncology  Department of Obstetrics and Gynecology  [facility]  ___________________________________________  Chief Complaint: No chief complaint on file.   History of Present Illness:  Robin Steele is a 56 y.o. y.o. female who is seen in consultation at the request of Dr. Georgian Co for an evaluation of cervical cancer.  The patient has a history of prior abnormal paps, did not require biopsies are procedures, normalized. Has previously been HPV+.  More recent history is as follows: Pap 08/2020: ASC-H, HR HPV positive Colposcopy 09/14/20: ECC negative, CIN3 on cervical biopsies CKC 02/15/21: Invasive adenocarcinoma, grade 2, 2.1cm in maximal diameter, 1.5cm DOI with positive endocervical and deep margins, AIS and CIN3 also noted. ECC with detached fragment of adenocarcinoma. No LVI. P16 strongly positive.  Patient notes being on birth control pills since the age of 25.  Several years ago, her menses became much lighter and less frequent.  About a year ago, she reports having hormone levels tested and that they were in menopausal range.  She stopped her birth control pills at this point.  Over the last year or so, she denies any menstrual bleeding but does have some very light pink spotting after intercourse.  Since her cold knife cone, she continues to have some bleeding, denies any significant discharge.  She has some passage of small clots.  Denies any pelvic pain.  She endorses an okay appetite, she is tired of eating the  same things.  She denies any nausea or emesis.  Since her spine surgery in November, she has intermittent constipation and diarrhea.  She also has a history of urinary incontinence as well as fecal incontinence.  Since her spine surgery, the fecal incontinence has resolved completely.  She thinks that her urinary incontinence has decreased some although she continues to have urinary incontinence at random times (does not seem to be associated with urgency or movement/laugh/cough).  The patient lives in [place].  She works as a Tax adviser at [employer].  Surgical history is notable for an umbilical hernia repair many years ago.  Patient does not remember if she had mesh used.  PAST MEDICAL HISTORY:  Past Medical History:  Diagnosis Date   Anxiety    Arthritis 2011   in neck and back   Cancer (HCC) 2022   skin cancer, 2 areas removed per pt   Depression    Difficulty sleeping    Hyperlipidemia    Pre-diabetes 2022   borderline, hgba1c 5.7 in august 2022     PAST SURGICAL HISTORY:  Past Surgical History:  Procedure Laterality Date   ANTERIOR CERVICAL DECOMP/DISCECTOMY FUSION N/A 11/18/2020   Procedure: CERVICAL FOUR -CERVICAL FIVE  ANTERIOR CERVICAL DISCECTOMY FUSION, ALLOGRAFT, PLATE;  Surgeon: Eldred Manges, MD;  Location: [facility] OR;  Service: Orthopedics;  Laterality: N/A;   BASAL  CELL CARCINOMA EXCISION  2022   pt has had areas removed in the past as well   CERVICAL CONE BIOPSY  02/15/2021   HERNIA REPAIR     pt states she had an umbilical hernia repair about 25 years ago (around 1997), unsure if repaired with mesh    OB/GYN HISTORY:  OB History  Gravida Para Term Preterm AB Living  2 2          SAB IAB Ectopic Multiple Live Births               # Outcome Date GA Lbr Len/2nd Weight Sex Delivery Anes PTL Lv  2 Para           1 Para             Patient's last menstrual period was 12/18/2018.  Age at menarche: 89 Age at menopause: within the last 1-2 years Hx  of HRT: Denies Hx of STDs: HPV  Last pap: See HPI History of abnormal pap smears: Yes  SCREENING STUDIES:  Last mammogram: 02/2020  Last colonoscopy: 2018  MEDICATIONS: Outpatient Encounter Medications as of 03/09/2021  Medication Sig   Ascorbic Acid (VITAMIN C) 1000 MG tablet Take 1,000 mg by mouth at bedtime.   Cyanocobalamin (B-12) 5000 MCG CAPS Take 5,000 mcg by mouth at bedtime.   DULoxetine (CYMBALTA) 60 MG capsule Take by mouth.   EYSUVIS 0.25 % SUSP Apply to eye.   Magnesium 250 MG TABS Take 250 mg by mouth at bedtime.   Magnesium Oxide 250 MG TABS Take 1 tablet by mouth every morning.   Multiple Vitamins-Minerals (CENTRUM SILVER 50+WOMEN) TABS Take 1 tablet by mouth at bedtime.   Omega-3 1000 MG CAPS Take 1,000 mg by mouth at bedtime.   Red Yeast Rice 600 MG TABS Take 1,200 mg by mouth at bedtime.   temazepam (RESTORIL) 30 MG capsule Take by mouth.   valACYclovir (VALTREX) 500 MG tablet Take 500 mg by mouth at bedtime.   zinc gluconate 50 MG tablet Take 50 mg by mouth at bedtime.   [DISCONTINUED] valACYclovir (VALTREX) 500 MG tablet Take 1 tablet by mouth 2 (two) times daily.   DULoxetine (CYMBALTA) 60 MG capsule Take 60 mg by mouth at bedtime.   methocarbamol (ROBAXIN) 500 MG tablet Take 1 tablet (500 mg total) by mouth every 6 (six) hours as needed for muscle spasms. (Patient not taking: Reported on 11/24/2020)   oxyCODONE-acetaminophen (PERCOCET/ROXICET) 5-325 MG tablet Take 1 tablet by mouth every 4 (four) hours as needed for severe pain. (Patient not taking: Reported on 11/24/2020)   temazepam (RESTORIL) 30 MG capsule Take 30 mg by mouth at bedtime.   No facility-administered encounter medications on file as of 03/09/2021.    ALLERGIES:  No Known Allergies   FAMILY HISTORY:  Family History  Problem Relation Age of Onset   Colon cancer Mother        maligant tumor on arm   Endometrial cancer Mother    Breast cancer Maternal Aunt    Colon cancer Maternal Grandmother     Uterine cancer Other    Pancreatic cancer Neg Hx    Prostate cancer Neg Hx    Ovarian cancer Neg Hx      SOCIAL HISTORY:  Social Connections: Not on file    REVIEW OF SYSTEMS:  + Constipation, vaginal bleeding, anxiety. Denies appetite changes, fevers, chills, fatigue, unexplained weight changes. Denies hearing loss, neck lumps or masses, mouth sores, ringing in ears or voice  changes. Denies cough or wheezing.  Denies shortness of breath. Denies chest pain or palpitations. Denies leg swelling. Denies abdominal distention, pain, blood in stools, diarrhea, nausea, vomiting, or early satiety. Denies pain with intercourse, dysuria, frequency, hematuria or incontinence. Denies hot flashes, pelvic pain or vaginal discharge.   Denies joint pain, back pain or muscle pain/cramps. Denies itching, rash, or wounds. Denies dizziness, headaches, numbness or seizures. Denies swollen lymph nodes or glands, denies easy bruising or bleeding. Denies depression, confusion, or decreased concentration.  Physical Exam:  Vital Signs for this encounter:  Blood pressure (!) 154/83, pulse 70, temperature 98.3 F (36.8 C), temperature source Oral, resp. rate 18, height 5\' 5"  (1.651 m), weight 149 lb 3.2 oz (67.7 kg), last menstrual period 12/18/2018, SpO2 100 %. Body mass index is 24.83 kg/m. General: Alert, oriented, no acute distress.  HEENT: Normocephalic, atraumatic. Sclera anicteric.  Chest: Clear to auscultation bilaterally. No wheezes, rhonchi, or rales. Cardiovascular: Regular rate and rhythm, no murmurs, rubs, or gallops.  Abdomen: Normoactive bowel sounds. Soft, nondistended, nontender to palpation. No masses or hepatosplenomegaly appreciated. No palpable fluid wave.  Extremities: Grossly normal range of motion. Warm, well perfused. No edema bilaterally.  Skin: No rashes or lesions.  Lymphatics: No cervical, supraclavicular, or inguinal adenopathy.  GU:  Normal external female genitalia. No  lesions.              Bladder/urethra:  No lesions or masses, well supported bladder             Vagina: Well rugated, no lesions noted.  Minimal blood and discharge within the vaginal vault.             Cervix: Still healing, cone bed inspected.  Cervix itself is what rather flush with the vagina, no visible lesions noted.  The cervix itself is mildly firm, almost complete loss of tissue plane between the posterior cervix and the vagina.             Uterus: Small, mobile, some parametrial induration noted bilaterally, more on the right.             Adnexa: No masses appreciated.  Rectal: Confirms findings above  LABORATORY AND RADIOLOGIC DATA:  Outside medical records were reviewed to synthesize the above history, along with the history and physical obtained during the visit.   Lab Results  Component Value Date   WBC 6.6 11/17/2020   HGB 14.3 11/17/2020   HCT 44.5 11/17/2020   PLT 315 11/17/2020   GLUCOSE 107 (H) 11/17/2020   NA 139 11/17/2020   K 4.0 11/17/2020   CL 104 11/17/2020   CREATININE 1.02 (H) 11/17/2020   BUN 12 11/17/2020   CO2 29 11/17/2020   HGBA1C 6.2 (H) 11/17/2020

## 2021-03-09 ENCOUNTER — Telehealth: Payer: Self-pay | Admitting: Oncology

## 2021-03-09 ENCOUNTER — Encounter: Payer: Self-pay | Admitting: Gynecologic Oncology

## 2021-03-09 ENCOUNTER — Inpatient Hospital Stay: Payer: Managed Care, Other (non HMO) | Attending: Gynecologic Oncology | Admitting: Gynecologic Oncology

## 2021-03-09 ENCOUNTER — Other Ambulatory Visit: Payer: Self-pay

## 2021-03-09 VITALS — BP 154/83 | HR 70 | Temp 98.3°F | Resp 18 | Ht 65.0 in | Wt 149.2 lb

## 2021-03-09 DIAGNOSIS — R413 Other amnesia: Secondary | ICD-10-CM | POA: Diagnosis not present

## 2021-03-09 DIAGNOSIS — Z981 Arthrodesis status: Secondary | ICD-10-CM | POA: Diagnosis not present

## 2021-03-09 DIAGNOSIS — C539 Malignant neoplasm of cervix uteri, unspecified: Secondary | ICD-10-CM | POA: Insufficient documentation

## 2021-03-09 DIAGNOSIS — Z79899 Other long term (current) drug therapy: Secondary | ICD-10-CM | POA: Diagnosis not present

## 2021-03-09 DIAGNOSIS — Z800 Family history of malignant neoplasm of digestive organs: Secondary | ICD-10-CM | POA: Diagnosis not present

## 2021-03-09 DIAGNOSIS — F32A Depression, unspecified: Secondary | ICD-10-CM | POA: Diagnosis not present

## 2021-03-09 DIAGNOSIS — A630 Anogenital (venereal) warts: Secondary | ICD-10-CM | POA: Diagnosis not present

## 2021-03-09 DIAGNOSIS — K5909 Other constipation: Secondary | ICD-10-CM | POA: Diagnosis not present

## 2021-03-09 DIAGNOSIS — Z8049 Family history of malignant neoplasm of other genital organs: Secondary | ICD-10-CM | POA: Diagnosis not present

## 2021-03-09 DIAGNOSIS — Z803 Family history of malignant neoplasm of breast: Secondary | ICD-10-CM | POA: Diagnosis not present

## 2021-03-09 DIAGNOSIS — R32 Unspecified urinary incontinence: Secondary | ICD-10-CM | POA: Insufficient documentation

## 2021-03-09 DIAGNOSIS — E785 Hyperlipidemia, unspecified: Secondary | ICD-10-CM | POA: Insufficient documentation

## 2021-03-09 DIAGNOSIS — Z85828 Personal history of other malignant neoplasm of skin: Secondary | ICD-10-CM | POA: Insufficient documentation

## 2021-03-09 DIAGNOSIS — B977 Papillomavirus as the cause of diseases classified elsewhere: Secondary | ICD-10-CM

## 2021-03-09 DIAGNOSIS — F419 Anxiety disorder, unspecified: Secondary | ICD-10-CM | POA: Insufficient documentation

## 2021-03-09 DIAGNOSIS — M1990 Unspecified osteoarthritis, unspecified site: Secondary | ICD-10-CM | POA: Insufficient documentation

## 2021-03-09 NOTE — Patient Instructions (Signed)
It was a pleasure meeting you today. ? ?As we discussed, you have cervical cancer.  The type of cancer is adenocarcinoma. ? ?Based on the results from your cone, we need some additional information to help determine your stage and what treatment is most appropriate. ? ?If it looks like the cancer has not spread outside of your cervix, we talked about 2 treatment strategies including surgery and chemotherapy with radiation.  The details of your pathology as well as your scans will help [place] make that decision.  It is difficult for me to tell if the cancer has spread outside of your cervix on exam today because you are still in the process of healing from your surgery. ? ?If there is evidence on your scans that the cancer has spread outside of the cervix, then surgery is no longer a treatment option.  In that case, we will move forward with getting you scheduled to see our radiation doctor and her medical oncologist to start radiation with chemotherapy. ?

## 2021-03-09 NOTE — Telephone Encounter (Signed)
Robin Steele with Dr. Doug Sou office to confirm lymphovascular invasion on [id number].   ?

## 2021-03-14 NOTE — Telephone Encounter (Signed)
Dr. Martinique called and said he reviewed accession [phone] and did not see any lymphovascular invasion. ?

## 2021-03-16 ENCOUNTER — Other Ambulatory Visit: Payer: Self-pay | Admitting: Nurse Practitioner

## 2021-03-16 DIAGNOSIS — Z1231 Encounter for screening mammogram for malignant neoplasm of breast: Secondary | ICD-10-CM

## 2021-03-22 ENCOUNTER — Ambulatory Visit
Admission: RE | Admit: 2021-03-22 | Discharge: 2021-03-22 | Disposition: A | Discharge status: Home / Self Care | Payer: Managed Care, Other (non HMO) | Source: Ambulatory Visit | Attending: Nurse Practitioner | Admitting: Nurse Practitioner

## 2021-03-22 ENCOUNTER — Other Ambulatory Visit: Payer: Self-pay

## 2021-03-22 DIAGNOSIS — Z1231 Encounter for screening mammogram for malignant neoplasm of breast: Secondary | ICD-10-CM

## 2021-03-23 ENCOUNTER — Ambulatory Visit (HOSPITAL_COMMUNITY)
Admission: RE | Admit: 2021-03-23 | Discharge: 2021-03-23 | Disposition: A | Discharge status: Home / Self Care | Payer: Managed Care, Other (non HMO) | Source: Ambulatory Visit | Attending: Gynecologic Oncology | Admitting: Gynecologic Oncology

## 2021-03-23 DIAGNOSIS — C539 Malignant neoplasm of cervix uteri, unspecified: Secondary | ICD-10-CM | POA: Insufficient documentation

## 2021-03-23 LAB — GLUCOSE, CAPILLARY: Glucose-Capillary: 99 mg/dL (ref 70–99)

## 2021-03-23 MED ORDER — FLUDEOXYGLUCOSE F - 18 (FDG) INJECTION
7.3800 | Freq: Once | INTRAVENOUS | Status: AC
  Administered 2021-03-23: 7.38 via INTRAVENOUS

## 2021-03-24 ENCOUNTER — Encounter: Payer: Self-pay | Admitting: Gynecologic Oncology

## 2021-03-24 ENCOUNTER — Telehealth: Payer: Self-pay

## 2021-03-24 NOTE — Telephone Encounter (Signed)
Following up with Robin Steele this afternoon. Advised patient that per Dr. Berline Lopes the MRI is going to help finalize the plan and whether surgery is possible. Patient verbalized understanding. ?Notified her that her insurance has approved the MRI and provided her with approval # [id number] and to arrive at [facility] long Monday at 08:30 am. Once Dr. Berline Lopes has looked at the images someone from our office will contact her to review the results. Patient verbalized understanding.  ?

## 2021-03-24 NOTE — Progress Notes (Signed)
Peer to peer performed with Dr. Starleen Blue with patient's insurance for MRI. Approved with auth# [id number]. Auth team notified. ?

## 2021-03-24 NOTE — Telephone Encounter (Signed)
Received phone call from Ms. Brzozowski this morning. Patient states " I saw Dr. Lulu Riding message on my PET scan results. I'm wondering if I need to have the MRI on Monday?" ?Advised patient that I will look into this and someone from the office will call her back this afternoon. ?

## 2021-03-27 ENCOUNTER — Other Ambulatory Visit: Payer: Self-pay

## 2021-03-27 ENCOUNTER — Ambulatory Visit (HOSPITAL_COMMUNITY)
Admission: RE | Admit: 2021-03-27 | Discharge: 2021-03-27 | Disposition: A | Discharge status: Home / Self Care | Payer: Managed Care, Other (non HMO) | Source: Ambulatory Visit | Attending: Gynecologic Oncology | Admitting: Gynecologic Oncology

## 2021-03-27 DIAGNOSIS — C539 Malignant neoplasm of cervix uteri, unspecified: Secondary | ICD-10-CM | POA: Insufficient documentation

## 2021-03-27 MED ORDER — GADOBUTROL 1 MMOL/ML IV SOLN
7.5000 mL | Freq: Once | INTRAVENOUS | Status: AC | PRN
  Administered 2021-03-27: 7.5 mL via INTRAVENOUS

## 2021-03-28 ENCOUNTER — Telehealth: Payer: Self-pay | Admitting: Gynecologic Oncology

## 2021-03-28 NOTE — Telephone Encounter (Signed)
Called patient, discussed MRI. I am concerned about parametrial involvement. Will await additional images, but I am recommending based on pictures obtained that we proceed with plan for primary chemoradiation. I have spoke with karen about scheduling appts with Drs. Kinard and [place]. Will present patient at tumor board. ? ?Jeral Pinch MD ?Gynecologic Oncology ? ? ?

## 2021-03-29 ENCOUNTER — Telehealth: Payer: Self-pay | Admitting: Oncology

## 2021-03-29 DIAGNOSIS — C539 Malignant neoplasm of cervix uteri, unspecified: Secondary | ICD-10-CM

## 2021-03-29 NOTE — Telephone Encounter (Signed)
[facility] and advised her of appointments with Dr. Alvy Bimler on 03/31/21 at 1:00 with 12:30 arrival and with Dr. Sondra Come on 04/04/21 at 9:30 for a nurse eval and 10:00 for a consult.  She verbalized understanding and agreement of all appointments. ?

## 2021-03-31 ENCOUNTER — Other Ambulatory Visit: Payer: Self-pay

## 2021-03-31 ENCOUNTER — Inpatient Hospital Stay: Payer: Managed Care, Other (non HMO) | Admitting: Hematology and Oncology

## 2021-03-31 ENCOUNTER — Encounter: Payer: Self-pay | Admitting: Hematology and Oncology

## 2021-03-31 ENCOUNTER — Encounter: Payer: Self-pay | Admitting: Oncology

## 2021-03-31 ENCOUNTER — Ambulatory Visit: Payer: Managed Care, Other (non HMO)

## 2021-03-31 ENCOUNTER — Other Ambulatory Visit (HOSPITAL_COMMUNITY): Payer: Self-pay

## 2021-03-31 VITALS — BP 125/81 | HR 77 | Temp 98.1°F | Resp 18 | Ht 65.0 in | Wt 146.2 lb

## 2021-03-31 DIAGNOSIS — Z803 Family history of malignant neoplasm of breast: Secondary | ICD-10-CM

## 2021-03-31 DIAGNOSIS — K5909 Other constipation: Secondary | ICD-10-CM | POA: Insufficient documentation

## 2021-03-31 DIAGNOSIS — Z981 Arthrodesis status: Secondary | ICD-10-CM | POA: Diagnosis not present

## 2021-03-31 DIAGNOSIS — Z800 Family history of malignant neoplasm of digestive organs: Secondary | ICD-10-CM | POA: Diagnosis not present

## 2021-03-31 DIAGNOSIS — Z8049 Family history of malignant neoplasm of other genital organs: Secondary | ICD-10-CM

## 2021-03-31 DIAGNOSIS — C539 Malignant neoplasm of cervix uteri, unspecified: Secondary | ICD-10-CM | POA: Diagnosis not present

## 2021-03-31 MED ORDER — ONDANSETRON HCL 8 MG PO TABS
8.0000 mg | ORAL_TABLET | Freq: Two times a day (BID) | ORAL | 1 refills | Status: DC | PRN
  Filled 2021-03-31: qty 30, 15d supply, fill #0

## 2021-03-31 MED ORDER — LIDOCAINE-PRILOCAINE 2.5-2.5 % EX CREA
TOPICAL_CREAM | CUTANEOUS | 3 refills | Status: DC
  Filled 2021-03-31: qty 30, 30d supply, fill #0
  Filled 2021-06-26: qty 30, 30d supply, fill #1

## 2021-03-31 MED ORDER — PROCHLORPERAZINE MALEATE 10 MG PO TABS
10.0000 mg | ORAL_TABLET | Freq: Four times a day (QID) | ORAL | 1 refills | Status: DC | PRN
  Filled 2021-03-31: qty 30, 8d supply, fill #0
  Filled 2021-05-07: qty 30, 8d supply, fill #1

## 2021-03-31 NOTE — Assessment & Plan Note (Signed)
She is currently asymptomatic ?We discussed concurrent chemotherapy with radiation approach with curative intent ?We discussed the role of chemotherapy. The intent is of curative intent. ? ?We discussed some of the risks, benefits, side-effects of cisplatin and its role as chemo sensitizing agent. ?The plan for weekly cisplatin for x5-6 doses along with radiation treatment. ? ?Some of the short term side-effects included, though not limited to, including weight loss, life threatening infections, risk of allergic reactions, need for transfusions of blood products, nausea, vomiting, change in bowel habits, loss of hair, admission to hospital for various reasons, and risks of death.  ? ?Long term side-effects are also discussed including risks of infertility, permanent damage to nerve function, hearing loss, chronic fatigue, kidney damage with possibility needing hemodialysis, and rare secondary malignancy including bone marrow disorders. ? ?The patient is aware that the response rates discussed earlier is not guaranteed.  After a long discussion, patient made an informed decision to proceed with the prescribed plan of care.  ? ?Patient education material was dispensed. ?I will order chemo education class, port placement with plan to start treatment within the next 2 weeks ?I will see her after cycle 1 of treatment on a weekly basis for toxicity review ? ? ?

## 2021-03-31 NOTE — Progress Notes (Addendum)
GYN Location of Tumor / Histology: Cervical ? ?Robin Steele presented with symptoms of: HGSIL (high grade squamous intraepithelial lesion) on Pap smear of cervix ? ?Biopsies revealed: Pathology c/w invasive adenocarcinoma, endocervical and deep margins involved. AIS. HGSIL with involvement of endocervical glands. Post cone ECC: detached fragment of adenocarcinoma. ? ?Past/Anticipated interventions by Gyn/Onc surgery, if any: Dr Berline Lopes ?We will first obtain a PET scan to evaluate for metastatic disease.  If there is evidence of metastatic disease, then we discussed moving forward with primary chemotherapy and radiation.  If no metastatic disease evident, the patient was scheduled for an MRI several days after the PET scan to evaluate the residual cervical lesion.  Based on those results and whether adjuvant radiation is suspected to be needed after surgery if surgery pursued, we will make her final treatment plan.  ? ?Past/Anticipated interventions by medical oncology, if any: pending ? ?Weight changes, if any: no ? ?Bowel/Bladder complaints, if any:  constipation, no urinary symptoms ? ?Nausea/Vomiting, if any: no ? ?Pain issues, if any:  no ? ?SAFETY ISSUES: ?Prior radiation? no ?Pacemaker/ICD? no ?Possible current pregnancy? no ?Is the patient on methotrexate? no ? ?Current Complaints / other details:  mild tingling in fingers after spinal fusion ? ?Vitals:  ? 04/04/21 0925  ?BP: 133/78  ?Pulse: 74  ?Resp: 18  ?Temp: (!) 97.1 ?F (36.2 ?C)  ?TempSrc: Temporal  ?SpO2: 100%  ?Weight: 147 lb 4 oz (66.8 kg)  ?Height: '5\' 5"'$  (1.651 m)  ? ? ? ?

## 2021-03-31 NOTE — Progress Notes (Signed)
START OFF PATHWAY REGIMEN - Other   [id number]:Cisplatin 40 mg/m2 IV D1 q7 Days + RT:   A cycle is every 7 days:     Cisplatin   **Always confirm dose/schedule in your pharmacy ordering system**  Patient Characteristics: Intent of Therapy: Curative Intent, Discussed with Patient 

## 2021-03-31 NOTE — Progress Notes (Signed)
Met with Robin Steele and provided her with the [facility] folder and reviewed her upcoming appointments with Dr. Kinard on 04/04/21 and CT Simulation on 04/05/21 at 9:00.  Encouraged her to call with any questions or concerns. ?

## 2021-03-31 NOTE — Progress Notes (Signed)
Combine ?CONSULT NOTE ? ?Patient Care Team: ?Robin Steele as PCP - General (Physician Assistant) ?Janina Mayo, MD as PCP - Cardiology (Cardiology) ?Curlene Labrum, MD as Physician Assistant (Family Medicine) ?Awanda Mink Craige Cotta, RN as Oncology Nurse Navigator (Oncology) ? 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 The intent is of curative intent. ? ?We discussed some of the risks, benefits, side-effects of cisplatin and its role as chemo sensitizing agent. ?The plan for weekly cisplatin for x5-6 doses along with radiation treatment. ? ?Some of the short term side-effects included, though not limited to, including weight loss, life threatening infections, risk of allergic reactions, need for transfusions of blood products, nausea, vomiting, change in bowel habits, loss of hair, admission to hospital for various reasons, and risks of death.  ? ?Long term side-effects are also discussed including risks of infertility, permanent damage to nerve function, hearing loss, chronic fatigue, kidney damage with possibility needing hemodialysis, and rare secondary malignancy including bone marrow disorders. ? ?The patient is aware that the response rates discussed earlier is not guaranteed.  After a long discussion, patient made an informed decision to proceed with the prescribed plan of care.  ? ?Patient education material was dispensed. ?I will order chemo education class, port placement with plan to start treatment within the next 2 weeks ?I will see her after cycle 1 of treatment on a weekly basis for toxicity review ? ? ? 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 ?Other constipation ?She has  chronic constipation at baseline ?We discussed the importance of regular laxatives ? ?Orders Placed This Encounter  ?Procedures  ? IR IMAGING GUIDED PORT INSERTION  ?  Standing Status:   Future  ?  Standing Expiration Date:   04/01/2022  ?  Order Specific Question:   Reason for Exam (SYMPTOM  OR DIAGNOSIS REQUIRED)  ?  Answer:   need port for chemo  ?  Order Specific Question:   Is the patient pregnant?  ?  Answer:   No  ?  Order Specific Question:   Preferred Imaging Location?  ?  Answer:   [facility]  ? CBC with Differential ([place] Only)  ?  Standing Status:   Standing  ?  Number of Occurrences:   20  ?  Standing Expiration Date:   04/01/2022  ? [place] Only  ?  Standing Status:   Standing  ?  Number of Occurrences:   20  ?  Standing Expiration Date:   04/01/2022  ? [place]  ?  Standing Status:   Standing  ?  Number of Occurrences:   20  ?  Standing Expiration Date:   04/01/2022  ? ? ?The total time spent in the appointment was 60 minutes encounter with patients including review of chart and various tests results, discussions about plan of care and coordination of care plan ? ? All questions were answered. The patient knows to call the clinic with any problems, questions or concerns. No barriers to learning was detected. ? ?Heath Lark, MD ?3/24/20232:24 PM ? ?CHIEF COMPLAINTS/PURPOSE OF CONSULTATION:  ?Cervical cancer, for chemotherapy ? ?HISTORY OF PRESENTING ILLNESS:  ?Robin Steele 56 y.o. female is here because of recent  diagnosis of cervical cancer ?The patient have history of abnormal Pap smear ?Recent biopsy show invasive cancer ?She has no new symptoms such as vaginal bleeding, discharge or pelvic pain ? ?I have reviewed her chart and materials related to her cancer extensively and collaborated history with the patient. 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 The patient has a  history of prior abnormal paps, did not require biopsies are procedures, normalized. Has previously been HPV+.  More recent history is as follows: ?Pap 08/2020: ASC-H, HR HPV positive ?Colposcopy 09/14/20: ECC negative, CIN3 on cervical biopsies ?  ?02/15/2021 Pathology Results  ? [id number] ? ?Cone biopsy of cervix showed invasive adenocarcinoma, 2.1 cm in maximum dimension, 1.5 cm depth of invasion.  Endocervical and deep margins are involved.  High-grade squamous intraepithelial lesion with involvement of endocervical glands were present.  Endocervix curettage show detached fragment of adenocarcinoma. ?P16 is strongly positive. ?  ?03/09/2021 Initial Diagnosis  ? 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  ?03/24/2021 PET scan  ? 1. Moderate activity in the cervix with maximum SUV of 5.6. Some of this could be postprocedural/inflammatory related to recent conization, but residual tumor is not excluded. No findings of ?distant metastatic spread or hypermetabolic adenopathy. ?  ?03/30/2021 Imaging  ? MRI pelvis ?Repeat imaging was performed with intersection again showing the tumor biased towards the anterior aspect of the cervix but tracking towards the RIGHT posterolateral cervix with stromal disruption at the level of the inferior posterolateral cervix on the RIGHT (image 19/40) ?  ?Contrasted imaging (image 20/44) this shows contrast enhancement which follows this pattern. Early extension into the RIGHT posterolateral parametrium at the lower margin of the cervix is demonstrated on both contrasted and precontrast T2 weighted imaging. The lesion involves RIGHT anterolateral 2/3 of the cervix with loss of endocervical distinction noted on image 19 of 40 across the entire interface between the mass and the cervix, cervical ?disruption of stroma noted RIGHT posterolateral aspect as described.   ?Ovaries are normal. ?  ?Final impression: Cervical neoplasm with extension into cervical stroma and disruption of cervical stroma most  notably along the RIGHT posterolateral margin with early extension into the RIGHT parametrium. ?  ?  ?03/31/2021 Cancer Staging  ? Staging form: Cervix Uteri, AJCC Version 9 ?- Clinical stage from 03/31/2021: Stage IIB (cT2b, cN0, cM0) - Signed by Heath Lark, MD on 03/31/2021 ?Stage prefix: Initial diagnosis ? ?  ?04/12/2021 -  Chemotherapy  ? Patient is on Treatment Plan : Cervical Cisplatin q7d  ?   ? ? ?MEDICAL HISTORY:  ?Past Medical History:  ?Diagnosis Date  ? Anxiety   ? Arthritis 2011  ? in neck and back  ? Cancer [facility]) 2022  ? skin cancer, 2 areas removed per pt  ? Depression   ? Difficulty sleeping   ? Hyperlipidemia   ? Pre-diabetes 2022  ? borderline, hgba1c 5.7 in august 2022  ? ? ?SURGICAL HISTORY: ?Past Surgical History:  ?Procedure Laterality Date  ? ANTERIOR CERVICAL DECOMP/DISCECTOMY FUSION N/A 11/18/2020  ? Procedure: CERVICAL FOUR -CERVICAL FIVE  ANTERIOR CERVICAL DISCECTOMY FUSION, ALLOGRAFT, PLATE;  Surgeon: Marybelle Killings, MD;  Location: [place];  Service: Orthopedics;  Laterality: N/A;  ? BASAL CELL CARCINOMA EXCISION  2022  ? pt has had areas removed in the past as well  ? CERVICAL CONE BIOPSY  02/15/2021  ? HERNIA REPAIR    ? pt states she had an umbilical hernia repair  about 25 years ago (around 1997), unsure if repaired with mesh  ? ? ?SOCIAL HISTORY: ?Social History  ? ?Socioeconomic History  ? Marital status: Single  ?  Spouse name: Not on file  ? Number of children: Not on file  ? Years of education: Not on file  ? Highest education level: Not on file  ?Occupational History  ? Not on file  ?Tobacco Use  ? Smoking status: Never  ? Smokeless tobacco: Never  ?Vaping Use  ? Vaping Use: Never used  ?Substance and Sexual Activity  ? Alcohol use: Yes  ?  Comment: very occasional, maybe once or twice a year (1 glass of wine)  ? Drug use: Never  ? Sexual activity: Yes  ?Other Topics Concern  ? Not on file  ?Social History Narrative  ? Not on file  ? ?Social Determinants of Health  ? ?Financial  Resource Strain: Not on file  ?Food Insecurity: Not on file  ?Transportation Needs: Not on file  ?Physical Activity: Not on file  ?Stress: Not on file  ?Social Connections: Not on file  ?Intimate Partner Violence

## 2021-03-31 NOTE — Assessment & Plan Note (Signed)
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

## 2021-03-31 NOTE — Assessment & Plan Note (Signed)
She has chronic constipation at baseline ?We discussed the importance of regular laxatives ?

## 2021-04-03 ENCOUNTER — Other Ambulatory Visit: Payer: Self-pay | Admitting: Oncology

## 2021-04-03 NOTE — Progress Notes (Signed)
?Radiation Oncology         [phone] ?________________________________ ? ?Initial Outpatient Consultation ? ?Name: Robin Steele MRN: [id number]  ?Date: 04/04/2021  DOB: 1965/06/14 ? ?UT:[id number], Aundra Millet, PA-C  Lafonda Mosses, MD  ? ?REFERRING PHYSICIAN: Lafonda Mosses, MD ? 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 ? 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 ? 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 ?HISTORY OF PRESENT ILLNESS::Robin Steele is a 56 y.o. female who is seen as a courtesy of Dr. Berline Lopes for an opinion concerning radiation therapy as part of management for her recently diagnosed cervical cancer.  ? ?The patient has a history of abnormal Pap smears, none of which required biopsies or procedures in the past until a Pap performed in August of 2022 showing HPV positive ASCUS. Subsequently, the patient underwent a colposcopy at [facility] on 09/14/20. 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  ? ?Upon further discussion with Dr. Addison Bailey at [facility], the patient agreed to proceed with CKC on 02/15/21. Pathology from the procedure revealed grade 2 invasive adenocarcinoma (p16 strongly positive) measuring 2.1 cm in the max diameter, with the endocervical and deep margins involved. Path also showed AIS and HGSIL with involvement of endocervical glands. Endocervical curettage also showed a detatched fragment of adenocarcinoma.  ? ?Subsequently, the patient was referred to Dr. Berline Lopes on 03/09/21 for further management. During this visit, the patient reported being on birth control pills since the age of 43, and that her menses became much lighter and less  frequent several years ago. Around 1 year ago, she stated that she had her hormone levels tested which showed signs of menopause, prompting her to stop oral contraception around that time. The patient was also noted to report light pink discharge following intercourse. Since her CKC, the patient also reported some vaginal bleeding, but denied any pelvic pain. Physical exam performed during this visit revealed ongoing healing consistent with recent CKC, and complete loss of tissue plane between the posterior cervix and vagina. No visible lesions were noted. Further work-up was discussed with the patient including PET to rule out metastatic disease.  ? ?Subsequent PET on 03/23/21 showed moderate activity in the cervix with maximum SUV of 5.6. Activity appreciated was noted as possibly postprocedural/inflammatory, related to recent conization, but residual tumor could not be excluded. Otherwise, PER showed no evidence of distant metastatic spread or hypermetabolic adenopathy.  ? ?MRI of the pelvis on 03/27/21 demonstrated signs of cervical neoplasm along the anterior and right lateral ?cervix, with potential extension into the right parametrium disrupting the internal endocervical boundaries, and involvement of cervical stroma. No signs of adenopathy were appreciated in the pelvis. MRI of the pelvis was repeated on 03/30/21 again showing the cervical neoplasm extending into cervical ?stroma, with disruption of cervical stroma most notably along the right posterolateral margin with early extension into the right parametrium. ? ?Given no evidence of metastatic disease, the patient was referred to Dr. Alvy Bimler on 03/31/21 to discuss systemic treatment options. Following discussion of the risks and benefits, the patient agreed to proceed with chemotherapy consisting of weekly cisplatin for x 5-6 doses, along with concurrent RT.  ? ?Of note: The patient underwent cervical spinal fusion on  11/18/20 under the care of Dr.  Lorin Mercy. Patient also had her most recent screening mammogram on 03/22/21 which showed no evidence of malignancy in either breast.  ? ? ?PREVIOUS RADIATION THERAPY: No ? ?PAST MEDICAL HISTORY:  ?Past Medical History:  ?Diagnosis Date  ? Anxiety   ? Arthritis 2011  ? in neck and back  ? Cancer [facility]) 2022  ? skin cancer, 2 areas removed per pt  ? Depression   ? Difficulty sleeping   ? Hyperlipidemia   ? Pre-diabetes 2022  ? borderline, hgba1c 5.7 in august 2022  ? ? ?PAST SURGICAL HISTORY: ?Past Surgical History:  ?Procedure Laterality Date  ? ANTERIOR CERVICAL DECOMP/DISCECTOMY FUSION N/A 11/18/2020  ? Procedure: CERVICAL FOUR -CERVICAL FIVE  ANTERIOR CERVICAL DISCECTOMY FUSION, ALLOGRAFT, PLATE;  Surgeon: Marybelle Killings, MD;  Location: [place];  Service: Orthopedics;  Laterality: N/A;  ? BASAL CELL CARCINOMA EXCISION  2022  ? pt has had areas removed in the past as well  ? CERVICAL CONE BIOPSY  02/15/2021  ? HERNIA REPAIR    ? pt states she had an umbilical hernia repair about 25 years ago (around 1997), unsure if repaired with mesh  ? ? ?FAMILY HISTORY:  ?Family History  ?Problem Relation Age of Onset  ? Colon cancer Mother   ?     maligant tumor on arm  ? Endometrial cancer Mother   ? Breast cancer Maternal Aunt   ? Colon cancer Maternal Grandmother   ? Uterine cancer Other   ? Pancreatic cancer Neg Hx   ? Prostate cancer Neg Hx   ? Ovarian cancer Neg Hx   ? ? ?SOCIAL HISTORY:  ?Social History  ? ?Tobacco Use  ? Smoking status: Never  ? Smokeless tobacco: Never  ?Vaping Use  ? Vaping Use: Never used  ?Substance Use Topics  ? Alcohol use: Yes  ?  Comment: very occasional, maybe once or twice a year (1 glass of wine)  ? Drug use: Never  ?Works full-time at [employer].  She does work remotely at this time. ? ?ALLERGIES: No Known Allergies ? ?MEDICATIONS:  ?Current Outpatient Medications  ?Medication Sig Dispense Refill  ? Ascorbic Acid (VITAMIN C) 1000 MG tablet Take 1,000 mg by mouth at bedtime.    ? Cyanocobalamin (B-12)  5000 MCG CAPS Take 5,000 mcg by mouth at bedtime.    ? DULoxetine (CYMBALTA) 60 MG capsule Take by mouth.    ? EYSUVIS 0.25 % SUSP Apply to eye.    ? Magnesium Oxide 250 MG TABS Take 1 tablet by mouth every morning.    ? Multiple Vitamins-Minerals (CENTRUM SILVER 50+WOMEN) TABS Take 1 tablet by mouth at bedtime.    ? Omega-3 1000 MG CAPS Take 1,000 mg by mouth at bedtime.    ? Red Yeast Rice 600 MG TABS Take 1,200 mg by mouth at bedtime.    ? temazepam (RESTORIL) 30 MG capsule Take by mouth.    ? valACYclovir (VALTREX) 500 MG tablet Take 500 mg by mouth at bedtime.    ? lidocaine-prilocaine (EMLA) cream Apply to affected area once (Patient not taking: Reported on 04/04/2021) 30 g 3  ? ondansetron (ZOFRAN) 8 MG tablet Take 1 tablet by mouth 2  times daily as needed. Start on the third day after cisplatin chemotherapy. (Patient not taking: Reported on 04/04/2021) 30 tablet 1  ? prochlorperazine (COMPAZINE) 10 MG tablet Take 1 tablet  by mouth every 6  hours as needed (Nausea or vomiting). (Patient not taking:  Reported on 04/04/2021) 30 tablet 1  ? ?No current facility-administered medications for this encounter.  ? ? ?REVIEW OF SYSTEMS:  A 10+ POINT REVIEW OF SYSTEMS WAS OBTAINED including neurology, dermatology, psychiatry, cardiac, respiratory, lymph, extremities, GI, GU, musculoskeletal, constitutional, reproductive, HEENT.  She did have some mild postcoital bleeding prior to diagnosis.  She had some mild vaginal bleeding after her CKC.  She denies any pelvic pain abdominal bloating. ?  ?PHYSICAL EXAM:  height is '5\' 5"'$  (1.651 m) and weight is 147 lb 4 oz (66.8 kg). Her temporal temperature is 97.1 ?F (36.2 ?C) (abnormal). Her blood pressure is 133/78 and her pulse is 74. Her respiration is 18 and oxygen saturation is 100%.   ?General: Alert and oriented, in no acute distress ?HEENT: Head is normocephalic. Extraocular movements are intact.  ?Neck: Neck is supple, no palpable cervical or supraclavicular  lymphadenopathy. ?Heart: Regular in rate and rhythm with no murmurs, rubs, or gallops. ?Chest: Clear to auscultation bilaterally, with no rhonchi, wheezes, or rales. ?Abdomen: Soft, nontender, nondistended, with no rigidity or

## 2021-04-03 NOTE — Progress Notes (Signed)
Gynecologic Oncology Multi-Disciplinary Disposition Conference Note ? ?Date of the Conference: 04/03/2021 ? ?Patient Name: Robin Steele  ?Referring Provider: Dr. Addison Bailey ?Primary GYN Oncologist: Dr. Berline Lopes ? ?Stage/Disposition:  Stage IIB adenocarcinoma of the cervix. Disposition is to primary chemotherapy and radiation. Also referral to genetic counseling due to family history of lynch-related cancers. ? ?This Multidisciplinary conference took place involving physicians from [place], Medical Oncology, Radiation Oncology, Pathology, Radiology along with the Gynecologic Oncology Nurse Practitioner and Gynecologic Oncology Nurse Navigator.  Comprehensive assessment of the patient's malignancy, staging, need for surgery, chemotherapy, radiation therapy, and need for further testing were reviewed. Supportive measures, both inpatient and following discharge were also discussed. The recommended plan of care is documented. Greater than 35 minutes were spent correlating and coordinating this patient's care.  ?

## 2021-04-04 ENCOUNTER — Other Ambulatory Visit: Payer: Managed Care, Other (non HMO)

## 2021-04-04 ENCOUNTER — Other Ambulatory Visit: Payer: Self-pay

## 2021-04-04 ENCOUNTER — Other Ambulatory Visit (HOSPITAL_COMMUNITY): Payer: Self-pay

## 2021-04-04 ENCOUNTER — Ambulatory Visit
Admission: RE | Admit: 2021-04-04 | Discharge: 2021-04-04 | Disposition: A | Discharge status: Home / Self Care | Payer: Managed Care, Other (non HMO) | Source: Ambulatory Visit | Attending: Radiation Oncology | Admitting: Radiation Oncology

## 2021-04-04 ENCOUNTER — Encounter: Payer: Self-pay | Admitting: Hematology and Oncology

## 2021-04-04 ENCOUNTER — Encounter: Payer: Self-pay | Admitting: Oncology

## 2021-04-04 ENCOUNTER — Inpatient Hospital Stay: Payer: Managed Care, Other (non HMO)

## 2021-04-04 ENCOUNTER — Encounter: Payer: Self-pay | Admitting: Radiation Oncology

## 2021-04-04 VITALS — BP 133/78 | HR 74 | Temp 97.1°F | Resp 18 | Ht 65.0 in | Wt 147.2 lb

## 2021-04-04 DIAGNOSIS — R143 Flatulence: Secondary | ICD-10-CM | POA: Insufficient documentation

## 2021-04-04 DIAGNOSIS — M129 Arthropathy, unspecified: Secondary | ICD-10-CM | POA: Insufficient documentation

## 2021-04-04 DIAGNOSIS — C539 Malignant neoplasm of cervix uteri, unspecified: Secondary | ICD-10-CM

## 2021-04-04 DIAGNOSIS — E785 Hyperlipidemia, unspecified: Secondary | ICD-10-CM | POA: Insufficient documentation

## 2021-04-04 DIAGNOSIS — Z803 Family history of malignant neoplasm of breast: Secondary | ICD-10-CM | POA: Insufficient documentation

## 2021-04-04 DIAGNOSIS — Z800 Family history of malignant neoplasm of digestive organs: Secondary | ICD-10-CM | POA: Insufficient documentation

## 2021-04-04 DIAGNOSIS — Z85828 Personal history of other malignant neoplasm of skin: Secondary | ICD-10-CM | POA: Insufficient documentation

## 2021-04-04 NOTE — Progress Notes (Signed)
Met with Robin Steele and discussed what to expect with treatment.  Also gave her the completed critical illness form.  She dropped off an FMLA form to be completed by 04/25/21.  Advised we will call her when it is completed. ?

## 2021-04-04 NOTE — Progress Notes (Signed)
See MD note for nursing evaluation. °

## 2021-04-05 ENCOUNTER — Ambulatory Visit
Admission: RE | Admit: 2021-04-05 | Discharge: 2021-04-05 | Disposition: A | Discharge status: Home / Self Care | Payer: Managed Care, Other (non HMO) | Source: Ambulatory Visit | Attending: Radiation Oncology | Admitting: Radiation Oncology

## 2021-04-05 ENCOUNTER — Telehealth: Payer: Self-pay | Admitting: Oncology

## 2021-04-05 DIAGNOSIS — Z510 Encounter for antineoplastic radiation therapy: Secondary | ICD-10-CM | POA: Insufficient documentation

## 2021-04-05 DIAGNOSIS — C539 Malignant neoplasm of cervix uteri, unspecified: Secondary | ICD-10-CM | POA: Diagnosis present

## 2021-04-05 NOTE — Telephone Encounter (Signed)
Left a message regarding upcoming appointments for weekly labs and to see Dr. Alvy Bimler (advised to check in early for these appointments).  Requested a return call with any questions. ?

## 2021-04-05 NOTE — Progress Notes (Signed)
Pharmacist Chemotherapy Monitoring - Initial Assessment   ? ?Anticipated start date: 04/12/21  ? ?The following has been reviewed per standard work regarding the patient's treatment regimen: ?The patient's diagnosis, treatment plan and drug doses, and organ/hematologic function ?Lab orders and baseline tests specific to treatment regimen  ?The treatment plan start date, drug sequencing, and pre-medications ?Prior authorization status  ?Patient's documented medication list, including drug-drug interaction screen and prescriptions for anti-emetics and supportive care specific to the treatment regimen ?The drug concentrations, fluid compatibility, administration routes, and timing of the medications to be used ?The patient's access for treatment and lifetime cumulative dose history, if applicable  ?The patient's medication allergies and previous infusion related reactions, if applicable  ? ?Changes made to treatment plan:  ?N/A ? ?Follow up needed:  ?Pending authorization for treatment  ? ? ?Romualdo Bolk [place], [place], ?04/05/2021  12:10 PM  ?

## 2021-04-06 ENCOUNTER — Telehealth: Payer: Self-pay | Admitting: Oncology

## 2021-04-06 NOTE — Telephone Encounter (Signed)
Called Narda Rutherford and advised her that her FMLA paperwork is ready and asked if we should fax it for her.  She does want [place] to fax the forms and will pick up the hard copy at her next appointment. Paperwork faxed successfully to the [employer]. ? ?She also asked if she can move her appointment with Dr. Alvy Bimler on 05/23/21 to 05/22/21 because she will be out of town.  Apt rescheduled to 3:00 on 05/22/21.  Lab/port flush moved to 2:15 that day.   ? ?She asked if it is safe to color her hair during treatment.  Advised I will check and call her back. ?

## 2021-04-07 ENCOUNTER — Encounter: Payer: Self-pay | Admitting: Hematology and Oncology

## 2021-04-07 NOTE — Telephone Encounter (Signed)
Called Robin Steele back and advised her not to color her hair during chemotherapy.  Discussed that she can use rinses or sprays that will wash out and that she can resume coloring 2 months after treatment is done.  She verbalized understanding and agreement. ?

## 2021-04-10 ENCOUNTER — Other Ambulatory Visit: Payer: Self-pay | Admitting: Radiology

## 2021-04-11 ENCOUNTER — Ambulatory Visit (HOSPITAL_COMMUNITY)
Admission: RE | Admit: 2021-04-11 | Discharge: 2021-04-11 | Disposition: A | Discharge status: Home / Self Care | Payer: Managed Care, Other (non HMO) | Source: Ambulatory Visit | Attending: Hematology and Oncology | Admitting: Hematology and Oncology

## 2021-04-11 ENCOUNTER — Ambulatory Visit (HOSPITAL_COMMUNITY): Payer: Managed Care, Other (non HMO)

## 2021-04-11 ENCOUNTER — Inpatient Hospital Stay: Payer: Managed Care, Other (non HMO) | Attending: Gynecologic Oncology

## 2021-04-11 ENCOUNTER — Encounter (HOSPITAL_COMMUNITY): Payer: Self-pay

## 2021-04-11 ENCOUNTER — Other Ambulatory Visit: Payer: Self-pay

## 2021-04-11 DIAGNOSIS — Z5111 Encounter for antineoplastic chemotherapy: Secondary | ICD-10-CM | POA: Insufficient documentation

## 2021-04-11 DIAGNOSIS — F419 Anxiety disorder, unspecified: Secondary | ICD-10-CM | POA: Diagnosis not present

## 2021-04-11 DIAGNOSIS — D61810 Antineoplastic chemotherapy induced pancytopenia: Secondary | ICD-10-CM | POA: Insufficient documentation

## 2021-04-11 DIAGNOSIS — K5909 Other constipation: Secondary | ICD-10-CM | POA: Insufficient documentation

## 2021-04-11 DIAGNOSIS — T451X5A Adverse effect of antineoplastic and immunosuppressive drugs, initial encounter: Secondary | ICD-10-CM | POA: Insufficient documentation

## 2021-04-11 DIAGNOSIS — C539 Malignant neoplasm of cervix uteri, unspecified: Secondary | ICD-10-CM | POA: Insufficient documentation

## 2021-04-11 DIAGNOSIS — F32A Depression, unspecified: Secondary | ICD-10-CM | POA: Diagnosis not present

## 2021-04-11 DIAGNOSIS — R197 Diarrhea, unspecified: Secondary | ICD-10-CM | POA: Diagnosis not present

## 2021-04-11 DIAGNOSIS — R7303 Prediabetes: Secondary | ICD-10-CM | POA: Diagnosis not present

## 2021-04-11 DIAGNOSIS — E785 Hyperlipidemia, unspecified: Secondary | ICD-10-CM | POA: Diagnosis not present

## 2021-04-11 DIAGNOSIS — K1231 Oral mucositis (ulcerative) due to antineoplastic therapy: Secondary | ICD-10-CM | POA: Insufficient documentation

## 2021-04-11 DIAGNOSIS — J039 Acute tonsillitis, unspecified: Secondary | ICD-10-CM | POA: Diagnosis not present

## 2021-04-11 DIAGNOSIS — Z79899 Other long term (current) drug therapy: Secondary | ICD-10-CM | POA: Diagnosis not present

## 2021-04-11 HISTORY — PX: IR IMAGING GUIDED PORT INSERTION: IMG5740

## 2021-04-11 LAB — CBC WITH DIFFERENTIAL (CANCER CENTER ONLY)
Abs Immature Granulocytes: 0.01 10*3/uL (ref 0.00–0.07)
Basophils Absolute: 0.1 10*3/uL (ref 0.0–0.1)
Basophils Relative: 1 %
Eosinophils Absolute: 0.2 10*3/uL (ref 0.0–0.5)
Eosinophils Relative: 3 %
HCT: 42.1 % (ref 36.0–46.0)
Hemoglobin: 13.5 g/dL (ref 12.0–15.0)
Immature Granulocytes: 0 %
Lymphocytes Relative: 47 %
Lymphs Abs: 3.4 10*3/uL (ref 0.7–4.0)
MCH: 26.9 pg (ref 26.0–34.0)
MCHC: 32.1 g/dL (ref 30.0–36.0)
MCV: 84 fL (ref 80.0–100.0)
Monocytes Absolute: 0.4 10*3/uL (ref 0.1–1.0)
Monocytes Relative: 6 %
Neutro Abs: 3.1 10*3/uL (ref 1.7–7.7)
Neutrophils Relative %: 43 %
Platelet Count: 305 10*3/uL (ref 150–400)
RBC: 5.01 MIL/uL (ref 3.87–5.11)
RDW: 13.8 % (ref 11.5–15.5)
WBC Count: 7.3 10*3/uL (ref 4.0–10.5)
nRBC: 0 % (ref 0.0–0.2)

## 2021-04-11 LAB — MAGNESIUM: Magnesium: 2.4 mg/dL (ref 1.7–2.4)

## 2021-04-11 LAB — BASIC METABOLIC PANEL - CANCER CENTER ONLY
Anion gap: 5 (ref 5–15)
BUN: 10 mg/dL (ref 6–20)
CO2: 31 mmol/L (ref 22–32)
Calcium: 10.1 mg/dL (ref 8.9–10.3)
Chloride: 105 mmol/L (ref 98–111)
Creatinine: 0.84 mg/dL (ref 0.44–1.00)
GFR, Estimated: >60 mL/min (ref >60)
Glucose, Bld: 78 mg/dL (ref 70–99)
Potassium: 3.6 mmol/L (ref 3.5–5.1)
Sodium: 141 mmol/L (ref 135–145)

## 2021-04-11 MED ORDER — MIDAZOLAM HCL 2 MG/2ML IJ SOLN
INTRAMUSCULAR | Status: AC | PRN
  Administered 2021-04-11 (×4): 1 mg via INTRAVENOUS

## 2021-04-11 MED ORDER — LIDOCAINE-EPINEPHRINE 1 %-1:100000 IJ SOLN
INTRAMUSCULAR | Status: AC | PRN
  Administered 2021-04-11: 20 mL

## 2021-04-11 MED ORDER — FENTANYL CITRATE (PF) 100 MCG/2ML IJ SOLN
INTRAMUSCULAR | Status: AC | PRN
  Administered 2021-04-11 (×2): 50 ug via INTRAVENOUS

## 2021-04-11 MED ORDER — FENTANYL CITRATE (PF) 100 MCG/2ML IJ SOLN
INTRAMUSCULAR | Status: AC
  Filled 2021-04-11: qty 2

## 2021-04-11 MED ORDER — MIDAZOLAM HCL 2 MG/2ML IJ SOLN
INTRAMUSCULAR | Status: AC
  Filled 2021-04-11: qty 4

## 2021-04-11 MED ORDER — SODIUM CHLORIDE 0.9 % IV SOLN: INTRAVENOUS | Status: DC

## 2021-04-11 MED ORDER — LIDOCAINE HCL 1 % IJ SOLN
INTRAMUSCULAR | Status: AC
  Filled 2021-04-11: qty 20

## 2021-04-11 MED ORDER — HEPARIN SOD (PORK) LOCK FLUSH 100 UNIT/ML IV SOLN
INTRAVENOUS | Status: AC
  Filled 2021-04-11: qty 5

## 2021-04-11 NOTE — Discharge Instructions (Signed)
Interventional radiology phone numbers °[phone] °After hours [phone] ° ° ° °You have skin glue (dermabond) over your new port. Do not use the lidocaine cream (EMLA cream) over the skin glue until it has healed. The petroleum in the lidocaine cream will dissolve the skin glue resulting in an infection of your new port. Use ice in a zip lock bag for 1-2 minutes over your new port before the cancer center nurses access your port. ° ° °Implanted Port Insertion, Care After °This sheet gives you information about how to care for yourself after your procedure. Your health care provider may also give you more specific instructions. If you have problems or questions, contact your health care provider. °What can I expect after the procedure? °After the procedure, it is common to have: °Discomfort at the port insertion site. °Bruising on the skin over the port. This should improve over 3-4 days. °Follow these instructions at home: °Port care °After your port is placed, you will get a manufacturer's information card. The card has information about your port. Keep this card with you at all times. °Take care of the port as told by your health care provider. Ask your health care provider if you or a family member can get training for taking care of the port at home. A home health care nurse may also take care of the port. °Make sure to remember what type of port you have. °Incision care °Follow instructions from your health care provider about how to take care of your port insertion site. Make sure you: °Wash your hands with soap and water before and after you change your bandage (dressing). If soap and water are not available, use hand sanitizer. °Change your dressing as told by your health care provider. °Leave skin glue in place. These skin closures may need to stay in place for 2 weeks or longer.  °Check your port insertion site every day for signs of infection. Check for: °Redness, swelling, or pain. °Fluid or  blood. °Warmth. °Pus or a bad smell.  °  °  °Activity °Return to your normal activities as told by your health care provider. Ask your health care provider what activities are safe for you. °Do not lift anything that is heavier than 10 lb (4.5 kg), or the limit that you are told, until your health care provider says that it is safe. °General instructions °Take over-the-counter and prescription medicines only as told by your health care provider. °Do not take baths, swim, or use a hot tub until your health care provider approves.You may remove your dressing tomorrow and shower 24 hours after your procedure. °Do not drive for 24 hours if you were given a sedative during your procedure. °Wear a medical alert bracelet in case of an emergency. This will tell any health care providers that you have a port. °Keep all follow-up visits as told by your health care provider. This is important. °Contact a health care provider if: °You cannot flush your port with saline as directed, or you cannot draw blood from the port. °You have a fever or chills. °You have redness, swelling, or pain around your port insertion site. °You have fluid or blood coming from your port insertion site. °Your port insertion site feels warm to the touch. °You have pus or a bad smell coming from the port insertion site. °Get help right away if: °You have chest pain or shortness of breath. °You have bleeding from your port that you cannot control. °Summary °Take care of   the port as told by your health care provider. Keep the manufacturer's information card with you at all times. °Change your dressing as told by your health care provider. °Contact a health care provider if you have a fever or chills or if you have redness, swelling, or pain around your port insertion site. °Keep all follow-up visits as told by your health care provider. °This information is not intended to replace advice given to you by your health care provider. Make sure you discuss any  questions you have with your health care provider. °Document Revised: 07/23/2017 Document Reviewed: 07/23/2017 °Elsevier Patient Education © 2021 [employer] ° ° ° °Moderate Conscious Sedation, Adult, Care After °This sheet gives you information about how to care for yourself after your procedure. Your health care provider may also give you more specific instructions. If you have problems or questions, contact your health care provider. °What can I expect after the procedure? °After the procedure, it is common to have: °Sleepiness for several hours. °Impaired judgment for several hours. °Difficulty with balance. °Vomiting if you eat too soon. °Follow these instructions at home: °For the time period you were told by your health care provider: °Rest. °Do not participate in activities where you could fall or become injured. °Do not drive or use machinery. °Do not drink alcohol. °Do not take sleeping pills or medicines that cause drowsiness. °Do not make important decisions or sign legal documents. °Do not take care of children on your own.  °  °  °Eating and drinking °Follow the diet recommended by your health care provider. °Drink enough fluid to keep your urine pale yellow. °If you vomit: °Drink water, juice, or soup when you can drink without vomiting. °Make sure you have little or no nausea before eating solid foods.   °General instructions °Take over-the-counter and prescription medicines only as told by your health care provider. °Have a responsible adult stay with you for the time you are told. It is important to have someone help care for you until you are awake and alert. °Do not smoke. °Keep all follow-up visits as told by your health care provider. This is important. °Contact a health care provider if: °You are still sleepy or having trouble with balance after 24 hours. °You feel light-headed. °You keep feeling nauseous or you keep vomiting. °You develop a rash. °You have a fever. °You have redness or  swelling around the IV site. °Get help right away if: °You have trouble breathing. °You have new-onset confusion at home. °Summary °After the procedure, it is common to feel sleepy, have impaired judgment, or feel nauseous if you eat too soon. °Rest after you get home. Know the things you should not do after the procedure. °Follow the diet recommended by your health care provider and drink enough fluid to keep your urine pale yellow. °Get help right away if you have trouble breathing or new-onset confusion at home. °This information is not intended to replace advice given to you by your health care provider. Make sure you discuss any questions you have with your health care provider. °Document Revised: 04/24/2019 Document Reviewed: 11/20/2018 °Elsevier Patient Education © 2021 [employer] Inc.  °

## 2021-04-11 NOTE — Procedures (Signed)
Interventional Radiology Procedure Note  Procedure: Single Lumen Power Port Placement    Access:  Right IJ vein.  Findings: Catheter tip positioned at SVC/RA junction. Port is ready for immediate use.   Complications: None  EBL: < 10 mL  Recommendations:  - Ok to shower in 24 hours - Do not submerge for 7 days - Routine line care   Eulah Walkup T. Dimples Probus, M.D Pager:  [phone]   

## 2021-04-11 NOTE — Consult Note (Signed)
? ?Chief Complaint: ?Patient was seen in consultation today for Port-A-Cath placement ? ?Referring Physician(s): ?Gorsuch,Ni ? ?Supervising Physician: Aletta Edouard ? ?Patient Status: [place] ? ?History of Present Illness: ?Robin Steele is a 56 y.o. female with past medical history significant for anxiety/depression, arthritis, skin cancer, hyperlipidemia, prediabetes and now with newly diagnosed cervical cancer.  She presents today for Port-A-Cath placement to assist with treatment. ? ?Past Medical History:  ?Diagnosis Date  ? Anxiety   ? Arthritis 2011  ? in neck and back  ? Cancer [facility]) 2022  ? skin cancer, 2 areas removed per pt  ? Depression   ? Difficulty sleeping   ? Hyperlipidemia   ? Pre-diabetes 2022  ? borderline, hgba1c 5.7 in august 2022  ? ? ?Past Surgical History:  ?Procedure Laterality Date  ? ANTERIOR CERVICAL DECOMP/DISCECTOMY FUSION N/A 11/18/2020  ? Procedure: CERVICAL FOUR -CERVICAL FIVE  ANTERIOR CERVICAL DISCECTOMY FUSION, ALLOGRAFT, PLATE;  Surgeon: Marybelle Killings, MD;  Location: [facility];  Service: Orthopedics;  Laterality: N/A;  ? BASAL CELL CARCINOMA EXCISION  2022  ? pt has had areas removed in the past as well  ? CERVICAL CONE BIOPSY  02/15/2021  ? HERNIA REPAIR    ? pt states she had an umbilical hernia repair about 25 years ago (around 1997), unsure if repaired with mesh  ? ? ?Allergies: ?Patient has no known allergies. ? ?Medications: ?Prior to Admission medications   ?Medication Sig Start Date End Date Taking? Authorizing Provider  ?Ascorbic Acid (VITAMIN C) 1000 MG tablet Take 1,000 mg by mouth at bedtime.   Yes [provider]  ?Cyanocobalamin (B-12) 5000 MCG CAPS Take 5,000 mcg by mouth at bedtime.   Yes [provider]  ?DULoxetine (CYMBALTA) 60 MG capsule Take by mouth.   Yes [provider]  ?EYSUVIS 0.25 % SUSP Apply to eye. 02/25/21  Yes [provider]  ?Magnesium Oxide 250 MG TABS Take 1 tablet by mouth every morning.   Yes  [provider]  ?Multiple Vitamins-Minerals (CENTRUM SILVER 50+WOMEN) TABS Take 1 tablet by mouth at bedtime.   Yes [provider]  ?Omega-3 1000 MG CAPS Take 1,000 mg by mouth at bedtime.   Yes [provider]  ?Red Yeast Rice 600 MG TABS Take 1,200 mg by mouth at bedtime.   Yes [provider]  ?temazepam (RESTORIL) 30 MG capsule Take by mouth. 06/13/17  Yes [provider]  ?valACYclovir (VALTREX) 500 MG tablet Take 500 mg by mouth at bedtime. 08/05/10  Yes [provider]  ?lidocaine-prilocaine (EMLA) cream Apply to affected area once ?Patient not taking: Reported on 04/04/2021 03/31/21   Heath Lark, MD  ?ondansetron (ZOFRAN) 8 MG tablet Take 1 tablet by mouth 2  times daily as needed. Start on the third day after cisplatin chemotherapy. ?Patient not taking: Reported on 04/04/2021 03/31/21   Heath Lark, MD  ?prochlorperazine (COMPAZINE) 10 MG tablet Take 1 tablet  by mouth every 6  hours as needed (Nausea or vomiting). ?Patient not taking: Reported on 04/04/2021 03/31/21   Heath Lark, MD  ?  ? ?Family History  ?Problem Relation Age of Onset  ? Colon cancer Mother   ?     maligant tumor on arm  ? Endometrial cancer Mother   ? Breast cancer Maternal Aunt   ? Colon cancer Maternal Grandmother   ? Uterine cancer Other   ? Pancreatic cancer Neg Hx   ? Prostate cancer Neg Hx   ?  Ovarian cancer Neg Hx   ? ? ?Social History  ? ?Socioeconomic History  ? Marital status: Single  ?  Spouse name: Not on file  ? Number of children: Not on file  ? Years of education: Not on file  ? Highest education level: Not on file  ?Occupational History  ? Not on file  ?Tobacco Use  ? Smoking status: Never  ? Smokeless tobacco: Never  ?Vaping Use  ? Vaping Use: Never used  ?Substance and Sexual Activity  ? Alcohol use: Yes  ?  Comment: very occasional, maybe once or twice a year (1 glass of wine)  ? Drug use: Never  ? Sexual activity: Yes  ?Other Topics Concern  ? Not on file  ?Social  History Narrative  ? Not on file  ? ?Social Determinants of Health  ? ?Financial Resource Strain: Not on file  ?Food Insecurity: Not on file  ?Transportation Needs: Not on file  ?Physical Activity: Not on file  ?Stress: Not on file  ?Social Connections: Not on file  ? ? ? ? ?Review of Systems currently denies fever, headache, chest pain, dyspnea, cough, abdominal/back pain, nausea, vomiting or bleeding ? ?Vital Signs: ?BP (!) 154/100   Pulse 65   Temp 99.6 ?F (37.6 ?C) (Oral)   Resp 18   Ht '5\' 5"'$  (1.651 m)   Wt 147 lb 4.3 oz (66.8 kg)   LMP 12/18/2018   SpO2 99%   BMI 24.51 kg/m?  ? ?Physical Exam awake, alert.  Chest clear to auscultation bilaterally.  Heart with regular rate and rhythm.  Abdomen soft, positive bowel sounds, nontender.  No lower extremity edema. ? ?Imaging: ?MR Pelvis W Wo Contrast ? ?Addendum Date: 03/30/2021   ?ADDENDUM REPORT: 03/30/2021 13:12 ADDENDUM: Repeat imaging was performed with intersection again showing the tumor biased towards the anterior aspect of the cervix but tracking towards the RIGHT posterolateral cervix with stromal disruption at the level of the inferior posterolateral cervix on the RIGHT (image 19/40) Contrasted imaging (image 20/44) this shows contrast enhancement which follows this pattern. Early extension into the RIGHT posterolateral parametrium at the lower margin of the cervix is demonstrated on both contrasted and precontrast T2 weighted imaging. The lesion involves RIGHT anterolateral 2/3 of the cervix with loss of endocervical distinction noted on image 19 of 40 across the entire interface between the mass and the cervix, cervical disruption of stroma noted RIGHT posterolateral aspect as described. Ovaries are normal. Final impression: Cervical neoplasm with extension into cervical stroma and disruption of cervical stroma most notably along the RIGHT posterolateral margin with early extension into the RIGHT parametrium. Electronically Signed   By: Zetta Bills M.D.   On: 03/30/2021 13:12  ? ?Result Date: 03/30/2021 ?CLINICAL DATA:  A 56 year old female presents for evaluation of cervical cancer. EXAM: MRI PELVIS WITHOUT AND WITH CONTRAST TECHNIQUE: Multiplanar multisequence MR imaging of the pelvis was performed both before and after administration of intravenous contrast. CONTRAST:  7.81m GADAVIST GADOBUTROL 1 MMOL/ML IV SOLN COMPARISON:  PET imaging from March 23, 2021. FINDINGS: Urinary Tract: Yes urinary bladder is smooth with respect to contour in there is no sign of ureteral dilation. Bowel: No signs of acute bowel process to the extent evaluated on this pelvic MRI. Vascular/Lymphatic: No adenopathy in the pelvis. Reproductive: Intermediate signal within the cervical canal along the anterior cervix best seen on image (21/3) this measures approximately 15 x 14 x 13 mm and disrupts the internal cervical stromal ring extending into cervical stroma.  No signs of extension into the upper vagina. Imaging compromised by bowel gas and by motion on the current study unfortunately. This is particularly true of postcontrast and diffusion imaging. Suggestion of enhancement noted within the area despite presence of artifact, anterior and RIGHT cervix at greatest risk. Some enhancement suggested extending into RIGHT parametrium (image 26/20). Some intermediate signal also extends into the lower uterus (image 22/3) this extends to the level of the junction of mid and lower uterine segment. No gross adnexal mass. Ovaries grossly normal but with limited assessment due to poor presence of bowel gas. RIGHT Other:  No ascites. Musculoskeletal: No suspicious bone lesions identified. IMPRESSION: 1. Signs of cervical neoplasm along the anterior and RIGHT lateral cervix with potential extension into RIGHT parametrium in clear disruption of internal endocervical boundaries with involvement of cervical stroma. 2. No signs of adenopathy in the pelvis. 3. Motion artifact and extensive bowel  gas in the area imaging is limited in for this reason the patient will be recall for repeat imaging. An addendum will be rendered to this report when this is complete. Electronically Signed: By: Zetta Bills M.D. On:

## 2021-04-12 ENCOUNTER — Inpatient Hospital Stay: Payer: Managed Care, Other (non HMO)

## 2021-04-12 VITALS — BP 175/92 | HR 71 | Temp 98.5°F | Resp 18 | Wt 148.8 lb

## 2021-04-12 DIAGNOSIS — C539 Malignant neoplasm of cervix uteri, unspecified: Secondary | ICD-10-CM

## 2021-04-12 DIAGNOSIS — Z5111 Encounter for antineoplastic chemotherapy: Secondary | ICD-10-CM | POA: Diagnosis not present

## 2021-04-12 MED ORDER — POTASSIUM CHLORIDE IN NACL 20-0.9 MEQ/L-% IV SOLN
Freq: Once | INTRAVENOUS | Status: AC
  Filled 2021-04-12: qty 1000

## 2021-04-12 MED ORDER — SODIUM CHLORIDE 0.9 % IV SOLN
40.0000 mg/m2 | Freq: Once | INTRAVENOUS | Status: AC
  Administered 2021-04-12: 70 mg via INTRAVENOUS
  Filled 2021-04-12: qty 70

## 2021-04-12 MED ORDER — PALONOSETRON HCL INJECTION 0.25 MG/5ML
0.2500 mg | Freq: Once | INTRAVENOUS | Status: AC
  Administered 2021-04-12: 0.25 mg via INTRAVENOUS
  Filled 2021-04-12: qty 5

## 2021-04-12 MED ORDER — MAGNESIUM SULFATE 2 GM/50ML IV SOLN
2.0000 g | Freq: Once | INTRAVENOUS | Status: AC
  Administered 2021-04-12: 2 g via INTRAVENOUS
  Filled 2021-04-12: qty 50

## 2021-04-12 MED ORDER — SODIUM CHLORIDE 0.9 % IV SOLN
10.0000 mg | Freq: Once | INTRAVENOUS | Status: AC
  Administered 2021-04-12: 10 mg via INTRAVENOUS
  Filled 2021-04-12: qty 10

## 2021-04-12 MED ORDER — SODIUM CHLORIDE 0.9% FLUSH
10.0000 mL | INTRAVENOUS | Status: DC | PRN
  Administered 2021-04-12: 10 mL

## 2021-04-12 MED ORDER — HEPARIN SOD (PORK) LOCK FLUSH 100 UNIT/ML IV SOLN
500.0000 [IU] | Freq: Once | INTRAVENOUS | Status: AC | PRN
  Administered 2021-04-12: 500 [IU]

## 2021-04-12 MED ORDER — SODIUM CHLORIDE 0.9 % IV SOLN: Freq: Once | INTRAVENOUS | Status: AC

## 2021-04-12 MED ORDER — SODIUM CHLORIDE 0.9 % IV SOLN
150.0000 mg | Freq: Once | INTRAVENOUS | Status: AC
  Administered 2021-04-12: 150 mg via INTRAVENOUS
  Filled 2021-04-12: qty 150

## 2021-04-12 NOTE — Patient Instructions (Signed)
[facility]  Discharge Instructions: ?Thank you for choosing [facility] to provide your oncology and hematology care.  ? ?If you have a lab appointment with the [facility], please go directly to the [facility] and check in at the registration area. ?  ?Wear comfortable clothing and clothing appropriate for easy access to any Portacath or PICC line.  ? ?We strive to give you quality time with your provider. You may need to reschedule your appointment if you arrive late (15 or more minutes).  Arriving late affects you and other patients whose appointments are after yours.  Also, if you miss three or more appointments without notifying the office, you may be dismissed from the clinic at the provider?s discretion.    ?  ?For prescription refill requests, have your pharmacy contact our office and allow 72 hours for refills to be completed.   ? ?Today you received the following chemotherapy and/or immunotherapy agents: Cisplatin.    ?  ?To help prevent nausea and vomiting after your treatment, we encourage you to take your nausea medication as directed. ? ?BELOW ARE SYMPTOMS THAT SHOULD BE REPORTED IMMEDIATELY: ?*FEVER GREATER THAN 100.4 F (38 ?C) OR HIGHER ?*CHILLS OR SWEATING ?*NAUSEA AND VOMITING THAT IS NOT CONTROLLED WITH YOUR NAUSEA MEDICATION ?*UNUSUAL SHORTNESS OF BREATH ?*UNUSUAL BRUISING OR BLEEDING ?*URINARY PROBLEMS (pain or burning when urinating, or frequent urination) ?*BOWEL PROBLEMS (unusual diarrhea, constipation, pain near the anus) ?TENDERNESS IN MOUTH AND THROAT WITH OR WITHOUT PRESENCE OF ULCERS (sore throat, sores in mouth, or a toothache) ?UNUSUAL RASH, SWELLING OR PAIN  ?UNUSUAL VAGINAL DISCHARGE OR ITCHING  ? ?Items with * indicate a potential emergency and should be followed up as soon as possible or go to the Emergency Department if any problems should occur. ? ?Please show the CHEMOTHERAPY ALERT CARD or IMMUNOTHERAPY ALERT CARD at check-in to  the Emergency Department and triage nurse. ? ?Should you have questions after your visit or need to cancel or reschedule your appointment, please contact [facility]  Dept: [phone]  and follow the prompts.  Office hours are 8:00 a.m. to 4:30 p.m. Monday - Friday. Please note that voicemails left after 4:00 p.m. may not be returned until the following business day.  We are closed weekends and major holidays. You have access to a nurse at all times for urgent questions. Please call the main number to the clinic Dept: [phone] and follow the prompts. ? ? ?For any non-urgent questions, you may also contact your provider using MyChart. We now offer e-Visits for anyone 46 and older to request care online for non-urgent symptoms. For details visit [email or web address]. ?  ?Also download the MyChart app! Go to the app store, search "MyChart", open the app, select [facility], and log in with your MyChart username and password. ? ?Due to Covid, a mask is required upon entering the hospital/clinic. If you do not have a mask, one will be given to you upon arrival. For doctor visits, patients may have 1 support person aged 45 or older with them. For treatment visits, patients cannot have anyone with them due to current Covid guidelines and our immunocompromised population.  ? ?Cisplatin injection ?What is this medication? ?CISPLATIN (SIS pla tin) is a chemotherapy drug. It targets fast dividing cells, like cancer cells, and causes these cells to die. This medicine is used to treat many types of cancer like bladder, ovarian, and testicular cancers. ?This medicine may be used  for other purposes; ask your health care provider or pharmacist if you have questions. ?COMMON BRAND NAME(S): Platinol, Platinol -AQ ?What should I tell my care team before I take this medication? ?They need to know if you have any of these conditions: ?eye disease, vision problems ?hearing problems ?kidney  disease ?low blood counts, like white cells, platelets, or red blood cells ?tingling of the fingers or toes, or other nerve disorder ?an unusual or allergic reaction to cisplatin, carboplatin, oxaliplatin, other medicines, foods, dyes, or preservatives ?pregnant or trying to get pregnant ?breast-feeding ?How should I use this medication? ?This drug is given as an infusion into a vein. It is administered in a hospital or clinic by a specially trained health care professional. ?Talk to your pediatrician regarding the use of this medicine in children. Special care may be needed. ?Overdosage: If you think you have taken too much of this medicine contact a poison control center or emergency room at once. ?NOTE: This medicine is only for you. Do not share this medicine with others. ?What if I miss a dose? ?It is important not to miss a dose. Call your doctor or health care professional if you are unable to keep an appointment. ?What may interact with this medication? ?This medicine may interact with the following medications: ?foscarnet ?certain antibiotics like amikacin, gentamicin, neomycin, polymyxin B, streptomycin, tobramycin, vancomycin ?This list may not describe all possible interactions. Give your health care provider a list of all the medicines, herbs, non-prescription drugs, or dietary supplements you use. Also tell them if you smoke, drink alcohol, or use illegal drugs. Some items may interact with your medicine. ?What should I watch for while using this medication? ?Your condition will be monitored carefully while you are receiving this medicine. You will need important blood work done while you are taking this medicine. ?This drug may make you feel generally unwell. This is not uncommon, as chemotherapy can affect healthy cells as well as cancer cells. Report any side effects. Continue your course of treatment even though you feel ill unless your doctor tells you to stop. ?This medicine may increase your  risk of getting an infection. Call your healthcare professional for advice if you get a fever, chills, or sore throat, or other symptoms of a cold or flu. Do not treat yourself. Try to avoid being around people who are sick. ?Avoid taking medicines that contain aspirin, acetaminophen, ibuprofen, naproxen, or ketoprofen unless instructed by your healthcare professional. These medicines may hide a fever. ?This medicine may increase your risk to bruise or bleed. Call your doctor or health care professional if you notice any unusual bleeding. ?Be careful brushing and flossing your teeth or using a toothpick because you may get an infection or bleed more easily. If you have any dental work done, tell your dentist you are receiving this medicine. ?Do not become pregnant while taking this medicine or for 14 months after stopping it. Women should inform their healthcare professional if they wish to become pregnant or think they might be pregnant. Men should not father a child while taking this medicine and for 11 months after stopping it. There is potential for serious side effects to an unborn child. Talk to your healthcare professional for more information. ?Do not breast-feed an infant while taking this medicine. ?This medicine has caused ovarian failure in some women. This medicine may make it more difficult to get pregnant. Talk to your healthcare professional if you are concerned about your fertility. ?This medicine has caused  decreased sperm counts in some men. This may make it more difficult to father a child. Talk to your healthcare professional if you are concerned about your fertility. ?Drink fluids as directed while you are taking this medicine. This will help protect your kidneys. ?Call your doctor or health care professional if you get diarrhea. Do not treat yourself. ?What side effects may I notice from receiving this medication? ?Side effects that you should report to your doctor or health care professional  as soon as possible: ?allergic reactions like skin rash, itching or hives, swelling of the face, lips, or tongue ?blurred vision ?changes in vision ?decreased hearing or ringing of the ears ?nausea, vomiting ?pa

## 2021-04-13 ENCOUNTER — Other Ambulatory Visit: Payer: Self-pay

## 2021-04-13 ENCOUNTER — Telehealth: Payer: Self-pay

## 2021-04-13 ENCOUNTER — Ambulatory Visit
Admission: RE | Admit: 2021-04-13 | Discharge: 2021-04-13 | Disposition: A | Discharge status: Home / Self Care | Payer: Managed Care, Other (non HMO) | Source: Ambulatory Visit | Attending: Radiation Oncology | Admitting: Radiation Oncology

## 2021-04-13 DIAGNOSIS — Z5111 Encounter for antineoplastic chemotherapy: Secondary | ICD-10-CM | POA: Diagnosis not present

## 2021-04-13 DIAGNOSIS — C539 Malignant neoplasm of cervix uteri, unspecified: Secondary | ICD-10-CM

## 2021-04-13 NOTE — Telephone Encounter (Signed)
-----   Message from Rafael Bihari, RN sent at 04/12/2021  3:37 PM EDT ----- ?Regarding: Dr. Alvy Bimler pt, first time Cisplatin ?Dr Alvy Bimler pt came in today for first time Cisplatin. Tolerated infusion well. Needs call back. ? ?

## 2021-04-13 NOTE — Telephone Encounter (Signed)
Ms Huey Bienenstock states that she is eating, drinking, and urinating well. No concerns noted. ?She knows to call the office  at [phone] if she has any questions or concerns. ?

## 2021-04-14 ENCOUNTER — Ambulatory Visit
Admission: RE | Admit: 2021-04-14 | Discharge: 2021-04-14 | Disposition: A | Discharge status: Home / Self Care | Payer: Managed Care, Other (non HMO) | Source: Ambulatory Visit | Attending: Radiation Oncology | Admitting: Radiation Oncology

## 2021-04-14 DIAGNOSIS — Z5111 Encounter for antineoplastic chemotherapy: Secondary | ICD-10-CM | POA: Diagnosis not present

## 2021-04-17 ENCOUNTER — Ambulatory Visit
Admission: RE | Admit: 2021-04-17 | Discharge: 2021-04-17 | Disposition: A | Discharge status: Home / Self Care | Payer: Managed Care, Other (non HMO) | Source: Ambulatory Visit | Attending: Radiation Oncology | Admitting: Radiation Oncology

## 2021-04-17 ENCOUNTER — Inpatient Hospital Stay: Payer: Managed Care, Other (non HMO)

## 2021-04-17 ENCOUNTER — Other Ambulatory Visit: Payer: Self-pay

## 2021-04-17 ENCOUNTER — Ambulatory Visit: Payer: Managed Care, Other (non HMO) | Admitting: Radiation Oncology

## 2021-04-17 DIAGNOSIS — Z5111 Encounter for antineoplastic chemotherapy: Secondary | ICD-10-CM | POA: Diagnosis not present

## 2021-04-17 DIAGNOSIS — C539 Malignant neoplasm of cervix uteri, unspecified: Secondary | ICD-10-CM

## 2021-04-17 LAB — CBC WITH DIFFERENTIAL (CANCER CENTER ONLY)
Abs Immature Granulocytes: 0.01 10*3/uL (ref 0.00–0.07)
Basophils Absolute: 0 10*3/uL (ref 0.0–0.1)
Basophils Relative: 0 %
Eosinophils Absolute: 0.2 10*3/uL (ref 0.0–0.5)
Eosinophils Relative: 3 %
HCT: 40 % (ref 36.0–46.0)
Hemoglobin: 12.9 g/dL (ref 12.0–15.0)
Immature Granulocytes: 0 %
Lymphocytes Relative: 41 %
Lymphs Abs: 2.8 10*3/uL (ref 0.7–4.0)
MCH: 26.7 pg (ref 26.0–34.0)
MCHC: 32.3 g/dL (ref 30.0–36.0)
MCV: 82.8 fL (ref 80.0–100.0)
Monocytes Absolute: 0.4 10*3/uL (ref 0.1–1.0)
Monocytes Relative: 6 %
Neutro Abs: 3.5 10*3/uL (ref 1.7–7.7)
Neutrophils Relative %: 50 %
Platelet Count: 308 10*3/uL (ref 150–400)
RBC: 4.83 MIL/uL (ref 3.87–5.11)
RDW: 13.2 % (ref 11.5–15.5)
WBC Count: 6.9 10*3/uL (ref 4.0–10.5)
nRBC: 0 % (ref 0.0–0.2)

## 2021-04-17 LAB — BASIC METABOLIC PANEL - CANCER CENTER ONLY
Anion gap: 6 (ref 5–15)
BUN: 13 mg/dL (ref 6–20)
CO2: 28 mmol/L (ref 22–32)
Calcium: 9.1 mg/dL (ref 8.9–10.3)
Chloride: 103 mmol/L (ref 98–111)
Creatinine: 0.82 mg/dL (ref 0.44–1.00)
GFR, Estimated: >60 mL/min (ref >60)
Glucose, Bld: 100 mg/dL — ABNORMAL HIGH (ref 70–99)
Potassium: 3.9 mmol/L (ref 3.5–5.1)
Sodium: 137 mmol/L (ref 135–145)

## 2021-04-17 LAB — MAGNESIUM: Magnesium: 2.3 mg/dL (ref 1.7–2.4)

## 2021-04-17 MED ORDER — HEPARIN SOD (PORK) LOCK FLUSH 100 UNIT/ML IV SOLN
500.0000 [IU] | Freq: Once | INTRAVENOUS | Status: AC
  Administered 2021-04-17: 500 [IU]

## 2021-04-17 MED ORDER — SODIUM CHLORIDE 0.9% FLUSH
10.0000 mL | Freq: Once | INTRAVENOUS | Status: AC
  Administered 2021-04-17: 10 mL

## 2021-04-18 ENCOUNTER — Encounter: Payer: Self-pay | Admitting: Oncology

## 2021-04-18 ENCOUNTER — Encounter: Payer: Self-pay | Admitting: Hematology and Oncology

## 2021-04-18 ENCOUNTER — Inpatient Hospital Stay: Payer: Managed Care, Other (non HMO) | Admitting: Hematology and Oncology

## 2021-04-18 ENCOUNTER — Ambulatory Visit
Admission: RE | Admit: 2021-04-18 | Discharge: 2021-04-18 | Disposition: A | Discharge status: Home / Self Care | Payer: Managed Care, Other (non HMO) | Source: Ambulatory Visit | Attending: Radiation Oncology | Admitting: Radiation Oncology

## 2021-04-18 VITALS — BP 139/77 | HR 73 | Temp 98.3°F | Resp 18 | Ht 65.0 in | Wt 145.2 lb

## 2021-04-18 DIAGNOSIS — C539 Malignant neoplasm of cervix uteri, unspecified: Secondary | ICD-10-CM

## 2021-04-18 DIAGNOSIS — Z5111 Encounter for antineoplastic chemotherapy: Secondary | ICD-10-CM | POA: Diagnosis not present

## 2021-04-18 DIAGNOSIS — B9689 Other specified bacterial agents as the cause of diseases classified elsewhere: Secondary | ICD-10-CM

## 2021-04-18 DIAGNOSIS — K5909 Other constipation: Secondary | ICD-10-CM | POA: Diagnosis not present

## 2021-04-18 DIAGNOSIS — J038 Acute tonsillitis due to other specified organisms: Secondary | ICD-10-CM

## 2021-04-18 MED ORDER — AMOXICILLIN 500 MG PO TABS: 500.0000 mg | ORAL_TABLET | Freq: Two times a day (BID) | ORAL | 0 refills | Status: DC

## 2021-04-18 NOTE — Assessment & Plan Note (Signed)
Examination of her oropharynx revealed abnormalities suspicious for acute tonsillitis ?I recommend amoxicillin ?This will not preclude treatment for tomorrow ?She will proceed with treatment as scheduled ?

## 2021-04-18 NOTE — Assessment & Plan Note (Signed)
Overall, she tolerated first dose of treatment well except for discomfort near her port site and abnormal sensation in her throat ?She had slight constipation ?We will proceed with treatment tomorrow ?I will continue to see her on a weekly basis for toxicity review ?

## 2021-04-18 NOTE — Progress Notes (Signed)
Faxed clarification of intermittent leave to the [facility]. ?

## 2021-04-18 NOTE — Assessment & Plan Note (Signed)
We discussed risk of constipation while on treatment ?I recommend daily laxative therapy ?

## 2021-04-18 NOTE — Progress Notes (Signed)
[facility] [place] ?OFFICE PROGRESS NOTE ? ?Patient Care Team: ?Rosine Door as PCP - General (Physician Assistant) ?Janina Mayo, MD as PCP - Cardiology (Cardiology) ?Curlene Labrum, MD as Physician Assistant (Family Medicine) ?Awanda Mink Craige Cotta, RN as Oncology Nurse Navigator (Oncology) ? 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 ?Acute bacterial tonsillitis ?Examination of her oropharynx revealed abnormalities suspicious for acute tonsillitis ?I recommend amoxicillin ?This will not preclude treatment for tomorrow ?She will proceed with treatment as scheduled ? ?Other constipation ?We discussed risk of constipation while on treatment ?I recommend daily laxative therapy ? ?No orders of the defined types were placed in this encounter. ? ? ?All questions were answered. The patient knows to call the clinic with any problems, questions or concerns. ?The total time spent in the appointment was 30 minutes encounter with patients including review of chart and various tests results, discussions about plan of care and coordination of care plan ?  ?Heath Lark, MD ?04/18/2021 4:23 PM ? ?INTERVAL HISTORY: ?Please see below for problem oriented charting. ?she returns for treatment follow-up seen prior to cycle 2 of treatment ?Since last time I saw her, she complained of some discomfort over her port site as well as constipation requiring laxatives ?She denies neuropathy ?She has some abnormal throat sensation ?Denies fever or chills ? ?REVIEW OF SYSTEMS:   ?Constitutional: Denies fevers, chills or abnormal weight loss ?Eyes: Denies blurriness of vision ?Respiratory: Denies cough, dyspnea or wheezes ?Cardiovascular: Denies  palpitation, chest discomfort or lower extremity swelling ?Gastrointestinal:  Denies nausea, heartburn or change in bowel habits ?Skin: Denies abnormal skin rashes ?Lymphatics: Denies new lymphadenopathy or easy bruising ?Neurological:Denies numbness, tingling or new weaknesses ?Behavioral/Psych: Mood is stable, no new changes  ?All other systems were reviewed with the patient and are negative. ? ?I have reviewed the past medical history, past surgical history, social history and family history with the patient and they are unchanged from previous note. ? ?ALLERGIES:  has No Known Allergies. ? ?MEDICATIONS:  ?Current Outpatient Medications  ?Medication Sig Dispense Refill  ? amoxicillin (AMOXIL) 500 MG tablet Take 1 tablet (500 mg total) by mouth 2 (two) times daily. 14 tablet 0  ? Ascorbic Acid (VITAMIN C) 1000 MG tablet Take 1,000 mg by mouth at bedtime.    ? Cyanocobalamin (B-12) 5000 MCG CAPS Take 5,000 mcg by mouth at bedtime.    ? DULoxetine (CYMBALTA) 60 MG capsule Take by mouth.    ? EYSUVIS 0.25 % SUSP Apply to eye.    ? lidocaine-prilocaine (EMLA) cream Apply to affected area once (Patient not taking: Reported on 04/04/2021) 30 g 3  ? Magnesium Oxide 250 MG TABS Take 1 tablet by mouth every morning.    ? Multiple Vitamins-Minerals (CENTRUM SILVER 50+WOMEN) TABS Take 1 tablet by mouth at bedtime.    ? Omega-3 1000 MG CAPS Take 1,000 mg by mouth at bedtime.    ? ondansetron (ZOFRAN) 8 MG tablet Take 1 tablet by mouth 2  times daily as needed. Start on the third day after cisplatin chemotherapy. (Patient not taking: Reported on 04/04/2021) 30 tablet 1  ? prochlorperazine (COMPAZINE) 10 MG tablet Take 1 tablet  by mouth every 6  hours as needed (Nausea  or vomiting). (Patient not taking: Reported on 04/04/2021) 30 tablet 1  ? Red Yeast Rice 600 MG TABS Take 1,200 mg by mouth at bedtime.    ? temazepam (RESTORIL) 30 MG capsule Take by mouth.    ? valACYclovir (VALTREX) 500 MG tablet Take 500 mg by mouth at bedtime.     ? ?No current facility-administered medications for this visit.  ? ? ?SUMMARY OF ONCOLOGIC HISTORY: ?Oncology History  ?Cervical adenocarcinoma ([place])  ?09/14/2020 Initial Diagnosis  ? The patient has a history of prior abnormal paps, did not require biopsies are procedures, normalized. Has previously been HPV+.  More recent history is as follows: ?Pap 08/2020: ASC-H, HR HPV positive ?Colposcopy 09/14/20: ECC negative, CIN3 on cervical biopsies ?  ?02/15/2021 Pathology Results  ? [id number] ? ?Cone biopsy of cervix showed invasive adenocarcinoma, 2.1 cm in maximum dimension, 1.5 cm depth of invasion.  Endocervical and deep margins are involved.  High-grade squamous intraepithelial lesion with involvement of endocervical glands were present.  Endocervix curettage show detached fragment of adenocarcinoma. ?P16 is strongly positive. ?  ?03/09/2021 Initial Diagnosis  ? Cervical adenocarcinoma ([place]) ?  ?03/24/2021 PET scan  ? 1. Moderate activity in the cervix with maximum SUV of 5.6. Some of this could be postprocedural/inflammatory related to recent conization, but residual tumor is not excluded. No findings of ?distant metastatic spread or hypermetabolic adenopathy. ?  ?03/30/2021 Imaging  ? MRI pelvis ?Repeat imaging was performed with intersection again showing the tumor biased towards the anterior aspect of the cervix but tracking towards the RIGHT posterolateral cervix with stromal disruption at the level of the inferior posterolateral cervix on the RIGHT (image 19/40) ?  ?Contrasted imaging (image 20/44) this shows contrast enhancement which follows this pattern. Early extension into the RIGHT posterolateral parametrium at the lower margin of the cervix is demonstrated on both contrasted and precontrast T2 weighted imaging. The lesion involves RIGHT anterolateral 2/3 of the cervix with loss of endocervical distinction noted on image 19 of 40 across the entire interface between the mass and the cervix, cervical ?disruption  of stroma noted RIGHT posterolateral aspect as described.   ?Ovaries are normal. ?  ?Final impression: Cervical neoplasm with extension into cervical stroma and disruption of cervical stroma most notably along the RIGHT posterolateral margin with early extension into the RIGHT parametrium. ?  ?  ?03/31/2021 Cancer Staging  ? Staging form: Cervix Uteri, AJCC Version 9 ?- Clinical stage from 03/31/2021: Stage IIB (cT2b, cN0, cM0) - Signed by Heath Lark, MD on 03/31/2021 ?Stage prefix: Initial diagnosis ? ?  ?04/12/2021 -  Chemotherapy  ? Patient is on Treatment Plan : Cervical Cisplatin q7d  ?   ?04/12/2021 Procedure  ? Placement of single lumen port a cath via right internal jugular vein. The catheter tip lies at the cavo-atrial junction. A power injectable port a cath was placed and is ready for immediate use. ?  ? ? ?PHYSICAL EXAMINATION: ?ECOG PERFORMANCE STATUS: 1 - Symptomatic but completely ambulatory ? ?Vitals:  ? 04/18/21 1501  ?BP: 139/77  ?Pulse: 73  ?Resp: 18  ?Temp: 98.3 ?F (36.8 ?C)  ?SpO2: 100%  ? ?Filed Weights  ? 04/18/21 1501  ?Weight: 145 lb 3.2 oz (65.9 kg)  ? ? ?GENERAL:alert, no distress and comfortable ?SKIN: skin color, texture, turgor are normal, no rashes or significant lesions ?EYES: normal, Conjunctiva are pink and non-injected, sclera clear ?OROPHARYNX: Abnormal tonsil region suspicious for acute tonsillitis ?NEURO: alert & oriented x 3 with fluent speech, no focal  motor/sensory deficits ? ?LABORATORY DATA:  ?I have reviewed the data as listed ?   ?Component Value Date/Time  ? NA 137 04/17/2021 1522  ? K 3.9 04/17/2021 1522  ? CL 103 04/17/2021 1522  ? CO2 28 04/17/2021 1522  ? GLUCOSE 100 (H) 04/17/2021 1522  ? BUN 13 04/17/2021 1522  ? CREATININE 0.82 04/17/2021 1522  ? CALCIUM 9.1 04/17/2021 1522  ? GFRNONAA >60 04/17/2021 1522  ? ? ?No results found for: SPEP, UPEP ? ?Lab Results  ?Component Value Date  ? WBC 6.9 04/17/2021  ? NEUTROABS 3.5 04/17/2021  ? HGB 12.9 04/17/2021  ? HCT 40.0  04/17/2021  ? MCV 82.8 04/17/2021  ? PLT 308 04/17/2021  ? ? ?  Chemistry   ?   ?Component Value Date/Time  ? NA 137 04/17/2021 1522  ? K 3.9 04/17/2021 1522  ? CL 103 04/17/2021 1522  ? CO2 28 04/17/2021 1522  ?

## 2021-04-19 ENCOUNTER — Other Ambulatory Visit (HOSPITAL_COMMUNITY): Payer: Self-pay | Admitting: Radiation Oncology

## 2021-04-19 ENCOUNTER — Other Ambulatory Visit: Payer: Self-pay | Admitting: Radiation Oncology

## 2021-04-19 ENCOUNTER — Inpatient Hospital Stay: Payer: Managed Care, Other (non HMO)

## 2021-04-19 ENCOUNTER — Ambulatory Visit
Admission: RE | Admit: 2021-04-19 | Discharge: 2021-04-19 | Disposition: A | Discharge status: Home / Self Care | Payer: Managed Care, Other (non HMO) | Source: Ambulatory Visit | Attending: Radiation Oncology | Admitting: Radiation Oncology

## 2021-04-19 ENCOUNTER — Other Ambulatory Visit: Payer: Self-pay

## 2021-04-19 VITALS — BP 131/77 | HR 72 | Temp 97.8°F | Resp 16

## 2021-04-19 DIAGNOSIS — C539 Malignant neoplasm of cervix uteri, unspecified: Secondary | ICD-10-CM

## 2021-04-19 DIAGNOSIS — Z5111 Encounter for antineoplastic chemotherapy: Secondary | ICD-10-CM | POA: Diagnosis not present

## 2021-04-19 MED ORDER — MAGNESIUM SULFATE 2 GM/50ML IV SOLN
2.0000 g | Freq: Once | INTRAVENOUS | Status: AC
  Administered 2021-04-19: 2 g via INTRAVENOUS
  Filled 2021-04-19: qty 50

## 2021-04-19 MED ORDER — SODIUM CHLORIDE 0.9 % IV SOLN
10.0000 mg | Freq: Once | INTRAVENOUS | Status: AC
  Administered 2021-04-19: 10 mg via INTRAVENOUS
  Filled 2021-04-19: qty 10

## 2021-04-19 MED ORDER — HEPARIN SOD (PORK) LOCK FLUSH 100 UNIT/ML IV SOLN
500.0000 [IU] | Freq: Once | INTRAVENOUS | Status: AC | PRN
  Administered 2021-04-19: 500 [IU]

## 2021-04-19 MED ORDER — SODIUM CHLORIDE 0.9 % IV SOLN
40.0000 mg/m2 | Freq: Once | INTRAVENOUS | Status: AC
  Administered 2021-04-19: 70 mg via INTRAVENOUS
  Filled 2021-04-19: qty 70

## 2021-04-19 MED ORDER — SODIUM CHLORIDE 0.9 % IV SOLN: Freq: Once | INTRAVENOUS | Status: AC

## 2021-04-19 MED ORDER — POTASSIUM CHLORIDE IN NACL 20-0.9 MEQ/L-% IV SOLN
Freq: Once | INTRAVENOUS | Status: AC
  Filled 2021-04-19: qty 1000

## 2021-04-19 MED ORDER — SODIUM CHLORIDE 0.9 % IV SOLN
150.0000 mg | Freq: Once | INTRAVENOUS | Status: AC
  Administered 2021-04-19: 150 mg via INTRAVENOUS
  Filled 2021-04-19: qty 150

## 2021-04-19 MED ORDER — PALONOSETRON HCL INJECTION 0.25 MG/5ML
0.2500 mg | Freq: Once | INTRAVENOUS | Status: AC
  Administered 2021-04-19: 0.25 mg via INTRAVENOUS
  Filled 2021-04-19: qty 5

## 2021-04-19 MED ORDER — SODIUM CHLORIDE 0.9% FLUSH
10.0000 mL | INTRAVENOUS | Status: DC | PRN
  Administered 2021-04-19: 10 mL

## 2021-04-19 NOTE — Patient Instructions (Signed)
[facility]  Discharge Instructions: Thank you for choosing [facility] to provide your oncology and hematology care.   If you have a lab appointment with the Cancer Center, please go directly to the Cancer Center and check in at the registration area.   Wear comfortable clothing and clothing appropriate for easy access to any Portacath or PICC line.   We strive to give you quality time with your provider. You may need to reschedule your appointment if you arrive late (15 or more minutes).  Arriving late affects you and other patients whose appointments are after yours.  Also, if you miss three or more appointments without notifying the office, you may be dismissed from the clinic at the provider's discretion.      For prescription refill requests, have your pharmacy contact our office and allow 72 hours for refills to be completed.    Today you received the following chemotherapy and/or immunotherapy agents : Cisplatin    To help prevent nausea and vomiting after your treatment, we encourage you to take your nausea medication as directed.  BELOW ARE SYMPTOMS THAT SHOULD BE REPORTED IMMEDIATELY: *FEVER GREATER THAN 100.4 F (38 C) OR HIGHER *CHILLS OR SWEATING *NAUSEA AND VOMITING THAT IS NOT CONTROLLED WITH YOUR NAUSEA MEDICATION *UNUSUAL SHORTNESS OF BREATH *UNUSUAL BRUISING OR BLEEDING *URINARY PROBLEMS (pain or burning when urinating, or frequent urination) *BOWEL PROBLEMS (unusual diarrhea, constipation, pain near the anus) TENDERNESS IN MOUTH AND THROAT WITH OR WITHOUT PRESENCE OF ULCERS (sore throat, sores in mouth, or a toothache) UNUSUAL RASH, SWELLING OR PAIN  UNUSUAL VAGINAL DISCHARGE OR ITCHING   Items with * indicate a potential emergency and should be followed up as soon as possible or go to the Emergency Department if any problems should occur.  Please show the CHEMOTHERAPY ALERT CARD or IMMUNOTHERAPY ALERT CARD at check-in to  the Emergency Department and triage nurse.  Should you have questions after your visit or need to cancel or reschedule your appointment, please contact [facility]  Dept: [phone]  and follow the prompts.  Office hours are 8:00 a.m. to 4:30 p.m. Monday - Friday. Please note that voicemails left after 4:00 p.m. may not be returned until the following business day.  We are closed weekends and major holidays. You have access to a nurse at all times for urgent questions. Please call the main number to the clinic Dept: [phone] and follow the prompts.   For any non-urgent questions, you may also contact your provider using MyChart. We now offer e-Visits for anyone 18 and older to request care online for non-urgent symptoms. For details visit [email or web address].   Also download the MyChart app! Go to the app store, search "MyChart", open the app, select [facility], and log in with your MyChart username and password.  Due to Covid, a mask is required upon entering the hospital/clinic. If you do not have a mask, one will be given to you upon arrival. For doctor visits, patients may have 1 support person aged 18 or older with them. For treatment visits, patients cannot have anyone with them due to current Covid guidelines and our immunocompromised population.   

## 2021-04-20 ENCOUNTER — Encounter: Payer: Self-pay | Admitting: Hematology and Oncology

## 2021-04-20 ENCOUNTER — Other Ambulatory Visit: Payer: Self-pay

## 2021-04-20 ENCOUNTER — Ambulatory Visit
Admission: RE | Admit: 2021-04-20 | Discharge: 2021-04-20 | Disposition: A | Discharge status: Home / Self Care | Payer: Managed Care, Other (non HMO) | Source: Ambulatory Visit | Attending: Radiation Oncology | Admitting: Radiation Oncology

## 2021-04-20 DIAGNOSIS — Z5111 Encounter for antineoplastic chemotherapy: Secondary | ICD-10-CM | POA: Diagnosis not present

## 2021-04-20 NOTE — Progress Notes (Signed)
Received email from patient after receiving my card per my request. ? ?Called patient to introduce myself as Arboriculturist and to offer available resources. Discussed one-time $1000 Radio broadcast assistant to assist with personal expenses while going through treatment. Based on verbal income provided, she exceeds the income limit and states she is salary therefore her income will not change. She verbalized understanding that she does not qualify for the grant. ? ?Discussed ded/OOP for insurance and if copay assistance is available, she may apply and I would apply on her behalf. ? ?Discussed [place] whom assists patients whom live in [place]. Emailed application to her if interested in applying and advised how it can be returned along with supporting documents. ? ?She has my card for any additional financial questions or concerns. ?

## 2021-04-21 ENCOUNTER — Ambulatory Visit
Admission: RE | Admit: 2021-04-21 | Discharge: 2021-04-21 | Disposition: A | Discharge status: Home / Self Care | Payer: Managed Care, Other (non HMO) | Source: Ambulatory Visit | Attending: Radiation Oncology | Admitting: Radiation Oncology

## 2021-04-21 DIAGNOSIS — Z5111 Encounter for antineoplastic chemotherapy: Secondary | ICD-10-CM | POA: Diagnosis not present

## 2021-04-24 ENCOUNTER — Ambulatory Visit
Admission: RE | Admit: 2021-04-24 | Discharge: 2021-04-24 | Disposition: A | Discharge status: Home / Self Care | Payer: Managed Care, Other (non HMO) | Source: Ambulatory Visit | Attending: Radiation Oncology | Admitting: Radiation Oncology

## 2021-04-24 ENCOUNTER — Inpatient Hospital Stay: Payer: Managed Care, Other (non HMO)

## 2021-04-24 ENCOUNTER — Other Ambulatory Visit: Payer: Self-pay

## 2021-04-24 DIAGNOSIS — C539 Malignant neoplasm of cervix uteri, unspecified: Secondary | ICD-10-CM

## 2021-04-24 DIAGNOSIS — Z5111 Encounter for antineoplastic chemotherapy: Secondary | ICD-10-CM | POA: Diagnosis not present

## 2021-04-24 LAB — CBC WITH DIFFERENTIAL (CANCER CENTER ONLY)
Abs Immature Granulocytes: 0.01 10*3/uL (ref 0.00–0.07)
Basophils Absolute: 0 10*3/uL (ref 0.0–0.1)
Basophils Relative: 0 %
Eosinophils Absolute: 0.1 10*3/uL (ref 0.0–0.5)
Eosinophils Relative: 3 %
HCT: 38 % (ref 36.0–46.0)
Hemoglobin: 12.6 g/dL (ref 12.0–15.0)
Immature Granulocytes: 0 %
Lymphocytes Relative: 30 %
Lymphs Abs: 1.4 10*3/uL (ref 0.7–4.0)
MCH: 27.4 pg (ref 26.0–34.0)
MCHC: 33.2 g/dL (ref 30.0–36.0)
MCV: 82.6 fL (ref 80.0–100.0)
Monocytes Absolute: 0.4 10*3/uL (ref 0.1–1.0)
Monocytes Relative: 8 %
Neutro Abs: 2.7 10*3/uL (ref 1.7–7.7)
Neutrophils Relative %: 59 %
Platelet Count: 245 10*3/uL (ref 150–400)
RBC: 4.6 MIL/uL (ref 3.87–5.11)
RDW: 12.9 % (ref 11.5–15.5)
WBC Count: 4.6 10*3/uL (ref 4.0–10.5)
nRBC: 0 % (ref 0.0–0.2)

## 2021-04-24 LAB — BASIC METABOLIC PANEL - CANCER CENTER ONLY
Anion gap: 5 (ref 5–15)
BUN: 12 mg/dL (ref 6–20)
CO2: 28 mmol/L (ref 22–32)
Calcium: 8.7 mg/dL — ABNORMAL LOW (ref 8.9–10.3)
Chloride: 105 mmol/L (ref 98–111)
Creatinine: 0.63 mg/dL (ref 0.44–1.00)
GFR, Estimated: >60 mL/min (ref >60)
Glucose, Bld: 108 mg/dL — ABNORMAL HIGH (ref 70–99)
Potassium: 3.7 mmol/L (ref 3.5–5.1)
Sodium: 138 mmol/L (ref 135–145)

## 2021-04-24 LAB — MAGNESIUM: Magnesium: 2.2 mg/dL (ref 1.7–2.4)

## 2021-04-24 MED ORDER — SODIUM CHLORIDE 0.9% FLUSH
10.0000 mL | Freq: Once | INTRAVENOUS | Status: AC
  Administered 2021-04-24: 10 mL

## 2021-04-24 MED ORDER — HEPARIN SOD (PORK) LOCK FLUSH 100 UNIT/ML IV SOLN
500.0000 [IU] | Freq: Once | INTRAVENOUS | Status: AC
  Administered 2021-04-24: 500 [IU]

## 2021-04-25 ENCOUNTER — Ambulatory Visit
Admission: RE | Admit: 2021-04-25 | Discharge: 2021-04-25 | Disposition: A | Discharge status: Home / Self Care | Payer: Managed Care, Other (non HMO) | Source: Ambulatory Visit | Attending: Radiation Oncology | Admitting: Radiation Oncology

## 2021-04-25 ENCOUNTER — Other Ambulatory Visit: Payer: Self-pay

## 2021-04-25 ENCOUNTER — Other Ambulatory Visit (HOSPITAL_COMMUNITY): Payer: Self-pay

## 2021-04-25 ENCOUNTER — Inpatient Hospital Stay (HOSPITAL_BASED_OUTPATIENT_CLINIC_OR_DEPARTMENT_OTHER): Payer: Managed Care, Other (non HMO) | Admitting: Hematology and Oncology

## 2021-04-25 DIAGNOSIS — R197 Diarrhea, unspecified: Secondary | ICD-10-CM

## 2021-04-25 DIAGNOSIS — C539 Malignant neoplasm of cervix uteri, unspecified: Secondary | ICD-10-CM

## 2021-04-25 DIAGNOSIS — K1231 Oral mucositis (ulcerative) due to antineoplastic therapy: Secondary | ICD-10-CM | POA: Diagnosis not present

## 2021-04-25 DIAGNOSIS — Z5111 Encounter for antineoplastic chemotherapy: Secondary | ICD-10-CM | POA: Diagnosis not present

## 2021-04-25 LAB — RAD ONC ARIA SESSION SUMMARY
Course Elapsed Days: 12
Plan Fractions Treated to Date: 9
Plan Prescribed Dose Per Fraction: 1.8 Gy
Plan Total Fractions Prescribed: 25
Plan Total Prescribed Dose: 45 Gy
Reference Point Dosage Given to Date: 16.2 Gy
Reference Point Session Dosage Given: 1.8 Gy
Session Number: 9

## 2021-04-25 MED ORDER — NYSTATIN 100000 UNIT/ML MT SUSP
Freq: Four times a day (QID) | OROMUCOSAL | 0 refills | Status: DC
  Filled 2021-04-25: qty 240, 3d supply, fill #0
  Filled 2021-04-25: qty 240, 12d supply, fill #0

## 2021-04-26 ENCOUNTER — Ambulatory Visit
Admission: RE | Admit: 2021-04-26 | Discharge: 2021-04-26 | Disposition: A | Discharge status: Home / Self Care | Payer: Managed Care, Other (non HMO) | Source: Ambulatory Visit | Attending: Radiation Oncology | Admitting: Radiation Oncology

## 2021-04-26 ENCOUNTER — Inpatient Hospital Stay: Payer: Managed Care, Other (non HMO)

## 2021-04-26 ENCOUNTER — Other Ambulatory Visit: Payer: Self-pay

## 2021-04-26 VITALS — BP 126/74 | HR 75 | Temp 98.3°F | Resp 18

## 2021-04-26 DIAGNOSIS — C539 Malignant neoplasm of cervix uteri, unspecified: Secondary | ICD-10-CM

## 2021-04-26 DIAGNOSIS — Z5111 Encounter for antineoplastic chemotherapy: Secondary | ICD-10-CM | POA: Diagnosis not present

## 2021-04-26 LAB — RAD ONC ARIA SESSION SUMMARY
Course Elapsed Days: 13
Plan Fractions Treated to Date: 10
Plan Prescribed Dose Per Fraction: 1.8 Gy
Plan Total Fractions Prescribed: 25
Plan Total Prescribed Dose: 45 Gy
Reference Point Dosage Given to Date: 18 Gy
Reference Point Session Dosage Given: 1.8 Gy
Session Number: 10

## 2021-04-26 MED ORDER — PALONOSETRON HCL INJECTION 0.25 MG/5ML
0.2500 mg | Freq: Once | INTRAVENOUS | Status: AC
  Administered 2021-04-26: 0.25 mg via INTRAVENOUS
  Filled 2021-04-26: qty 5

## 2021-04-26 MED ORDER — SODIUM CHLORIDE 0.9% FLUSH
10.0000 mL | INTRAVENOUS | Status: DC | PRN
  Administered 2021-04-26: 10 mL

## 2021-04-26 MED ORDER — POTASSIUM CHLORIDE IN NACL 20-0.9 MEQ/L-% IV SOLN
Freq: Once | INTRAVENOUS | Status: AC
  Filled 2021-04-26: qty 1000

## 2021-04-26 MED ORDER — MAGNESIUM SULFATE 2 GM/50ML IV SOLN
2.0000 g | Freq: Once | INTRAVENOUS | Status: AC
  Administered 2021-04-26: 2 g via INTRAVENOUS
  Filled 2021-04-26: qty 50

## 2021-04-26 MED ORDER — SODIUM CHLORIDE 0.9 % IV SOLN
150.0000 mg | Freq: Once | INTRAVENOUS | Status: AC
  Administered 2021-04-26: 150 mg via INTRAVENOUS
  Filled 2021-04-26: qty 150

## 2021-04-26 MED ORDER — SODIUM CHLORIDE 0.9 % IV SOLN
40.0000 mg/m2 | Freq: Once | INTRAVENOUS | Status: AC
  Administered 2021-04-26: 70 mg via INTRAVENOUS
  Filled 2021-04-26: qty 70

## 2021-04-26 MED ORDER — SODIUM CHLORIDE 0.9 % IV SOLN
10.0000 mg | Freq: Once | INTRAVENOUS | Status: AC
  Administered 2021-04-26: 10 mg via INTRAVENOUS
  Filled 2021-04-26: qty 10

## 2021-04-26 MED ORDER — HEPARIN SOD (PORK) LOCK FLUSH 100 UNIT/ML IV SOLN
500.0000 [IU] | Freq: Once | INTRAVENOUS | Status: AC | PRN
  Administered 2021-04-26: 500 [IU]

## 2021-04-26 MED ORDER — SODIUM CHLORIDE 0.9 % IV SOLN: Freq: Once | INTRAVENOUS | Status: AC

## 2021-04-26 NOTE — Patient Instructions (Signed)
[facility]  Discharge Instructions: Thank you for choosing [facility] to provide your oncology and hematology care.   If you have a lab appointment with the Cancer Center, please go directly to the Cancer Center and check in at the registration area.   Wear comfortable clothing and clothing appropriate for easy access to any Portacath or PICC line.   We strive to give you quality time with your provider. You may need to reschedule your appointment if you arrive late (15 or more minutes).  Arriving late affects you and other patients whose appointments are after yours.  Also, if you miss three or more appointments without notifying the office, you may be dismissed from the clinic at the provider's discretion.      For prescription refill requests, have your pharmacy contact our office and allow 72 hours for refills to be completed.    Today you received the following chemotherapy and/or immunotherapy agents : Cisplatin    To help prevent nausea and vomiting after your treatment, we encourage you to take your nausea medication as directed.  BELOW ARE SYMPTOMS THAT SHOULD BE REPORTED IMMEDIATELY: *FEVER GREATER THAN 100.4 F (38 C) OR HIGHER *CHILLS OR SWEATING *NAUSEA AND VOMITING THAT IS NOT CONTROLLED WITH YOUR NAUSEA MEDICATION *UNUSUAL SHORTNESS OF BREATH *UNUSUAL BRUISING OR BLEEDING *URINARY PROBLEMS (pain or burning when urinating, or frequent urination) *BOWEL PROBLEMS (unusual diarrhea, constipation, pain near the anus) TENDERNESS IN MOUTH AND THROAT WITH OR WITHOUT PRESENCE OF ULCERS (sore throat, sores in mouth, or a toothache) UNUSUAL RASH, SWELLING OR PAIN  UNUSUAL VAGINAL DISCHARGE OR ITCHING   Items with * indicate a potential emergency and should be followed up as soon as possible or go to the Emergency Department if any problems should occur.  Please show the CHEMOTHERAPY ALERT CARD or IMMUNOTHERAPY ALERT CARD at check-in to  the Emergency Department and triage nurse.  Should you have questions after your visit or need to cancel or reschedule your appointment, please contact [facility]  Dept: [phone]  and follow the prompts.  Office hours are 8:00 a.m. to 4:30 p.m. Monday - Friday. Please note that voicemails left after 4:00 p.m. may not be returned until the following business day.  We are closed weekends and major holidays. You have access to a nurse at all times for urgent questions. Please call the main number to the clinic Dept: [phone] and follow the prompts.   For any non-urgent questions, you may also contact your provider using MyChart. We now offer e-Visits for anyone 18 and older to request care online for non-urgent symptoms. For details visit [email or web address].   Also download the MyChart app! Go to the app store, search "MyChart", open the app, select [facility], and log in with your MyChart username and password.  Due to Covid, a mask is required upon entering the hospital/clinic. If you do not have a mask, one will be given to you upon arrival. For doctor visits, patients may have 1 support person aged 18 or older with them. For treatment visits, patients cannot have anyone with them due to current Covid guidelines and our immunocompromised population.   

## 2021-04-27 ENCOUNTER — Other Ambulatory Visit: Payer: Self-pay

## 2021-04-27 ENCOUNTER — Encounter: Payer: Self-pay | Admitting: Hematology and Oncology

## 2021-04-27 ENCOUNTER — Ambulatory Visit
Admission: RE | Admit: 2021-04-27 | Discharge: 2021-04-27 | Disposition: A | Discharge status: Home / Self Care | Payer: Managed Care, Other (non HMO) | Source: Ambulatory Visit | Attending: Radiation Oncology | Admitting: Radiation Oncology

## 2021-04-27 DIAGNOSIS — R197 Diarrhea, unspecified: Secondary | ICD-10-CM | POA: Insufficient documentation

## 2021-04-27 DIAGNOSIS — Z5111 Encounter for antineoplastic chemotherapy: Secondary | ICD-10-CM | POA: Diagnosis not present

## 2021-04-27 DIAGNOSIS — K1231 Oral mucositis (ulcerative) due to antineoplastic therapy: Secondary | ICD-10-CM | POA: Insufficient documentation

## 2021-04-27 LAB — RAD ONC ARIA SESSION SUMMARY
Course Elapsed Days: 14
Plan Fractions Treated to Date: 11
Plan Prescribed Dose Per Fraction: 1.8 Gy
Plan Total Fractions Prescribed: 25
Plan Total Prescribed Dose: 45 Gy
Reference Point Dosage Given to Date: 19.8 Gy
Reference Point Session Dosage Given: 1.8 Gy
Session Number: 11

## 2021-04-27 NOTE — Assessment & Plan Note (Signed)
There is no further signs of infection in her posterior oropharynx but she has symptoms of mucositis ?I recommend Magic mouthwash swish and swallow ?

## 2021-04-27 NOTE — Assessment & Plan Note (Signed)
She has mild intermittent diarrhea ?I recommend increased hydration as needed ?She can take Imodium as needed ?

## 2021-04-27 NOTE — Progress Notes (Signed)
[place] ?OFFICE PROGRESS NOTE ? ?Patient Care Team: ?Rosine Door as PCP - General (Physician Assistant) ?Janina Mayo, MD as PCP - Cardiology (Cardiology) ?Curlene Labrum, MD as Physician Assistant (Family Medicine) ?Awanda Mink Craige Cotta, RN as Oncology Nurse Navigator (Oncology) ? 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[place]) ?Overall, she tolerated treatment well except for diarrhea intermittently and mucositis ?We will proceed with treatment without delay ?I will continue to see her on a weekly basis for toxicity review ? ?Mucositis due to antineoplastic therapy ?There is no further signs of infection in her posterior oropharynx but she has symptoms of mucositis ?I recommend Magic mouthwash swish and swallow ? ?Diarrhea ?She has mild intermittent diarrhea ?I recommend increased hydration as needed ?She can take Imodium as needed ? ?No orders of the defined types were placed in this encounter. ? ? ?All questions were answered. The patient knows to call the clinic with any problems, questions or concerns. ?The total time spent in the appointment was 25 minutes encounter with patients including review of chart and various tests results, discussions about plan of care and coordination of care plan ?  ?Heath Lark, MD ?04/27/2021 7:51 AM ? ?INTERVAL HISTORY: ?Please see below for problem oriented charting. ?she returns for treatment follow-up prior to chemotherapy ?Her sore throat has improved just a little bit but she does have some discomfort especially when she is swallowing ?She has intermittent loose stool ?No peripheral neuropathy or hearing changes ? ?REVIEW OF SYSTEMS:   ?Constitutional: Denies fevers, chills or abnormal weight loss ?Eyes: Denies blurriness of vision ?Respiratory: Denies cough, dyspnea or wheezes ?Cardiovascular: Denies palpitation, chest discomfort or lower extremity swelling ?Skin: Denies abnormal skin rashes ?Lymphatics: Denies new lymphadenopathy or easy  bruising ?Neurological:Denies numbness, tingling or new weaknesses ?Behavioral/Psych: Mood is stable, no new changes  ?All other systems were reviewed with the patient and are negative. ? ?I have reviewed the past medical history, past surgical history, social history and family history with the patient and they are unchanged from previous note. ? ?ALLERGIES:  has No Known Allergies. ? ?MEDICATIONS:  ?Current Outpatient Medications  ?Medication Sig Dispense Refill  ? amoxicillin (AMOXIL) 500 MG tablet Take 1 tablet (500 mg total) by mouth 2 (two) times daily. 14 tablet 0  ? Ascorbic Acid (VITAMIN C) 1000 MG tablet Take 1,000 mg by mouth at bedtime.    ? Cyanocobalamin (B-12) 5000 MCG CAPS Take 5,000 mcg by mouth at bedtime.    ? DULoxetine (CYMBALTA) 60 MG capsule Take by mouth.    ? EYSUVIS 0.25 % SUSP Apply to eye.    ? lidocaine-diphenhydrAMINE-alum & mag hydroxide-simeth-nystatin Swish and swallow 5 MLs by mouth 4 times a day as directed 240 mL 0  ? lidocaine-prilocaine (EMLA) cream Apply to affected area once (Patient not taking: Reported on 04/04/2021) 30 g 3  ? Magnesium Oxide 250 MG TABS Take 1 tablet by mouth every morning.    ? Multiple Vitamins-Minerals (CENTRUM SILVER 50+WOMEN) TABS Take 1 tablet by mouth at bedtime.    ? Omega-3 1000 MG CAPS Take 1,000 mg by mouth at bedtime.    ? ondansetron (ZOFRAN) 8 MG tablet Take 1 tablet by mouth 2  times daily as needed. Start on the third day after cisplatin chemotherapy. (Patient not taking: Reported on 04/04/2021) 30 tablet 1  ? prochlorperazine (COMPAZINE) 10 MG tablet Take 1 tablet  by mouth every 6  hours as needed (Nausea or vomiting). (Patient not taking: Reported  on 04/04/2021) 30 tablet 1  ? Red Yeast Rice 600 MG TABS Take 1,200 mg by mouth at bedtime.    ? temazepam (RESTORIL) 30 MG capsule Take by mouth.    ? valACYclovir (VALTREX) 500 MG tablet Take 500 mg by mouth at bedtime.    ? ?No current facility-administered medications for this visit.   ? ? ?SUMMARY OF ONCOLOGIC HISTORY: ?Oncology History  ?Cervical adenocarcinoma ([place])  ?09/14/2020 Initial Diagnosis  ? The patient has a history of prior abnormal paps, did not require biopsies are procedures, normalized. Has previously been HPV+.  More recent history is as follows: ?Pap 08/2020: ASC-H, HR HPV positive ?Colposcopy 09/14/20: ECC negative, CIN3 on cervical biopsies ?  ?02/15/2021 Pathology Results  ? [id number] ? ?Cone biopsy of cervix showed invasive adenocarcinoma, 2.1 cm in maximum dimension, 1.5 cm depth of invasion.  Endocervical and deep margins are involved.  High-grade squamous intraepithelial lesion with involvement of endocervical glands were present.  Endocervix curettage show detached fragment of adenocarcinoma. ?P16 is strongly positive. ?  ?03/09/2021 Initial Diagnosis  ? Cervical adenocarcinoma ([place]) ? ?  ?03/24/2021 PET scan  ? 1. Moderate activity in the cervix with maximum SUV of 5.6. Some of this could be postprocedural/inflammatory related to recent conization, but residual tumor is not excluded. No findings of ?distant metastatic spread or hypermetabolic adenopathy. ?  ?03/30/2021 Imaging  ? MRI pelvis ?Repeat imaging was performed with intersection again showing the tumor biased towards the anterior aspect of the cervix but tracking towards the RIGHT posterolateral cervix with stromal disruption at the level of the inferior posterolateral cervix on the RIGHT (image 19/40) ?  ?Contrasted imaging (image 20/44) this shows contrast enhancement which follows this pattern. Early extension into the RIGHT posterolateral parametrium at the lower margin of the cervix is demonstrated on both contrasted and precontrast T2 weighted imaging. The lesion involves RIGHT anterolateral 2/3 of the cervix with loss of endocervical distinction noted on image 19 of 40 across the entire interface between the mass and the cervix, cervical ?disruption of stroma noted RIGHT posterolateral aspect as described.    ?Ovaries are normal. ?  ?Final impression: Cervical neoplasm with extension into cervical stroma and disruption of cervical stroma most notably along the RIGHT posterolateral margin with early extension into the RIGHT parametrium. ?  ?  ?03/31/2021 Cancer Staging  ? Staging form: Cervix Uteri, AJCC Version 9 ?- Clinical stage from 03/31/2021: Stage IIB (cT2b, cN0, cM0) - Signed by Heath Lark, MD on 03/31/2021 ?Stage prefix: Initial diagnosis ? ?  ?04/12/2021 -  Chemotherapy  ? Patient is on Treatment Plan : Cervical Cisplatin q7d  ? ?  ?  ?04/12/2021 Procedure  ? Placement of single lumen port a cath via right internal jugular vein. The catheter tip lies at the cavo-atrial junction. A power injectable port a cath was placed and is ready for immediate use. ?  ? ? ?PHYSICAL EXAMINATION: ?ECOG PERFORMANCE STATUS: 1 - Symptomatic but completely ambulatory ? ?Vitals:  ? 04/25/21 1509  ?BP: 134/85  ?Pulse: 85  ?Resp: 18  ?Temp: 98.1 ?F (36.7 ?C)  ?SpO2: 100%  ? ?Filed Weights  ? 04/25/21 1509  ?Weight: 144 lb 6.4 oz (65.5 kg)  ? ? ?GENERAL:alert, no distress and comfortable ?SKIN: skin color, texture, turgor are normal, no rashes or significant lesions ?EYES: normal, Conjunctiva are pink and non-injected, sclera clear ?OROPHARYNX:no exudate, no erythema and lips, buccal mucosa, and tongue normal  ?NECK: supple, thyroid normal size, non-tender, without nodularity ?LYMPH:  no palpable lymphadenopathy in the cervical, axillary or inguinal ?LUNGS: clear to auscultation and percussion with normal breathing effort ?HEART: regular rate & rhythm and no murmurs and no lower extremity edema ?ABDOMEN:abdomen soft, non-tender and normal bowel sounds ?Musculoskeletal:no cyanosis of digits and no clubbing  ?NEURO: alert & oriented x 3 with fluent speech, no focal motor/sensory deficits ? ?LABORATORY DATA:  ?I have reviewed the data as listed ?   ?Component Value Date/Time  ? NA 138 04/24/2021 1531  ? K 3.7 04/24/2021 1531  ? CL 105  04/24/2021 1531  ? CO2 28 04/24/2021 1531  ? GLUCOSE 108 (H) 04/24/2021 1531  ? BUN 12 04/24/2021 1531  ? CREATININE 0.63 04/24/2021 1531  ? CALCIUM 8.7 (L) 04/24/2021 1531  ? GFRNONAA >60 04/24/2021 1531  ? ? ?No results

## 2021-04-27 NOTE — Assessment & Plan Note (Signed)
Overall, she tolerated treatment well except for diarrhea intermittently and mucositis ?We will proceed with treatment without delay ?I will continue to see her on a weekly basis for toxicity review ?

## 2021-04-28 ENCOUNTER — Ambulatory Visit
Admission: RE | Admit: 2021-04-28 | Discharge: 2021-04-28 | Disposition: A | Discharge status: Home / Self Care | Payer: Managed Care, Other (non HMO) | Source: Ambulatory Visit | Attending: Radiation Oncology | Admitting: Radiation Oncology

## 2021-04-28 ENCOUNTER — Other Ambulatory Visit: Payer: Self-pay

## 2021-04-28 DIAGNOSIS — Z5111 Encounter for antineoplastic chemotherapy: Secondary | ICD-10-CM | POA: Diagnosis not present

## 2021-04-28 LAB — RAD ONC ARIA SESSION SUMMARY
Course Elapsed Days: 15
Plan Fractions Treated to Date: 12
Plan Prescribed Dose Per Fraction: 1.8 Gy
Plan Total Fractions Prescribed: 25
Plan Total Prescribed Dose: 45 Gy
Reference Point Dosage Given to Date: 21.6 Gy
Reference Point Session Dosage Given: 1.8 Gy
Session Number: 12

## 2021-05-01 ENCOUNTER — Other Ambulatory Visit: Payer: Self-pay

## 2021-05-01 ENCOUNTER — Inpatient Hospital Stay: Payer: Managed Care, Other (non HMO)

## 2021-05-01 ENCOUNTER — Ambulatory Visit
Admission: RE | Admit: 2021-05-01 | Discharge: 2021-05-01 | Disposition: A | Discharge status: Home / Self Care | Payer: Managed Care, Other (non HMO) | Source: Ambulatory Visit | Attending: Radiation Oncology | Admitting: Radiation Oncology

## 2021-05-01 DIAGNOSIS — Z5111 Encounter for antineoplastic chemotherapy: Secondary | ICD-10-CM | POA: Diagnosis not present

## 2021-05-01 DIAGNOSIS — C539 Malignant neoplasm of cervix uteri, unspecified: Secondary | ICD-10-CM

## 2021-05-01 LAB — CBC WITH DIFFERENTIAL (CANCER CENTER ONLY)
Abs Immature Granulocytes: 0.01 10*3/uL (ref 0.00–0.07)
Basophils Absolute: 0 10*3/uL (ref 0.0–0.1)
Basophils Relative: 0 %
Eosinophils Absolute: 0.1 10*3/uL (ref 0.0–0.5)
Eosinophils Relative: 2 %
HCT: 36.9 % (ref 36.0–46.0)
Hemoglobin: 11.9 g/dL — ABNORMAL LOW (ref 12.0–15.0)
Immature Granulocytes: 0 %
Lymphocytes Relative: 34 %
Lymphs Abs: 1.2 10*3/uL (ref 0.7–4.0)
MCH: 26.5 pg (ref 26.0–34.0)
MCHC: 32.2 g/dL (ref 30.0–36.0)
MCV: 82.2 fL (ref 80.0–100.0)
Monocytes Absolute: 0.4 10*3/uL (ref 0.1–1.0)
Monocytes Relative: 10 %
Neutro Abs: 2 10*3/uL (ref 1.7–7.7)
Neutrophils Relative %: 54 %
Platelet Count: 168 10*3/uL (ref 150–400)
RBC: 4.49 MIL/uL (ref 3.87–5.11)
RDW: 12.8 % (ref 11.5–15.5)
WBC Count: 3.7 10*3/uL — ABNORMAL LOW (ref 4.0–10.5)
nRBC: 0 % (ref 0.0–0.2)

## 2021-05-01 LAB — RAD ONC ARIA SESSION SUMMARY
Course Elapsed Days: 18
Plan Fractions Treated to Date: 13
Plan Prescribed Dose Per Fraction: 1.8 Gy
Plan Total Fractions Prescribed: 25
Plan Total Prescribed Dose: 45 Gy
Reference Point Dosage Given to Date: 23.4 Gy
Reference Point Session Dosage Given: 1.8 Gy
Session Number: 13

## 2021-05-01 LAB — BASIC METABOLIC PANEL - CANCER CENTER ONLY
Anion gap: 8 (ref 5–15)
BUN: 12 mg/dL (ref 6–20)
CO2: 28 mmol/L (ref 22–32)
Calcium: 8.8 mg/dL — ABNORMAL LOW (ref 8.9–10.3)
Chloride: 101 mmol/L (ref 98–111)
Creatinine: 0.85 mg/dL (ref 0.44–1.00)
GFR, Estimated: >60 mL/min (ref >60)
Glucose, Bld: 105 mg/dL — ABNORMAL HIGH (ref 70–99)
Potassium: 3.5 mmol/L (ref 3.5–5.1)
Sodium: 137 mmol/L (ref 135–145)

## 2021-05-01 LAB — MAGNESIUM: Magnesium: 2.1 mg/dL (ref 1.7–2.4)

## 2021-05-01 MED ORDER — SODIUM CHLORIDE 0.9% FLUSH
10.0000 mL | Freq: Once | INTRAVENOUS | Status: AC
  Administered 2021-05-01: 10 mL

## 2021-05-01 MED ORDER — HEPARIN SOD (PORK) LOCK FLUSH 100 UNIT/ML IV SOLN
500.0000 [IU] | Freq: Once | INTRAVENOUS | Status: AC
  Administered 2021-05-01: 500 [IU]

## 2021-05-02 ENCOUNTER — Ambulatory Visit
Admission: RE | Admit: 2021-05-02 | Discharge: 2021-05-02 | Disposition: A | Discharge status: Home / Self Care | Payer: Managed Care, Other (non HMO) | Source: Ambulatory Visit | Attending: Radiation Oncology | Admitting: Radiation Oncology

## 2021-05-02 ENCOUNTER — Other Ambulatory Visit: Payer: Self-pay

## 2021-05-02 ENCOUNTER — Inpatient Hospital Stay (HOSPITAL_BASED_OUTPATIENT_CLINIC_OR_DEPARTMENT_OTHER): Payer: Managed Care, Other (non HMO) | Admitting: Hematology and Oncology

## 2021-05-02 DIAGNOSIS — C539 Malignant neoplasm of cervix uteri, unspecified: Secondary | ICD-10-CM | POA: Diagnosis not present

## 2021-05-02 DIAGNOSIS — D61818 Other pancytopenia: Secondary | ICD-10-CM

## 2021-05-02 DIAGNOSIS — K1231 Oral mucositis (ulcerative) due to antineoplastic therapy: Secondary | ICD-10-CM | POA: Diagnosis not present

## 2021-05-02 DIAGNOSIS — Z5111 Encounter for antineoplastic chemotherapy: Secondary | ICD-10-CM | POA: Diagnosis not present

## 2021-05-02 DIAGNOSIS — H938X3 Other specified disorders of ear, bilateral: Secondary | ICD-10-CM | POA: Diagnosis not present

## 2021-05-02 LAB — RAD ONC ARIA SESSION SUMMARY
Course Elapsed Days: 19
Plan Fractions Treated to Date: 14
Plan Prescribed Dose Per Fraction: 1.8 Gy
Plan Total Fractions Prescribed: 25
Plan Total Prescribed Dose: 45 Gy
Reference Point Dosage Given to Date: 25.2 Gy
Reference Point Session Dosage Given: 1.8 Gy
Session Number: 14

## 2021-05-03 ENCOUNTER — Other Ambulatory Visit: Payer: Self-pay

## 2021-05-03 ENCOUNTER — Encounter: Payer: Self-pay | Admitting: Hematology and Oncology

## 2021-05-03 ENCOUNTER — Ambulatory Visit
Admission: RE | Admit: 2021-05-03 | Discharge: 2021-05-03 | Disposition: A | Discharge status: Home / Self Care | Payer: Managed Care, Other (non HMO) | Source: Ambulatory Visit | Attending: Radiation Oncology | Admitting: Radiation Oncology

## 2021-05-03 ENCOUNTER — Inpatient Hospital Stay: Payer: Managed Care, Other (non HMO)

## 2021-05-03 VITALS — BP 133/87 | HR 87 | Temp 97.7°F | Resp 18

## 2021-05-03 DIAGNOSIS — D61818 Other pancytopenia: Secondary | ICD-10-CM | POA: Insufficient documentation

## 2021-05-03 DIAGNOSIS — H938X9 Other specified disorders of ear, unspecified ear: Secondary | ICD-10-CM | POA: Insufficient documentation

## 2021-05-03 DIAGNOSIS — Z5111 Encounter for antineoplastic chemotherapy: Secondary | ICD-10-CM | POA: Diagnosis not present

## 2021-05-03 DIAGNOSIS — C539 Malignant neoplasm of cervix uteri, unspecified: Secondary | ICD-10-CM

## 2021-05-03 LAB — RAD ONC ARIA SESSION SUMMARY
Course Elapsed Days: 20
Plan Fractions Treated to Date: 15
Plan Prescribed Dose Per Fraction: 1.8 Gy
Plan Total Fractions Prescribed: 25
Plan Total Prescribed Dose: 45 Gy
Reference Point Dosage Given to Date: 27 Gy
Reference Point Session Dosage Given: 1.8 Gy
Session Number: 15

## 2021-05-03 MED ORDER — SODIUM CHLORIDE 0.9 % IV SOLN
32.0000 mg/m2 | Freq: Once | INTRAVENOUS | Status: AC
  Administered 2021-05-03: 56 mg via INTRAVENOUS
  Filled 2021-05-03: qty 56

## 2021-05-03 MED ORDER — SODIUM CHLORIDE 0.9% FLUSH
10.0000 mL | INTRAVENOUS | Status: DC | PRN
  Administered 2021-05-03: 10 mL

## 2021-05-03 MED ORDER — PALONOSETRON HCL INJECTION 0.25 MG/5ML
0.2500 mg | Freq: Once | INTRAVENOUS | Status: AC
  Administered 2021-05-03: 0.25 mg via INTRAVENOUS
  Filled 2021-05-03: qty 5

## 2021-05-03 MED ORDER — HEPARIN SOD (PORK) LOCK FLUSH 100 UNIT/ML IV SOLN
500.0000 [IU] | Freq: Once | INTRAVENOUS | Status: AC | PRN
  Administered 2021-05-03: 500 [IU]

## 2021-05-03 MED ORDER — POTASSIUM CHLORIDE IN NACL 20-0.9 MEQ/L-% IV SOLN
Freq: Once | INTRAVENOUS | Status: AC
  Filled 2021-05-03: qty 1000

## 2021-05-03 MED ORDER — SODIUM CHLORIDE 0.9 % IV SOLN
10.0000 mg | Freq: Once | INTRAVENOUS | Status: AC
  Administered 2021-05-03: 10 mg via INTRAVENOUS
  Filled 2021-05-03: qty 10

## 2021-05-03 MED ORDER — SODIUM CHLORIDE 0.9 % IV SOLN: Freq: Once | INTRAVENOUS | Status: AC

## 2021-05-03 MED ORDER — SODIUM CHLORIDE 0.9 % IV SOLN
150.0000 mg | Freq: Once | INTRAVENOUS | Status: AC
  Administered 2021-05-03: 150 mg via INTRAVENOUS
  Filled 2021-05-03: qty 150

## 2021-05-03 MED ORDER — MAGNESIUM SULFATE 2 GM/50ML IV SOLN
2.0000 g | Freq: Once | INTRAVENOUS | Status: AC
  Administered 2021-05-03: 2 g via INTRAVENOUS
  Filled 2021-05-03: qty 50

## 2021-05-03 NOTE — Progress Notes (Signed)
[place] ?OFFICE PROGRESS NOTE ? ?Patient Care Team: ?Rosine Door as PCP - General (Physician Assistant) ?Janina Mayo, MD as PCP - Cardiology (Cardiology) ?Curlene Labrum, MD as Physician Assistant (Family Medicine) ?Awanda Mink Craige Cotta, RN as Oncology Nurse Navigator (Oncology) ? 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[place]) ?Overall, she has developed ototoxicity in addition to diarrhea, mucositis and pancytopenia ?We will proceed with treatment with slight dose reduction ?I will continue to see her on a weekly basis for toxicity review ? ?Ototoxicity ?she has mild ototoxicity, likely related to side effects of treatment. ?I plan to reduce the dose of treatment as outlined above.  ?I explained to the patient the rationale of this strategy and reassured the patient it would not compromise the efficacy of treatment ? ?Acquired pancytopenia ([place]) ?This is due to treatment ?Observe for now ? ?Mucositis due to antineoplastic therapy ?There is no further signs of infection in her posterior oropharynx but she has symptoms of mucositis ?I recommend Magic mouthwash swish and swallow ?I am hoping the minor dose reduction will reduce her symptoms ? ?No orders of the defined types were placed in this encounter. ? ? ?All questions were answered. The patient knows to call the clinic with any problems, questions or concerns. ?The total time spent in the appointment was 30 minutes encounter with patients including review of chart and various tests results, discussions about plan of care and coordination of care plan ?  ?Heath Lark, MD ?05/03/2021 2:01 PM ? ?INTERVAL HISTORY: ?Please see below for problem oriented charting. ?she returns for treatment follow-up prior to chemotherapy ?Her sore throat has improved just a little bit but she does have some discomfort especially when she is swallowing and persistent mucositis ?She has intermittent loose stool ?No peripheral neuropathy but she has  noticed tinnitus ?REVIEW OF SYSTEMS:   ?Constitutional: Denies fevers, chills or abnormal weight loss ?Eyes: Denies blurriness of vision ?Respiratory: Denies cough, dyspnea or wheezes ?Cardiovascular: Denies palpitation, chest discomfort or lower extremity swelling ?Skin: Denies abnormal skin rashes ?Lymphatics: Denies new lymphadenopathy or easy bruising ?Behavioral/Psych: Mood is stable, no new changes  ?All other systems were reviewed with the patient and are negative. ? ?I have reviewed the past medical history, past surgical history, social history and family history with the patient and they are unchanged from previous note. ? ?ALLERGIES:  has No Known Allergies. ? ?MEDICATIONS:  ?Current Outpatient Medications  ?Medication Sig Dispense Refill  ? Ascorbic Acid (VITAMIN C) 1000 MG tablet Take 1,000 mg by mouth at bedtime.    ? Cyanocobalamin (B-12) 5000 MCG CAPS Take 5,000 mcg by mouth at bedtime.    ? DULoxetine (CYMBALTA) 60 MG capsule Take by mouth.    ? EYSUVIS 0.25 % SUSP Apply to eye.    ? lidocaine-diphenhydrAMINE-alum & mag hydroxide-simeth-nystatin Swish and swallow 5 MLs by mouth 4 times a day as directed 240 mL 0  ? lidocaine-prilocaine (EMLA) cream Apply to affected area once (Patient not taking: Reported on 04/04/2021) 30 g 3  ? Magnesium Oxide 250 MG TABS Take 1 tablet by mouth every morning.    ? Multiple Vitamins-Minerals (CENTRUM SILVER 50+WOMEN) TABS Take 1 tablet by mouth at bedtime.    ? Omega-3 1000 MG CAPS Take 1,000 mg by mouth at bedtime.    ? ondansetron (ZOFRAN) 8 MG tablet Take 1 tablet by mouth 2  times daily as needed. Start on the third day after cisplatin chemotherapy. (Patient not taking: Reported on  04/04/2021) 30 tablet 1  ? prochlorperazine (COMPAZINE) 10 MG tablet Take 1 tablet  by mouth every 6  hours as needed (Nausea or vomiting). (Patient not taking: Reported on 04/04/2021) 30 tablet 1  ? Red Yeast Rice 600 MG TABS Take 1,200 mg by mouth at bedtime.    ? temazepam (RESTORIL)  30 MG capsule Take by mouth.    ? valACYclovir (VALTREX) 500 MG tablet Take 500 mg by mouth at bedtime.    ? ?No current facility-administered medications for this visit.  ? ?Facility-Administered Medications Ordered in Other Visits  ?Medication Dose Route Frequency Provider Last Rate Last Admin  ? heparin lock flush 100 unit/mL  500 Units Intracatheter Once PRN Alvy Bimler, Waylan Busta, MD      ? sodium chloride flush (NS) 0.9 % injection 10 mL  10 mL Intracatheter PRN Heath Lark, MD      ? ? ?SUMMARY OF ONCOLOGIC HISTORY: ?Oncology History  ?Cervical adenocarcinoma ([place])  ?09/14/2020 Initial Diagnosis  ? The patient has a history of prior abnormal paps, did not require biopsies are procedures, normalized. Has previously been HPV+.  More recent history is as follows: ?Pap 08/2020: ASC-H, HR HPV positive ?Colposcopy 09/14/20: ECC negative, CIN3 on cervical biopsies ?  ?02/15/2021 Pathology Results  ? [id number] ? ?Cone biopsy of cervix showed invasive adenocarcinoma, 2.1 cm in maximum dimension, 1.5 cm depth of invasion.  Endocervical and deep margins are involved.  High-grade squamous intraepithelial lesion with involvement of endocervical glands were present.  Endocervix curettage show detached fragment of adenocarcinoma. ?P16 is strongly positive. ?  ?03/09/2021 Initial Diagnosis  ? Cervical adenocarcinoma ([place]) ? ?  ?03/24/2021 PET scan  ? 1. Moderate activity in the cervix with maximum SUV of 5.6. Some of this could be postprocedural/inflammatory related to recent conization, but residual tumor is not excluded. No findings of ?distant metastatic spread or hypermetabolic adenopathy. ?  ?03/30/2021 Imaging  ? MRI pelvis ?Repeat imaging was performed with intersection again showing the tumor biased towards the anterior aspect of the cervix but tracking towards the RIGHT posterolateral cervix with stromal disruption at the level of the inferior posterolateral cervix on the RIGHT (image 19/40) ?  ?Contrasted imaging (image 20/44) this  shows contrast enhancement which follows this pattern. Early extension into the RIGHT posterolateral parametrium at the lower margin of the cervix is demonstrated on both contrasted and precontrast T2 weighted imaging. The lesion involves RIGHT anterolateral 2/3 of the cervix with loss of endocervical distinction noted on image 19 of 40 across the entire interface between the mass and the cervix, cervical ?disruption of stroma noted RIGHT posterolateral aspect as described.   ?Ovaries are normal. ?  ?Final impression: Cervical neoplasm with extension into cervical stroma and disruption of cervical stroma most notably along the RIGHT posterolateral margin with early extension into the RIGHT parametrium. ?  ?  ?03/31/2021 Cancer Staging  ? Staging form: Cervix Uteri, AJCC Version 9 ?- Clinical stage from 03/31/2021: Stage IIB (cT2b, cN0, cM0) - Signed by Heath Lark, MD on 03/31/2021 ?Stage prefix: Initial diagnosis ? ?  ?04/12/2021 -  Chemotherapy  ? Patient is on Treatment Plan : Cervical Cisplatin q7d  ? ?  ?  ?04/12/2021 Procedure  ? Placement of single lumen port a cath via right internal jugular vein. The catheter tip lies at the cavo-atrial junction. A power injectable port a cath was placed and is ready for immediate use. ?  ? ? ?PHYSICAL EXAMINATION: ?ECOG PERFORMANCE STATUS: 1 - Symptomatic but completely ambulatory ? ?  Vitals:  ? 05/02/21 1500  ?BP: 120/81  ?Pulse: 76  ?Resp: 18  ?Temp: (!) 96.7 ?F (35.9 ?C)  ?SpO2: 100%  ? ?Filed Weights  ? 05/02/21 1500  ?Weight: 144 lb (65.3 kg)  ? ? ?GENERAL:alert, no distress and comfortable ? ?NEURO: alert & oriented x 3 with fluent speech, no focal motor/sensory deficits ? ?LABORATORY DATA:  ?I have reviewed the data as listed ?   ?Component Value Date/Time  ? NA 137 05/01/2021 1521  ? K 3.5 05/01/2021 1521  ? CL 101 05/01/2021 1521  ? CO2 28 05/01/2021 1521  ? GLUCOSE 105 (H) 05/01/2021 1521  ? BUN 12 05/01/2021 1521  ? CREATININE 0.85 05/01/2021 1521  ? CALCIUM 8.8 (L)  05/01/2021 1521  ? GFRNONAA >60 05/01/2021 1521  ? ? ?No results found for: SPEP, UPEP ? ?Lab Results  ?Component Value Date  ? WBC 3.7 (L) 05/01/2021  ? NEUTROABS 2.0 05/01/2021  ? HGB 11.9 (L) 05/01/2021

## 2021-05-03 NOTE — Assessment & Plan Note (Signed)
This is due to treatment ?Observe for now ?

## 2021-05-03 NOTE — Assessment & Plan Note (Signed)
Overall, she has developed ototoxicity in addition to diarrhea, mucositis and pancytopenia ?We will proceed with treatment with slight dose reduction ?I will continue to see her on a weekly basis for toxicity review ?

## 2021-05-03 NOTE — Assessment & Plan Note (Signed)
she has mild ototoxicity, likely related to side effects of treatment. ?I plan to reduce the dose of treatment as outlined above.  ?I explained to the patient the rationale of this strategy and reassured the patient it would not compromise the efficacy of treatment ?

## 2021-05-03 NOTE — Assessment & Plan Note (Signed)
There is no further signs of infection in her posterior oropharynx but she has symptoms of mucositis ?I recommend Magic mouthwash swish and swallow ?I am hoping the minor dose reduction will reduce her symptoms ?

## 2021-05-03 NOTE — Patient Instructions (Signed)
[facility]  Discharge Instructions: Thank you for choosing [facility] to provide your oncology and hematology care.   If you have a lab appointment with the Cancer Center, please go directly to the Cancer Center and check in at the registration area.   Wear comfortable clothing and clothing appropriate for easy access to any Portacath or PICC line.   We strive to give you quality time with your provider. You may need to reschedule your appointment if you arrive late (15 or more minutes).  Arriving late affects you and other patients whose appointments are after yours.  Also, if you miss three or more appointments without notifying the office, you may be dismissed from the clinic at the provider's discretion.      For prescription refill requests, have your pharmacy contact our office and allow 72 hours for refills to be completed.    Today you received the following chemotherapy and/or immunotherapy agents : Cisplatin    To help prevent nausea and vomiting after your treatment, we encourage you to take your nausea medication as directed.  BELOW ARE SYMPTOMS THAT SHOULD BE REPORTED IMMEDIATELY: *FEVER GREATER THAN 100.4 F (38 C) OR HIGHER *CHILLS OR SWEATING *NAUSEA AND VOMITING THAT IS NOT CONTROLLED WITH YOUR NAUSEA MEDICATION *UNUSUAL SHORTNESS OF BREATH *UNUSUAL BRUISING OR BLEEDING *URINARY PROBLEMS (pain or burning when urinating, or frequent urination) *BOWEL PROBLEMS (unusual diarrhea, constipation, pain near the anus) TENDERNESS IN MOUTH AND THROAT WITH OR WITHOUT PRESENCE OF ULCERS (sore throat, sores in mouth, or a toothache) UNUSUAL RASH, SWELLING OR PAIN  UNUSUAL VAGINAL DISCHARGE OR ITCHING   Items with * indicate a potential emergency and should be followed up as soon as possible or go to the Emergency Department if any problems should occur.  Please show the CHEMOTHERAPY ALERT CARD or IMMUNOTHERAPY ALERT CARD at check-in to  the Emergency Department and triage nurse.  Should you have questions after your visit or need to cancel or reschedule your appointment, please contact [facility]  Dept: [phone]  and follow the prompts.  Office hours are 8:00 a.m. to 4:30 p.m. Monday - Friday. Please note that voicemails left after 4:00 p.m. may not be returned until the following business day.  We are closed weekends and major holidays. You have access to a nurse at all times for urgent questions. Please call the main number to the clinic Dept: [phone] and follow the prompts.   For any non-urgent questions, you may also contact your provider using MyChart. We now offer e-Visits for anyone 18 and older to request care online for non-urgent symptoms. For details visit [email or web address].   Also download the MyChart app! Go to the app store, search "MyChart", open the app, select [facility], and log in with your MyChart username and password.  Due to Covid, a mask is required upon entering the hospital/clinic. If you do not have a mask, one will be given to you upon arrival. For doctor visits, patients may have 1 support person aged 18 or older with them. For treatment visits, patients cannot have anyone with them due to current Covid guidelines and our immunocompromised population.   

## 2021-05-04 ENCOUNTER — Inpatient Hospital Stay (HOSPITAL_BASED_OUTPATIENT_CLINIC_OR_DEPARTMENT_OTHER): Payer: Managed Care, Other (non HMO) | Admitting: Genetic Counselor

## 2021-05-04 ENCOUNTER — Other Ambulatory Visit: Payer: Self-pay

## 2021-05-04 ENCOUNTER — Other Ambulatory Visit: Payer: Self-pay | Admitting: Genetic Counselor

## 2021-05-04 ENCOUNTER — Ambulatory Visit
Admission: RE | Admit: 2021-05-04 | Discharge: 2021-05-04 | Disposition: A | Discharge status: Home / Self Care | Payer: Managed Care, Other (non HMO) | Source: Ambulatory Visit | Attending: Radiation Oncology | Admitting: Radiation Oncology

## 2021-05-04 DIAGNOSIS — Z5111 Encounter for antineoplastic chemotherapy: Secondary | ICD-10-CM | POA: Diagnosis not present

## 2021-05-04 DIAGNOSIS — Z8049 Family history of malignant neoplasm of other genital organs: Secondary | ICD-10-CM

## 2021-05-04 DIAGNOSIS — Z800 Family history of malignant neoplasm of digestive organs: Secondary | ICD-10-CM

## 2021-05-04 DIAGNOSIS — Z803 Family history of malignant neoplasm of breast: Secondary | ICD-10-CM | POA: Diagnosis not present

## 2021-05-04 DIAGNOSIS — C539 Malignant neoplasm of cervix uteri, unspecified: Secondary | ICD-10-CM

## 2021-05-04 LAB — RAD ONC ARIA SESSION SUMMARY
Course Elapsed Days: 21
Plan Fractions Treated to Date: 16
Plan Prescribed Dose Per Fraction: 1.8 Gy
Plan Total Fractions Prescribed: 25
Plan Total Prescribed Dose: 45 Gy
Reference Point Dosage Given to Date: 28.8 Gy
Reference Point Session Dosage Given: 1.8 Gy
Session Number: 16

## 2021-05-05 ENCOUNTER — Ambulatory Visit
Admission: RE | Admit: 2021-05-05 | Discharge: 2021-05-05 | Disposition: A | Discharge status: Home / Self Care | Payer: Managed Care, Other (non HMO) | Source: Ambulatory Visit | Attending: Radiation Oncology | Admitting: Radiation Oncology

## 2021-05-05 ENCOUNTER — Other Ambulatory Visit: Payer: Self-pay

## 2021-05-05 DIAGNOSIS — Z5111 Encounter for antineoplastic chemotherapy: Secondary | ICD-10-CM | POA: Diagnosis not present

## 2021-05-05 LAB — RAD ONC ARIA SESSION SUMMARY
Course Elapsed Days: 22
Plan Fractions Treated to Date: 17
Plan Prescribed Dose Per Fraction: 1.8 Gy
Plan Total Fractions Prescribed: 25
Plan Total Prescribed Dose: 45 Gy
Reference Point Dosage Given to Date: 30.6 Gy
Reference Point Session Dosage Given: 1.8 Gy
Session Number: 17

## 2021-05-08 ENCOUNTER — Other Ambulatory Visit: Payer: Self-pay

## 2021-05-08 ENCOUNTER — Other Ambulatory Visit (HOSPITAL_COMMUNITY): Payer: Self-pay

## 2021-05-08 ENCOUNTER — Ambulatory Visit
Admission: RE | Admit: 2021-05-08 | Discharge: 2021-05-08 | Disposition: A | Discharge status: Home / Self Care | Payer: Managed Care, Other (non HMO) | Source: Ambulatory Visit | Attending: Radiation Oncology | Admitting: Radiation Oncology

## 2021-05-08 ENCOUNTER — Encounter: Payer: Self-pay | Admitting: Genetic Counselor

## 2021-05-08 ENCOUNTER — Inpatient Hospital Stay: Payer: Managed Care, Other (non HMO)

## 2021-05-08 DIAGNOSIS — Z5111 Encounter for antineoplastic chemotherapy: Secondary | ICD-10-CM | POA: Insufficient documentation

## 2021-05-08 DIAGNOSIS — C539 Malignant neoplasm of cervix uteri, unspecified: Secondary | ICD-10-CM | POA: Insufficient documentation

## 2021-05-08 DIAGNOSIS — Z79899 Other long term (current) drug therapy: Secondary | ICD-10-CM | POA: Insufficient documentation

## 2021-05-08 DIAGNOSIS — D61818 Other pancytopenia: Secondary | ICD-10-CM | POA: Insufficient documentation

## 2021-05-08 DIAGNOSIS — Z800 Family history of malignant neoplasm of digestive organs: Secondary | ICD-10-CM | POA: Insufficient documentation

## 2021-05-08 DIAGNOSIS — Z8049 Family history of malignant neoplasm of other genital organs: Secondary | ICD-10-CM

## 2021-05-08 DIAGNOSIS — R110 Nausea: Secondary | ICD-10-CM | POA: Insufficient documentation

## 2021-05-08 DIAGNOSIS — Z803 Family history of malignant neoplasm of breast: Secondary | ICD-10-CM

## 2021-05-08 HISTORY — DX: Family history of malignant neoplasm of digestive organs: Z80.0

## 2021-05-08 HISTORY — DX: Family history of malignant neoplasm of other genital organs: Z80.49

## 2021-05-08 HISTORY — DX: Family history of malignant neoplasm of breast: Z80.3

## 2021-05-08 LAB — RAD ONC ARIA SESSION SUMMARY
Course Elapsed Days: 25
Plan Fractions Treated to Date: 18
Plan Prescribed Dose Per Fraction: 1.8 Gy
Plan Total Fractions Prescribed: 25
Plan Total Prescribed Dose: 45 Gy
Reference Point Dosage Given to Date: 32.4 Gy
Reference Point Session Dosage Given: 1.8 Gy
Session Number: 18

## 2021-05-08 LAB — BASIC METABOLIC PANEL - CANCER CENTER ONLY
Anion gap: 5 (ref 5–15)
BUN: 14 mg/dL (ref 6–20)
CO2: 29 mmol/L (ref 22–32)
Calcium: 8.6 mg/dL — ABNORMAL LOW (ref 8.9–10.3)
Chloride: 102 mmol/L (ref 98–111)
Creatinine: 0.8 mg/dL (ref 0.44–1.00)
GFR, Estimated: >60 mL/min (ref >60)
Glucose, Bld: 100 mg/dL — ABNORMAL HIGH (ref 70–99)
Potassium: 3.8 mmol/L (ref 3.5–5.1)
Sodium: 136 mmol/L (ref 135–145)

## 2021-05-08 LAB — CBC WITH DIFFERENTIAL (CANCER CENTER ONLY)
Abs Immature Granulocytes: 0 10*3/uL (ref 0.00–0.07)
Basophils Absolute: 0 10*3/uL (ref 0.0–0.1)
Basophils Relative: 0 %
Eosinophils Absolute: 0 10*3/uL (ref 0.0–0.5)
Eosinophils Relative: 2 %
HCT: 34.4 % — ABNORMAL LOW (ref 36.0–46.0)
Hemoglobin: 11.2 g/dL — ABNORMAL LOW (ref 12.0–15.0)
Immature Granulocytes: 0 %
Lymphocytes Relative: 32 %
Lymphs Abs: 0.8 10*3/uL (ref 0.7–4.0)
MCH: 26.9 pg (ref 26.0–34.0)
MCHC: 32.6 g/dL (ref 30.0–36.0)
MCV: 82.5 fL (ref 80.0–100.0)
Monocytes Absolute: 0.3 10*3/uL (ref 0.1–1.0)
Monocytes Relative: 14 %
Neutro Abs: 1.3 10*3/uL — ABNORMAL LOW (ref 1.7–7.7)
Neutrophils Relative %: 52 %
Platelet Count: 104 10*3/uL — ABNORMAL LOW (ref 150–400)
RBC: 4.17 MIL/uL (ref 3.87–5.11)
RDW: 13.1 % (ref 11.5–15.5)
WBC Count: 2.5 10*3/uL — ABNORMAL LOW (ref 4.0–10.5)
nRBC: 0 % (ref 0.0–0.2)

## 2021-05-08 LAB — GENETIC SCREENING ORDER

## 2021-05-08 LAB — MAGNESIUM: Magnesium: 2 mg/dL (ref 1.7–2.4)

## 2021-05-08 MED ORDER — HEPARIN SOD (PORK) LOCK FLUSH 100 UNIT/ML IV SOLN
500.0000 [IU] | Freq: Once | INTRAVENOUS | Status: AC
  Administered 2021-05-08: 500 [IU]

## 2021-05-08 MED ORDER — SODIUM CHLORIDE 0.9% FLUSH
10.0000 mL | Freq: Once | INTRAVENOUS | Status: AC
  Administered 2021-05-08: 10 mL

## 2021-05-09 ENCOUNTER — Ambulatory Visit
Admission: RE | Admit: 2021-05-09 | Discharge: 2021-05-09 | Disposition: A | Discharge status: Home / Self Care | Payer: Managed Care, Other (non HMO) | Source: Ambulatory Visit | Attending: Radiation Oncology | Admitting: Radiation Oncology

## 2021-05-09 ENCOUNTER — Other Ambulatory Visit: Payer: Self-pay

## 2021-05-09 ENCOUNTER — Inpatient Hospital Stay (HOSPITAL_BASED_OUTPATIENT_CLINIC_OR_DEPARTMENT_OTHER): Payer: Managed Care, Other (non HMO) | Admitting: Hematology and Oncology

## 2021-05-09 ENCOUNTER — Encounter: Payer: Self-pay | Admitting: Genetic Counselor

## 2021-05-09 ENCOUNTER — Encounter: Payer: Self-pay | Admitting: Hematology and Oncology

## 2021-05-09 DIAGNOSIS — R197 Diarrhea, unspecified: Secondary | ICD-10-CM | POA: Diagnosis not present

## 2021-05-09 DIAGNOSIS — C539 Malignant neoplasm of cervix uteri, unspecified: Secondary | ICD-10-CM | POA: Diagnosis not present

## 2021-05-09 DIAGNOSIS — R110 Nausea: Secondary | ICD-10-CM | POA: Diagnosis not present

## 2021-05-09 DIAGNOSIS — H938X3 Other specified disorders of ear, bilateral: Secondary | ICD-10-CM

## 2021-05-09 DIAGNOSIS — D61818 Other pancytopenia: Secondary | ICD-10-CM | POA: Diagnosis not present

## 2021-05-09 LAB — RAD ONC ARIA SESSION SUMMARY
Course Elapsed Days: 26
Plan Fractions Treated to Date: 19
Plan Prescribed Dose Per Fraction: 1.8 Gy
Plan Total Fractions Prescribed: 25
Plan Total Prescribed Dose: 45 Gy
Reference Point Dosage Given to Date: 34.2 Gy
Reference Point Session Dosage Given: 1.8 Gy
Session Number: 19

## 2021-05-09 MED ORDER — DEXAMETHASONE 4 MG PO TABS: 8.0000 mg | ORAL_TABLET | Freq: Every day | ORAL | 0 refills | Status: DC

## 2021-05-09 NOTE — Assessment & Plan Note (Signed)
She continues to have intermittent tinnitus, nausea, diarrhea as well as worsening pancytopenia from recent treatment ?Based on the trend of her blood work and her symptoms, I am optimistic we can complete 5 cycles of treatment but not 6 ?We discussed the risk and benefits of continuing with current plan for chemotherapy tomorrow versus delaying treatment by 1 week to allow recovery ?The patient wants to proceed with treatment tomorrow ?She is aware of the risk of worsening pancytopenia next week ?I will prescribe additional steroid treatment for her nausea ?I will see her next week for further supportive care ?We discussed general plan moving forward ?Once she completes chemotherapy, she will continue to receive radiation treatment ?We will plan to repeat PET/CT imaging in 3 months for assessment ?

## 2021-05-09 NOTE — Assessment & Plan Note (Signed)
This is related to her treatment ?I recommend dexamethasone 8 mg daily for 3 days after treatment, along with her prescribed antiemetics as needed ?

## 2021-05-09 NOTE — Assessment & Plan Note (Signed)
This is due to treatment ?Observe for now ?We will proceed with similar dose adjustment as last week ?She does not need transfusion support ?We will not delay her treatment ?

## 2021-05-09 NOTE — Assessment & Plan Note (Signed)
She has mild intermittent diarrhea ?I recommend increased hydration as needed ?She can take Imodium as needed ?

## 2021-05-09 NOTE — Assessment & Plan Note (Signed)
She has intermittent tinnitus but not worse ?She will continue on reduced dose cisplatin as before ?

## 2021-05-09 NOTE — Progress Notes (Signed)
REFERRING PROVIDER: ?Lafonda Mosses, MD ?[address] ?[place],  [place] [address] ? ?PRIMARY PROVIDER:  ?Rosalee Kaufman, PA-C ? ?PRIMARY REASON FOR VISIT:  ?1. 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 Family history of colon cancer   ?3. Family history of uterine cancer   ?4. Family history of breast cancer   ? ? ?HISTORY OF PRESENT ILLNESS:   ?Robin Steele, a 56 y.o. female, was seen for a [place] cancer genetics consultation at the request of Dr. Berline Lopes due to a family history of cancer.  Robin Steele presents to clinic today to discuss the possibility of a hereditary predisposition to cancer, to discuss genetic testing, and to further clarify her future cancer risks, as well as potential cancer risks for family members.  ? ?At the age of 73, Robin Steele was diagnosed with adenocarcinoma of the cervix.  She also has a history of basal cell carcinoma on her back diagnosed at age 62.    ? 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 The patient has a history of prior abnormal paps, did not require biopsies are procedures, normalized. Has previously been HPV+.  More recent history is as follows: ?Pap 08/2020: ASC-H, HR HPV positive ?Colposcopy 09/14/20: ECC negative, CIN3 on cervical biopsies ?  ?02/15/2021 Pathology Results  ? [id number] ? ?Cone biopsy of cervix showed invasive adenocarcinoma, 2.1 cm in maximum dimension, 1.5 cm depth of invasion.  Endocervical and deep margins are involved.  High-grade squamous intraepithelial lesion with involvement of endocervical glands were present.  Endocervix curettage show detached fragment of adenocarcinoma. ?P16 is strongly positive. ?  ?03/09/2021 Initial Diagnosis  ? 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 ?  ?03/24/2021 PET scan  ? 1. Moderate activity in the cervix with maximum SUV of 5.6. Some of this could be postprocedural/inflammatory related to recent conization, but residual tumor is not excluded. No findings  of ?distant metastatic spread or hypermetabolic adenopathy. ?  ?03/30/2021 Imaging  ? MRI pelvis ?Repeat imaging was performed with intersection again showing the tumor biased towards the anterior aspect of the cervix but tracking towards the RIGHT posterolateral cervix with stromal disruption at the level of the inferior posterolateral cervix on the RIGHT (image 19/40) ?  ?Contrasted imaging (image 20/44) this shows contrast enhancement which follows this pattern. Early extension into the RIGHT posterolateral parametrium at the lower margin of the cervix is demonstrated on both contrasted and precontrast T2 weighted imaging. The lesion involves RIGHT anterolateral 2/3 of the cervix with loss of endocervical distinction noted on image 19 of 40 across the entire interface between the mass and the cervix, cervical ?disruption of stroma noted RIGHT posterolateral aspect as described.   ?Ovaries are normal. ?  ?Final impression: Cervical neoplasm with extension into cervical stroma and disruption of cervical stroma most notably along the RIGHT posterolateral margin with early extension into the RIGHT parametrium. ?  ?  ?03/31/2021 Cancer Staging  ? Staging form: Cervix Uteri, AJCC Version 9 ?- Clinical stage from 03/31/2021: Stage IIB (cT2b, cN0, cM0) - Signed by Heath Lark, MD on 03/31/2021 ?Stage prefix: Initial diagnosis ? ?  ?04/12/2021 -  Chemotherapy  ? Patient is on Treatment Plan : Cervical Cisplatin q7d  ? ?  ?  ?04/12/2021 Procedure  ? Placement of single lumen port a cath via right internal jugular vein. The catheter tip lies at the cavo-atrial junction. A power injectable port a cath was placed and is ready for immediate use. ?  ? ? ? ?  RISK FACTORS:  ?Mammogram within the last year: yes ?Number of breast biopsies: 0. ?Colonoscopy: yes, most recent in 2018 ?Hysterectomy: no.  ?Ovaries intact: yes.  ?Menarche was at age 72 or 16.  ?First live birth at age 31.  ?OCP use for more than 30 years.  ?HRT use: 0  years. ? ?Past Medical History:  ?Diagnosis Date  ? Anxiety   ? Arthritis 2011  ? in neck and back  ? Cancer [facility] 2022  ? skin cancer, 2 areas removed per pt  ? Depression   ? Difficulty sleeping   ? Family history of breast cancer 05/08/2021  ? Family history of colon cancer 05/08/2021  ? Family history of uterine cancer 05/08/2021  ? Hyperlipidemia   ? Pre-diabetes 2022  ? borderline, hgba1c 5.7 in august 2022  ? ? ?Past Surgical History:  ?Procedure Laterality Date  ? ANTERIOR CERVICAL DECOMP/DISCECTOMY FUSION N/A 11/18/2020  ? Procedure: CERVICAL FOUR -CERVICAL FIVE  ANTERIOR CERVICAL DISCECTOMY FUSION, ALLOGRAFT, PLATE;  Surgeon: Marybelle Killings, MD;  Location: [facility];  Service: Orthopedics;  Laterality: N/A;  ? BASAL CELL CARCINOMA EXCISION  2022  ? pt has had areas removed in the past as well  ? CERVICAL CONE BIOPSY  02/15/2021  ? HERNIA REPAIR    ? pt states she had an umbilical hernia repair about 25 years ago (around 1997), unsure if repaired with mesh  ? IR IMAGING GUIDED PORT INSERTION  04/11/2021  ? ? ?FAMILY HISTORY:  ?We obtained a detailed, 4-generation family history.  Significant diagnoses are listed below: ?Family History  ?Problem Relation Age of Onset  ? Cancer Mother   ?     maligant tumor on arm; dx 33s  ? Endometrial cancer Mother   ?     dx 52s  ? Breast cancer Maternal Aunt   ?     dx unknown age  ? Colon cancer Maternal Grandmother   ?     dx unknown age  ? Uterine cancer Half-Sister   ?     paternal half sister; dx before age 43  ? Skin cancer Half-Sister   ?     ? melanoma; d. 76  ? Cervical cancer Other   ?     mother's maternal half sister; dx after 37  ? Colon cancer Other   ?     MGM's mother; dx unknown age  ? Prostate cancer Neg Hx   ? Ovarian cancer Neg Hx   ? ? ? ?Robin Steele is unaware of previous family history of genetic testing for hereditary cancer risks. Affected family members were unavailable for genetic testing at this point in time.  There is no reported Ashkenazi Jewish ancestry.  There is no known consanguinity. ? ?GENETIC COUNSELING ASSESSMENT: Robin Steele is a 56 y.o. female with a family history of cancer which is somewhat suggestive of a hereditary cancer syndrome and predisposition to cancer given the presence of related cancers in the family. We, therefore, discussed and recommended the following at today's visit.  ? ?DISCUSSION: We discussed that 5 - 10% of cancer is hereditary.  Most cases of hereditary colon and uterine cancers are associated with mutations in the Lynch syndrome genes.  There are other genes that can be associated with hereditary colon or breast cancer syndromes.  We discussed that testing is beneficial for several reasons including knowing how to follow individuals for their cancer risks and understanding if other family members could be at risk for cancer and  allowing them to undergo genetic testing.  ? ?We reviewed the characteristics, features and inheritance patterns of hereditary cancer syndromes. We also discussed genetic testing, including the appropriate family members to test, the process of testing, insurance coverage and turn-around-time for results. We discussed the implications of a negative, positive, carrier and/or variant of uncertain significant result. We recommended Robin Steele pursue genetic testing for a panel that includes genes associated with colon cancer, uterine cancer, breast cancer, and melanoma cancer.  ? ?The CustomNext-Cancer+RNAinsight panel offered by [facility] includes sequencing and rearrangement analysis for the following 50 genes:  APC, ATM, AXIN2, BARD1, BAP1, BMPR1A, BRCA1, BRCA2, BRIP1, CDH1, CDK4, CDKN2A, CHEK2, DICER1, EPCAM, GREM1, HOXB13, MEN1, MLH1, MITF, MSH2, MSH3, MSH6, MUTYH, NBN, NF1, NF2, NTHL1, PALB2, PMS2, POLD1, POLE, POT1, PTEN, RAD51C, RAD51D, RECQL, RET, SDHA, SDHAF2, SDHB, SDHC, SDHD, SMAD4, SMARCA4, STK11, TP53, TSC1, TSC2, and VHL.  RNA data is routinely analyzed for use in variant interpretation for  all genes. ? ?Based on Robin Steele's family history of cancer, she meets medical criteria for genetic testing. Despite that she meets criteria, she may still have an out of pocket cost. We discussed that if her out of po

## 2021-05-09 NOTE — Progress Notes (Signed)
[facility] ?OFFICE PROGRESS NOTE ? ?Patient Care Team: ?Robin Steele as PCP - General (Physician Assistant) ?Janina Mayo, MD as PCP - Cardiology (Cardiology) ?Curlene Labrum, MD as Physician Assistant (Family Medicine) ?Awanda Mink Craige Cotta, RN as Oncology Nurse Navigator (Oncology) ? 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[place]) ?She continues to have intermittent tinnitus, nausea, diarrhea as well as worsening pancytopenia from recent treatment ?Based on the trend of her blood work and her symptoms, I am optimistic we can complete 5 cycles of treatment but not 6 ?We discussed the risk and benefits of continuing with current plan for chemotherapy tomorrow versus delaying treatment by 1 week to allow recovery ?The patient wants to proceed with treatment tomorrow ?She is aware of the risk of worsening pancytopenia next week ?I will prescribe additional steroid treatment for her nausea ?I will see her next week for further supportive care ?We discussed general plan moving forward ?Once she completes chemotherapy, she will continue to receive radiation treatment ?We will plan to repeat PET/CT imaging in 3 months for assessment ? ?Acquired pancytopenia ([place]) ?This is due to treatment ?Observe for now ?We will proceed with similar dose adjustment as last week ?She does not need transfusion support ?We will not delay her treatment ? ?Diarrhea ?She has mild intermittent diarrhea ?I recommend increased hydration as needed ?She can take Imodium as needed ? ?Nausea without vomiting ?This is related to her treatment ?I recommend dexamethasone 8 mg daily for 3 days after treatment, along with her prescribed antiemetics as needed ? ?Ototoxicity ?She has intermittent tinnitus but not worse ?She will continue on reduced dose cisplatin as before ? ?No orders of the defined types were placed in this encounter. ? ? ?All questions were answered. The patient knows to call the clinic with any problems,  questions or concerns. ?The total time spent in the appointment was 30 minutes encounter with patients including review of chart and various tests results, discussions about plan of care and coordination of care plan ?  ?Robin Lark, MD ?05/09/2021 3:14 PM ? ?INTERVAL HISTORY: ?Please see below for problem oriented charting. ?she returns for treatment follow-up seen prior to cycle 5 of cisplatin ?Since last time I saw her, she continues to have diarrhea intermittently ?She has experienced more nausea and has to take antiemetics but no vomiting ?Her intermittent tinnitus is stable ?No peripheral neuropathy ?She has mild sensation of lump in the throat but not sore throat or difficulties with swallowing ?She is able to keep herself well-hydrated ? ?REVIEW OF SYSTEMS:   ?Constitutional: Denies fevers, chills or abnormal weight loss ?Eyes: Denies blurriness of vision ?Respiratory: Denies cough, dyspnea or wheezes ?Cardiovascular: Denies palpitation, chest discomfort or lower extremity swelling ?Skin: Denies abnormal skin rashes ?Lymphatics: Denies new lymphadenopathy or easy bruising ?Behavioral/Psych: Mood is stable, no new changes  ?All other systems were reviewed with the patient and are negative. ? ?I have reviewed the past medical history, past surgical history, social history and family history with the patient and they are unchanged from previous note. ? ?ALLERGIES:  has No Known Allergies. ? ?MEDICATIONS:  ?Current Outpatient Medications  ?Medication Sig Dispense Refill  ? dexamethasone (DECADRON) 4 MG tablet Take 2 tablets (8 mg total) by mouth daily. 14 tablet 0  ? Ascorbic Acid (VITAMIN C) 1000 MG tablet Take 1,000 mg by mouth at bedtime.    ? Cyanocobalamin (B-12) 5000 MCG CAPS Take 5,000 mcg by mouth at bedtime.    ? DULoxetine (  CYMBALTA) 60 MG capsule Take by mouth.    ? EYSUVIS 0.25 % SUSP Apply to eye.    ? lidocaine-diphenhydrAMINE-alum & mag hydroxide-simeth-nystatin Swish and swallow 5 MLs by mouth 4  times a day as directed 240 mL 0  ? lidocaine-prilocaine (EMLA) cream Apply to affected area once (Patient not taking: Reported on 04/04/2021) 30 g 3  ? Magnesium Oxide 250 MG TABS Take 1 tablet by mouth every morning.    ? Multiple Vitamins-Minerals (CENTRUM SILVER 50+WOMEN) TABS Take 1 tablet by mouth at bedtime.    ? Omega-3 1000 MG CAPS Take 1,000 mg by mouth at bedtime.    ? ondansetron (ZOFRAN) 8 MG tablet Take 1 tablet by mouth 2  times daily as needed. Start on the third day after cisplatin chemotherapy. (Patient not taking: Reported on 04/04/2021) 30 tablet 1  ? prochlorperazine (COMPAZINE) 10 MG tablet Take 1 tablet  by mouth every 6  hours as needed (Nausea or vomiting). (Patient not taking: Reported on 04/04/2021) 30 tablet 1  ? Red Yeast Rice 600 MG TABS Take 1,200 mg by mouth at bedtime.    ? temazepam (RESTORIL) 30 MG capsule Take by mouth.    ? valACYclovir (VALTREX) 500 MG tablet Take 500 mg by mouth at bedtime.    ? ?No current facility-administered medications for this visit.  ? ? 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 The patient has a history of prior abnormal paps, did not require biopsies are procedures, normalized. Has previously been HPV+.  More recent history is as follows: ?Pap 08/2020: ASC-H, HR HPV positive ?Colposcopy 09/14/20: ECC negative, CIN3 on cervical biopsies ?  ?02/15/2021 Pathology Results  ? [id number] ? ?Cone biopsy of cervix showed invasive adenocarcinoma, 2.1 cm in maximum dimension, 1.5 cm depth of invasion.  Endocervical and deep margins are involved.  High-grade squamous intraepithelial lesion with involvement of endocervical glands were present.  Endocervix curettage show detached fragment of adenocarcinoma. ?P16 is strongly positive. ?  ?03/09/2021 Initial Diagnosis  ? Cervical adenocarcinoma ([place]) ? ?  ?03/24/2021 PET scan  ? 1. Moderate activity in the cervix with maximum SUV of 5.6. Some of this could  be postprocedural/inflammatory related to recent conization, but residual tumor is not excluded. No findings of ?distant metastatic spread or hypermetabolic adenopathy. ?  ?03/30/2021 Imaging  ? MRI pelvis ?Repeat imaging was performed with intersection again showing the tumor biased towards the anterior aspect of the cervix but tracking towards the RIGHT posterolateral cervix with stromal disruption at the level of the inferior posterolateral cervix on the RIGHT (image 19/40) ?  ?Contrasted imaging (image 20/44) this shows contrast enhancement which follows this pattern. Early extension into the RIGHT posterolateral parametrium at the lower margin of the cervix is demonstrated on both contrasted and precontrast T2 weighted imaging. The lesion involves RIGHT anterolateral 2/3 of the cervix with loss of endocervical distinction noted on image 19 of 40 across the entire interface between the mass and the cervix, cervical ?disruption of stroma noted RIGHT posterolateral aspect as described.   ?Ovaries are normal. ?  ?Final impression: Cervical neoplasm with extension into cervical stroma and disruption of cervical stroma most notably along the RIGHT posterolateral margin with early extension into the RIGHT parametrium. ?  ?  ?03/31/2021 Cancer Staging  ? Staging form: Cervix Uteri, AJCC Version 9 ?- Clinical stage from 03/31/2021: Stage IIB (cT2b, cN0, cM0) - Signed by Robin Lark, MD on 03/31/2021 ?Stage prefix:  Initial diagnosis ? ?  ?04/12/2021 -  Chemotherapy  ? Patient is on Treatment Plan : Cervical Cisplatin q7d  ? ?  ?  ?04/12/2021 Procedure  ? Placement of single lumen port a cath via right internal jugular vein. The catheter tip lies at the cavo-atrial junction. A power injectable port a cath was placed and is ready for immediate use. ?  ? ? ?PHYSICAL EXAMINATION: ?ECOG PERFORMANCE STATUS: 1 - Symptomatic but completely ambulatory ? ?Vitals:  ? 05/09/21 1458  ?BP: 129/84  ?Pulse: 85  ?Resp: 18  ?Temp: 97.8 ?F (36.6  ?C)  ?SpO2: 100%  ? ?Filed Weights  ? 05/09/21 1458  ?Weight: 146 lb 3.2 oz (66.3 kg)  ? ? ?GENERAL:alert, no distress and comfortable ?NEURO: alert & oriented x 3 with fluent speech, no focal motor/senso

## 2021-05-10 ENCOUNTER — Inpatient Hospital Stay: Payer: Managed Care, Other (non HMO)

## 2021-05-10 ENCOUNTER — Ambulatory Visit
Admission: RE | Admit: 2021-05-10 | Discharge: 2021-05-10 | Disposition: A | Discharge status: Home / Self Care | Payer: Managed Care, Other (non HMO) | Source: Ambulatory Visit | Attending: Radiation Oncology | Admitting: Radiation Oncology

## 2021-05-10 ENCOUNTER — Other Ambulatory Visit: Payer: Self-pay

## 2021-05-10 ENCOUNTER — Other Ambulatory Visit (HOSPITAL_COMMUNITY): Payer: Self-pay | Admitting: Radiation Oncology

## 2021-05-10 VITALS — BP 126/83 | HR 73 | Temp 97.7°F | Resp 18

## 2021-05-10 DIAGNOSIS — C539 Malignant neoplasm of cervix uteri, unspecified: Secondary | ICD-10-CM

## 2021-05-10 LAB — RAD ONC ARIA SESSION SUMMARY
Course Elapsed Days: 27
Plan Fractions Treated to Date: 20
Plan Prescribed Dose Per Fraction: 1.8 Gy
Plan Total Fractions Prescribed: 25
Plan Total Prescribed Dose: 45 Gy
Reference Point Dosage Given to Date: 36 Gy
Reference Point Session Dosage Given: 1.8 Gy
Session Number: 20

## 2021-05-10 MED ORDER — SODIUM CHLORIDE 0.9 % IV SOLN
10.0000 mg | Freq: Once | INTRAVENOUS | Status: AC
  Administered 2021-05-10: 10 mg via INTRAVENOUS
  Filled 2021-05-10: qty 10

## 2021-05-10 MED ORDER — MAGNESIUM SULFATE 2 GM/50ML IV SOLN
2.0000 g | Freq: Once | INTRAVENOUS | Status: AC
  Administered 2021-05-10: 2 g via INTRAVENOUS
  Filled 2021-05-10: qty 50

## 2021-05-10 MED ORDER — SODIUM CHLORIDE 0.9% FLUSH
10.0000 mL | INTRAVENOUS | Status: DC | PRN
  Administered 2021-05-10: 10 mL

## 2021-05-10 MED ORDER — POTASSIUM CHLORIDE IN NACL 20-0.9 MEQ/L-% IV SOLN
Freq: Once | INTRAVENOUS | Status: AC
  Filled 2021-05-10: qty 1000

## 2021-05-10 MED ORDER — HEPARIN SOD (PORK) LOCK FLUSH 100 UNIT/ML IV SOLN
500.0000 [IU] | Freq: Once | INTRAVENOUS | Status: AC | PRN
  Administered 2021-05-10: 500 [IU]

## 2021-05-10 MED ORDER — SODIUM CHLORIDE 0.9 % IV SOLN: Freq: Once | INTRAVENOUS | Status: AC

## 2021-05-10 MED ORDER — SODIUM CHLORIDE 0.9 % IV SOLN
150.0000 mg | Freq: Once | INTRAVENOUS | Status: AC
  Administered 2021-05-10: 150 mg via INTRAVENOUS
  Filled 2021-05-10: qty 150

## 2021-05-10 MED ORDER — PALONOSETRON HCL INJECTION 0.25 MG/5ML
0.2500 mg | Freq: Once | INTRAVENOUS | Status: AC
  Administered 2021-05-10: 0.25 mg via INTRAVENOUS
  Filled 2021-05-10: qty 5

## 2021-05-10 MED ORDER — SODIUM CHLORIDE 0.9 % IV SOLN
32.0000 mg/m2 | Freq: Once | INTRAVENOUS | Status: AC
  Administered 2021-05-10: 56 mg via INTRAVENOUS
  Filled 2021-05-10: qty 56

## 2021-05-10 NOTE — Patient Instructions (Addendum)
[facility]   ?Happy Last Treatment Day! ?Discharge Instructions: ?Thank you for choosing [facility] to provide your oncology and hematology care.  ? ?If you have a lab appointment with the [facility], please go directly to the [facility] and check in at the registration area. ?  ?Wear comfortable clothing and clothing appropriate for easy access to any Portacath or PICC line.  ? ?We strive to give you quality time with your provider. You may need to reschedule your appointment if you arrive late (15 or more minutes).  Arriving late affects you and other patients whose appointments are after yours.  Also, if you miss three or more appointments without notifying the office, you may be dismissed from the clinic at the provider?s discretion.    ?  ?For prescription refill requests, have your pharmacy contact our office and allow 72 hours for refills to be completed.   ? ?Today you received the following chemotherapy and/or immunotherapy agents: Cisplatin    ?  ?To help prevent nausea and vomiting after your treatment, we encourage you to take your nausea medication as directed. ? ?BELOW ARE SYMPTOMS THAT SHOULD BE REPORTED IMMEDIATELY: ?*FEVER GREATER THAN 100.4 F (38 ?C) OR HIGHER ?*CHILLS OR SWEATING ?*NAUSEA AND VOMITING THAT IS NOT CONTROLLED WITH YOUR NAUSEA MEDICATION ?*UNUSUAL SHORTNESS OF BREATH ?*UNUSUAL BRUISING OR BLEEDING ?*URINARY PROBLEMS (pain or burning when urinating, or frequent urination) ?*BOWEL PROBLEMS (unusual diarrhea, constipation, pain near the anus) ?TENDERNESS IN MOUTH AND THROAT WITH OR WITHOUT PRESENCE OF ULCERS (sore throat, sores in mouth, or a toothache) ?UNUSUAL RASH, SWELLING OR PAIN  ?UNUSUAL VAGINAL DISCHARGE OR ITCHING  ? ?Items with * indicate a potential emergency and should be followed up as soon as possible or go to the Emergency Department if any problems should occur. ? ?Please show the CHEMOTHERAPY ALERT CARD or  IMMUNOTHERAPY ALERT CARD at check-in to the Emergency Department and triage nurse. ? ?Should you have questions after your visit or need to cancel or reschedule your appointment, please contact [facility]  Dept: [phone]  and follow the prompts.  Office hours are 8:00 a.m. to 4:30 p.m. Monday - Friday. Please note that voicemails left after 4:00 p.m. may not be returned until the following business day.  We are closed weekends and major holidays. You have access to a nurse at all times for urgent questions. Please call the main number to the clinic Dept: [phone] and follow the prompts. ? ? ?For any non-urgent questions, you may also contact your provider using MyChart. We now offer e-Visits for anyone 65 and older to request care online for non-urgent symptoms. For details visit [email or web address]. ?  ?Also download the MyChart app! Go to the app store, search "MyChart", open the app, select [facility], and log in with your MyChart username and password. ? ?Due to Covid, a mask is required upon entering the hospital/clinic. If you do not have a mask, one will be given to you upon arrival. For doctor visits, patients may have 1 support person aged 13 or older with them. For treatment visits, patients cannot have anyone with them due to current Covid guidelines and our immunocompromised population.  ? ?

## 2021-05-10 NOTE — Progress Notes (Signed)
Per Dr. Burr Medico - okay to proceed with urine output of 100 ml for cisplatin infusion.  ?

## 2021-05-11 ENCOUNTER — Ambulatory Visit
Admission: RE | Admit: 2021-05-11 | Discharge: 2021-05-11 | Disposition: A | Discharge status: Home / Self Care | Payer: Managed Care, Other (non HMO) | Source: Ambulatory Visit | Attending: Radiation Oncology | Admitting: Radiation Oncology

## 2021-05-11 ENCOUNTER — Other Ambulatory Visit: Payer: Self-pay

## 2021-05-11 DIAGNOSIS — C539 Malignant neoplasm of cervix uteri, unspecified: Secondary | ICD-10-CM | POA: Diagnosis not present

## 2021-05-11 LAB — RAD ONC ARIA SESSION SUMMARY
Course Elapsed Days: 28
Plan Fractions Treated to Date: 21
Plan Prescribed Dose Per Fraction: 1.8 Gy
Plan Total Fractions Prescribed: 25
Plan Total Prescribed Dose: 45 Gy
Reference Point Dosage Given to Date: 37.8 Gy
Reference Point Session Dosage Given: 1.8 Gy
Session Number: 21

## 2021-05-12 ENCOUNTER — Other Ambulatory Visit: Payer: Self-pay

## 2021-05-12 ENCOUNTER — Ambulatory Visit
Admission: RE | Admit: 2021-05-12 | Discharge: 2021-05-12 | Disposition: A | Discharge status: Home / Self Care | Payer: Managed Care, Other (non HMO) | Source: Ambulatory Visit | Attending: Radiation Oncology | Admitting: Radiation Oncology

## 2021-05-12 DIAGNOSIS — C539 Malignant neoplasm of cervix uteri, unspecified: Secondary | ICD-10-CM | POA: Diagnosis not present

## 2021-05-12 LAB — RAD ONC ARIA SESSION SUMMARY
Course Elapsed Days: 29
Plan Fractions Treated to Date: 22
Plan Prescribed Dose Per Fraction: 1.8 Gy
Plan Total Fractions Prescribed: 25
Plan Total Prescribed Dose: 45 Gy
Reference Point Dosage Given to Date: 39.6 Gy
Reference Point Session Dosage Given: 1.8 Gy
Session Number: 22

## 2021-05-15 ENCOUNTER — Ambulatory Visit
Admission: RE | Admit: 2021-05-15 | Discharge: 2021-05-15 | Disposition: A | Discharge status: Home / Self Care | Payer: Managed Care, Other (non HMO) | Source: Ambulatory Visit | Attending: Radiation Oncology | Admitting: Radiation Oncology

## 2021-05-15 ENCOUNTER — Other Ambulatory Visit: Payer: Self-pay

## 2021-05-15 ENCOUNTER — Inpatient Hospital Stay: Payer: Managed Care, Other (non HMO)

## 2021-05-15 DIAGNOSIS — C539 Malignant neoplasm of cervix uteri, unspecified: Secondary | ICD-10-CM | POA: Diagnosis not present

## 2021-05-15 LAB — MAGNESIUM: Magnesium: 1.9 mg/dL (ref 1.7–2.4)

## 2021-05-15 LAB — RAD ONC ARIA SESSION SUMMARY
Course Elapsed Days: 32
Plan Fractions Treated to Date: 23
Plan Prescribed Dose Per Fraction: 1.8 Gy
Plan Total Fractions Prescribed: 25
Plan Total Prescribed Dose: 45 Gy
Reference Point Dosage Given to Date: 41.4 Gy
Reference Point Session Dosage Given: 1.8 Gy
Session Number: 23

## 2021-05-15 LAB — BASIC METABOLIC PANEL - CANCER CENTER ONLY
Anion gap: 7 (ref 5–15)
BUN: 16 mg/dL (ref 6–20)
CO2: 25 mmol/L (ref 22–32)
Calcium: 8.8 mg/dL — ABNORMAL LOW (ref 8.9–10.3)
Chloride: 103 mmol/L (ref 98–111)
Creatinine: 0.84 mg/dL (ref 0.44–1.00)
GFR, Estimated: >60 mL/min (ref >60)
Glucose, Bld: 202 mg/dL — ABNORMAL HIGH (ref 70–99)
Potassium: 4.1 mmol/L (ref 3.5–5.1)
Sodium: 135 mmol/L (ref 135–145)

## 2021-05-15 LAB — CBC WITH DIFFERENTIAL (CANCER CENTER ONLY)
Abs Immature Granulocytes: 0.01 10*3/uL (ref 0.00–0.07)
Basophils Absolute: 0 10*3/uL (ref 0.0–0.1)
Basophils Relative: 0 %
Eosinophils Absolute: 0 10*3/uL (ref 0.0–0.5)
Eosinophils Relative: 0 %
HCT: 33.4 % — ABNORMAL LOW (ref 36.0–46.0)
Hemoglobin: 10.9 g/dL — ABNORMAL LOW (ref 12.0–15.0)
Immature Granulocytes: 0 %
Lymphocytes Relative: 7 %
Lymphs Abs: 0.2 10*3/uL — ABNORMAL LOW (ref 0.7–4.0)
MCH: 26.8 pg (ref 26.0–34.0)
MCHC: 32.6 g/dL (ref 30.0–36.0)
MCV: 82.3 fL (ref 80.0–100.0)
Monocytes Absolute: 0.1 10*3/uL (ref 0.1–1.0)
Monocytes Relative: 2 %
Neutro Abs: 2.8 10*3/uL (ref 1.7–7.7)
Neutrophils Relative %: 91 %
Platelet Count: 102 10*3/uL — ABNORMAL LOW (ref 150–400)
RBC: 4.06 MIL/uL (ref 3.87–5.11)
RDW: 13.4 % (ref 11.5–15.5)
WBC Count: 3.1 10*3/uL — ABNORMAL LOW (ref 4.0–10.5)
nRBC: 0 % (ref 0.0–0.2)

## 2021-05-15 MED ORDER — SODIUM CHLORIDE 0.9% FLUSH
10.0000 mL | Freq: Once | INTRAVENOUS | Status: AC
  Administered 2021-05-15: 10 mL

## 2021-05-15 MED ORDER — HEPARIN SOD (PORK) LOCK FLUSH 100 UNIT/ML IV SOLN
500.0000 [IU] | Freq: Once | INTRAVENOUS | Status: AC
  Administered 2021-05-15: 500 [IU]

## 2021-05-16 ENCOUNTER — Encounter: Payer: Self-pay | Admitting: Hematology and Oncology

## 2021-05-16 ENCOUNTER — Ambulatory Visit
Admission: RE | Admit: 2021-05-16 | Discharge: 2021-05-16 | Disposition: A | Discharge status: Home / Self Care | Payer: Managed Care, Other (non HMO) | Source: Ambulatory Visit | Attending: Radiation Oncology | Admitting: Radiation Oncology

## 2021-05-16 ENCOUNTER — Inpatient Hospital Stay (HOSPITAL_BASED_OUTPATIENT_CLINIC_OR_DEPARTMENT_OTHER): Payer: Managed Care, Other (non HMO) | Admitting: Hematology and Oncology

## 2021-05-16 ENCOUNTER — Other Ambulatory Visit: Payer: Self-pay

## 2021-05-16 DIAGNOSIS — R110 Nausea: Secondary | ICD-10-CM

## 2021-05-16 DIAGNOSIS — C539 Malignant neoplasm of cervix uteri, unspecified: Secondary | ICD-10-CM | POA: Diagnosis not present

## 2021-05-16 DIAGNOSIS — D61818 Other pancytopenia: Secondary | ICD-10-CM

## 2021-05-16 LAB — RAD ONC ARIA SESSION SUMMARY
Course Elapsed Days: 33
Plan Fractions Treated to Date: 24
Plan Prescribed Dose Per Fraction: 1.8 Gy
Plan Total Fractions Prescribed: 25
Plan Total Prescribed Dose: 45 Gy
Reference Point Dosage Given to Date: 43.2 Gy
Reference Point Session Dosage Given: 1.8 Gy
Session Number: 24

## 2021-05-16 NOTE — Assessment & Plan Note (Signed)
This is slightly worse due to recent treatment but she is not symptomatic ?Observe closely for now ?

## 2021-05-16 NOTE — Progress Notes (Signed)
[facility] ?OFFICE PROGRESS NOTE ? ?Patient Care Team: ?Rosine Door as PCP - General (Physician Assistant) ?Janina Mayo, MD as PCP - Cardiology (Cardiology) ?Curlene Labrum, MD as Physician Assistant (Family Medicine) ?Awanda Mink Craige Cotta, RN as Oncology Nurse Navigator (Oncology) ? 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[place]) ?She tolerated last cycle of treatment better with addition of dexamethasone for few days for her to control her nausea ?Her ototoxicity is not bad ?Her blood counts are slightly worse but not severe ?I plan to see her again in 6 to 8 weeks for further follow-up ? ?Acquired pancytopenia ([place]) ?This is slightly worse due to recent treatment but she is not symptomatic ?Observe closely for now ? ?Nausea without vomiting ?Her nausea is better controlled with antiemetics ?She will continue to take antiemetics as needed ? ?Hypocalcemia ?I recommend calcium with vitamin D supplement ? ?No orders of the defined types were placed in this encounter. ? ? ?All questions were answered. The patient knows to call the clinic with any problems, questions or concerns. ?The total time spent in the appointment was 20 minutes encounter with patients including review of chart and various tests results, discussions about plan of care and coordination of care plan ?  ?Heath Lark, MD ?05/16/2021 3:07 PM ? ?INTERVAL HISTORY: ?Please see below for problem oriented charting. ?she returns for treatment follow-up after completion of chemotherapy ?She felt better with last cycle due to addition of dexamethasone ?Denies worsening ototoxicity or peripheral neuropathy ? ?REVIEW OF SYSTEMS:   ?Constitutional: Denies fevers, chills or abnormal weight loss ?Eyes: Denies blurriness of vision ?Ears, nose, mouth, throat, and face: Denies mucositis or sore throat ?Respiratory: Denies cough, dyspnea or wheezes ?Cardiovascular: Denies palpitation, chest discomfort or lower extremity swelling ?Skin:  Denies abnormal skin rashes ?Lymphatics: Denies new lymphadenopathy or easy bruising ?Neurological:Denies numbness, tingling or new weaknesses ?Behavioral/Psych: Mood is stable, no new changes  ?All other systems were reviewed with the patient and are negative. ? ?I have reviewed the past medical history, past surgical history, social history and family history with the patient and they are unchanged from previous note. ? ?ALLERGIES:  has No Known Allergies. ? ?MEDICATIONS:  ?Current Outpatient Medications  ?Medication Sig Dispense Refill  ? Ascorbic Acid (VITAMIN C) 1000 MG tablet Take 1,000 mg by mouth at bedtime.    ? Cyanocobalamin (B-12) 5000 MCG CAPS Take 5,000 mcg by mouth at bedtime.    ? dexamethasone (DECADRON) 4 MG tablet Take 2 tablets (8 mg total) by mouth daily. 14 tablet 0  ? DULoxetine (CYMBALTA) 60 MG capsule Take by mouth.    ? EYSUVIS 0.25 % SUSP Apply to eye.    ? lidocaine-diphenhydrAMINE-alum & mag hydroxide-simeth-nystatin Swish and swallow 5 MLs by mouth 4 times a day as directed 240 mL 0  ? lidocaine-prilocaine (EMLA) cream Apply to affected area once (Patient not taking: Reported on 04/04/2021) 30 g 3  ? Magnesium Oxide 250 MG TABS Take 1 tablet by mouth every morning.    ? Multiple Vitamins-Minerals (CENTRUM SILVER 50+WOMEN) TABS Take 1 tablet by mouth at bedtime.    ? Omega-3 1000 MG CAPS Take 1,000 mg by mouth at bedtime.    ? ondansetron (ZOFRAN) 8 MG tablet Take 1 tablet by mouth 2  times daily as needed. Start on the third day after cisplatin chemotherapy. (Patient not taking: Reported on 04/04/2021) 30 tablet 1  ? prochlorperazine (COMPAZINE) 10 MG tablet Take 1 tablet  by mouth every  6  hours as needed (Nausea or vomiting). (Patient not taking: Reported on 04/04/2021) 30 tablet 1  ? Red Yeast Rice 600 MG TABS Take 1,200 mg by mouth at bedtime.    ? temazepam (RESTORIL) 30 MG capsule Take by mouth.    ? valACYclovir (VALTREX) 500 MG tablet Take 500 mg by mouth at bedtime.    ? ?No  current facility-administered medications for this visit.  ? ? ?SUMMARY OF ONCOLOGIC HISTORY: ?Oncology History  ?Cervical adenocarcinoma ([place])  ?09/14/2020 Initial Diagnosis  ? The patient has a history of prior abnormal paps, did not require biopsies are procedures, normalized. Has previously been HPV+.  More recent history is as follows: ?Pap 08/2020: ASC-H, HR HPV positive ?Colposcopy 09/14/20: ECC negative, CIN3 on cervical biopsies ?  ?02/15/2021 Pathology Results  ? [id number] ? ?Cone biopsy of cervix showed invasive adenocarcinoma, 2.1 cm in maximum dimension, 1.5 cm depth of invasion.  Endocervical and deep margins are involved.  High-grade squamous intraepithelial lesion with involvement of endocervical glands were present.  Endocervix curettage show detached fragment of adenocarcinoma. ?P16 is strongly positive. ?  ?03/09/2021 Initial Diagnosis  ? Cervical adenocarcinoma ([place]) ? ?  ?03/24/2021 PET scan  ? 1. Moderate activity in the cervix with maximum SUV of 5.6. Some of this could be postprocedural/inflammatory related to recent conization, but residual tumor is not excluded. No findings of ?distant metastatic spread or hypermetabolic adenopathy. ?  ?03/30/2021 Imaging  ? MRI pelvis ?Repeat imaging was performed with intersection again showing the tumor biased towards the anterior aspect of the cervix but tracking towards the RIGHT posterolateral cervix with stromal disruption at the level of the inferior posterolateral cervix on the RIGHT (image 19/40) ?  ?Contrasted imaging (image 20/44) this shows contrast enhancement which follows this pattern. Early extension into the RIGHT posterolateral parametrium at the lower margin of the cervix is demonstrated on both contrasted and precontrast T2 weighted imaging. The lesion involves RIGHT anterolateral 2/3 of the cervix with loss of endocervical distinction noted on image 19 of 40 across the entire interface between the mass and the cervix, cervical ?disruption of  stroma noted RIGHT posterolateral aspect as described.   ?Ovaries are normal. ?  ?Final impression: Cervical neoplasm with extension into cervical stroma and disruption of cervical stroma most notably along the RIGHT posterolateral margin with early extension into the RIGHT parametrium. ?  ?  ?03/31/2021 Cancer Staging  ? Staging form: Cervix Uteri, AJCC Version 9 ?- Clinical stage from 03/31/2021: Stage IIB (cT2b, cN0, cM0) - Signed by Heath Lark, MD on 03/31/2021 ?Stage prefix: Initial diagnosis ? ?  ?04/12/2021 -  Chemotherapy  ? Patient is on Treatment Plan : Cervical Cisplatin q7d  ? ?  ?  ?04/12/2021 Procedure  ? Placement of single lumen port a cath via right internal jugular vein. The catheter tip lies at the cavo-atrial junction. A power injectable port a cath was placed and is ready for immediate use. ?  ? ? ?PHYSICAL EXAMINATION: ?ECOG PERFORMANCE STATUS: 1 - Symptomatic but completely ambulatory ? ?Vitals:  ? 05/16/21 1446  ?BP: (!) 150/87  ?Pulse: 79  ?Resp: 17  ?Temp: 97.6 ?F (36.4 ?C)  ?SpO2: 100%  ? ?Filed Weights  ? 05/16/21 1446  ?Weight: 144 lb 12.8 oz (65.7 kg)  ? ? ?GENERAL:alert, no distress and comfortable ?NEURO: alert & oriented x 3 with fluent speech, no focal motor/sensory deficits ? ?LABORATORY DATA:  ?I have reviewed the data as listed ?   ?Component Value Date/Time  ?  NA 135 05/15/2021 1607  ? K 4.1 05/15/2021 1607  ? CL 103 05/15/2021 1607  ? CO2 25 05/15/2021 1607  ? GLUCOSE 202 (H) 05/15/2021 1607  ? BUN 16 05/15/2021 1607  ? CREATININE 0.84 05/15/2021 1607  ? CALCIUM 8.8 (L) 05/15/2021 1607  ? GFRNONAA >60 05/15/2021 1607  ? ? ?No results found for: SPEP, UPEP ? ?Lab Results  ?Component Value Date  ? WBC 3.1 (L) 05/15/2021  ? NEUTROABS 2.8 05/15/2021  ? HGB 10.9 (L) 05/15/2021  ? HCT 33.4 (L) 05/15/2021  ? MCV 82.3 05/15/2021  ? PLT 102 (L) 05/15/2021  ? ? ?  Chemistry   ?   ?Component Value Date/Time  ? NA 135 05/15/2021 1607  ? K 4.1 05/15/2021 1607  ? CL 103 05/15/2021 1607  ? CO2 25  05/15/2021 1607  ? BUN 16 05/15/2021 1607  ? CREATININE 0.84 05/15/2021 1607  ?    ?Component Value Date/Time  ? CALCIUM 8.8 (L) 05/15/2021 1607  ?  ? ? ?

## 2021-05-16 NOTE — Assessment & Plan Note (Signed)
Her nausea is better controlled with antiemetics ?She will continue to take antiemetics as needed ?

## 2021-05-16 NOTE — Assessment & Plan Note (Signed)
She tolerated last cycle of treatment better with addition of dexamethasone for few days for her to control her nausea ?Her ototoxicity is not bad ?Her blood counts are slightly worse but not severe ?I plan to see her again in 6 to 8 weeks for further follow-up ?

## 2021-05-16 NOTE — Assessment & Plan Note (Signed)
I recommend calcium with vitamin D supplement ?

## 2021-05-17 ENCOUNTER — Other Ambulatory Visit: Payer: Self-pay

## 2021-05-17 ENCOUNTER — Inpatient Hospital Stay: Payer: Managed Care, Other (non HMO)

## 2021-05-17 ENCOUNTER — Ambulatory Visit
Admission: RE | Admit: 2021-05-17 | Discharge: 2021-05-17 | Disposition: A | Discharge status: Home / Self Care | Payer: Managed Care, Other (non HMO) | Source: Ambulatory Visit | Attending: Radiation Oncology | Admitting: Radiation Oncology

## 2021-05-17 DIAGNOSIS — C539 Malignant neoplasm of cervix uteri, unspecified: Secondary | ICD-10-CM | POA: Diagnosis not present

## 2021-05-17 LAB — RAD ONC ARIA SESSION SUMMARY
Course Elapsed Days: 34
Plan Fractions Treated to Date: 25
Plan Prescribed Dose Per Fraction: 1.8 Gy
Plan Total Fractions Prescribed: 25
Plan Total Prescribed Dose: 45 Gy
Reference Point Dosage Given to Date: 45 Gy
Reference Point Session Dosage Given: 1.8 Gy
Session Number: 25

## 2021-05-18 ENCOUNTER — Other Ambulatory Visit: Payer: Self-pay

## 2021-05-18 ENCOUNTER — Telehealth: Payer: Self-pay | Admitting: Oncology

## 2021-05-18 ENCOUNTER — Ambulatory Visit
Admission: RE | Admit: 2021-05-18 | Discharge: 2021-05-18 | Disposition: A | Discharge status: Home / Self Care | Payer: Managed Care, Other (non HMO) | Source: Ambulatory Visit | Attending: Radiation Oncology | Admitting: Radiation Oncology

## 2021-05-18 DIAGNOSIS — C539 Malignant neoplasm of cervix uteri, unspecified: Secondary | ICD-10-CM | POA: Diagnosis not present

## 2021-05-18 LAB — RAD ONC ARIA SESSION SUMMARY
Course Elapsed Days: 35
Plan Fractions Treated to Date: 1
Plan Prescribed Dose Per Fraction: 1.8 Gy
Plan Total Fractions Prescribed: 5
Plan Total Prescribed Dose: 9 Gy
Reference Point Dosage Given to Date: 46.8 Gy
Reference Point Session Dosage Given: 1.8 Gy
Session Number: 26

## 2021-05-18 NOTE — Telephone Encounter (Signed)
Called Robin Steele and advised her that the plan is for Dr. Berline Lopes to see her for an appointment after her treatment is finished and she has had a PET scan.  She verbalized understanding and agreement of the plan. ?

## 2021-05-19 ENCOUNTER — Ambulatory Visit
Admission: RE | Admit: 2021-05-19 | Discharge: 2021-05-19 | Disposition: A | Discharge status: Home / Self Care | Payer: Managed Care, Other (non HMO) | Source: Ambulatory Visit | Attending: Radiation Oncology | Admitting: Radiation Oncology

## 2021-05-19 ENCOUNTER — Other Ambulatory Visit: Payer: Self-pay

## 2021-05-19 ENCOUNTER — Other Ambulatory Visit: Payer: Self-pay | Admitting: Hematology and Oncology

## 2021-05-19 DIAGNOSIS — C539 Malignant neoplasm of cervix uteri, unspecified: Secondary | ICD-10-CM | POA: Diagnosis not present

## 2021-05-19 LAB — RAD ONC ARIA SESSION SUMMARY
Course Elapsed Days: 36
Plan Fractions Treated to Date: 2
Plan Prescribed Dose Per Fraction: 1.8 Gy
Plan Total Fractions Prescribed: 5
Plan Total Prescribed Dose: 9 Gy
Reference Point Dosage Given to Date: 48.6 Gy
Reference Point Session Dosage Given: 1.8 Gy
Session Number: 27

## 2021-05-19 MED ORDER — DEXAMETHASONE 4 MG PO TABS: 4.0000 mg | ORAL_TABLET | Freq: Every day | ORAL | 0 refills | Status: DC

## 2021-05-22 ENCOUNTER — Other Ambulatory Visit: Payer: Self-pay

## 2021-05-22 ENCOUNTER — Ambulatory Visit: Payer: Managed Care, Other (non HMO)

## 2021-05-22 ENCOUNTER — Inpatient Hospital Stay: Payer: Managed Care, Other (non HMO)

## 2021-05-22 ENCOUNTER — Inpatient Hospital Stay: Payer: Managed Care, Other (non HMO) | Admitting: Hematology and Oncology

## 2021-05-22 ENCOUNTER — Ambulatory Visit
Admission: RE | Admit: 2021-05-22 | Discharge: 2021-05-22 | Disposition: A | Discharge status: Home / Self Care | Payer: Managed Care, Other (non HMO) | Source: Ambulatory Visit | Attending: Radiation Oncology | Admitting: Radiation Oncology

## 2021-05-22 DIAGNOSIS — C539 Malignant neoplasm of cervix uteri, unspecified: Secondary | ICD-10-CM | POA: Diagnosis not present

## 2021-05-22 LAB — RAD ONC ARIA SESSION SUMMARY
Course Elapsed Days: 39
Plan Fractions Treated to Date: 3
Plan Prescribed Dose Per Fraction: 1.8 Gy
Plan Total Fractions Prescribed: 5
Plan Total Prescribed Dose: 9 Gy
Reference Point Dosage Given to Date: 50.4 Gy
Reference Point Session Dosage Given: 1.8 Gy
Session Number: 28

## 2021-05-23 ENCOUNTER — Ambulatory Visit: Payer: Managed Care, Other (non HMO)

## 2021-05-23 ENCOUNTER — Inpatient Hospital Stay: Payer: Managed Care, Other (non HMO) | Admitting: Hematology and Oncology

## 2021-05-24 ENCOUNTER — Ambulatory Visit: Payer: Managed Care, Other (non HMO)

## 2021-05-25 ENCOUNTER — Ambulatory Visit: Payer: Managed Care, Other (non HMO)

## 2021-05-26 ENCOUNTER — Ambulatory Visit: Payer: Managed Care, Other (non HMO)

## 2021-05-29 ENCOUNTER — Other Ambulatory Visit: Payer: Self-pay

## 2021-05-29 ENCOUNTER — Other Ambulatory Visit (HOSPITAL_COMMUNITY): Payer: Self-pay

## 2021-05-29 ENCOUNTER — Ambulatory Visit
Admission: RE | Admit: 2021-05-29 | Discharge: 2021-05-29 | Disposition: A | Discharge status: Home / Self Care | Payer: Managed Care, Other (non HMO) | Source: Ambulatory Visit | Attending: Radiation Oncology | Admitting: Radiation Oncology

## 2021-05-29 DIAGNOSIS — C539 Malignant neoplasm of cervix uteri, unspecified: Secondary | ICD-10-CM | POA: Diagnosis not present

## 2021-05-29 LAB — RAD ONC ARIA SESSION SUMMARY
Course Elapsed Days: 46
Plan Fractions Treated to Date: 4
Plan Prescribed Dose Per Fraction: 1.8 Gy
Plan Total Fractions Prescribed: 5
Plan Total Prescribed Dose: 9 Gy
Reference Point Dosage Given to Date: 52.2 Gy
Reference Point Session Dosage Given: 1.8 Gy
Session Number: 29

## 2021-05-30 ENCOUNTER — Encounter: Payer: Self-pay | Admitting: Genetic Counselor

## 2021-05-30 ENCOUNTER — Other Ambulatory Visit: Payer: Self-pay

## 2021-05-30 ENCOUNTER — Ambulatory Visit
Admission: RE | Admit: 2021-05-30 | Discharge: 2021-05-30 | Disposition: A | Discharge status: Home / Self Care | Payer: Managed Care, Other (non HMO) | Source: Ambulatory Visit | Attending: Radiation Oncology | Admitting: Radiation Oncology

## 2021-05-30 ENCOUNTER — Ambulatory Visit: Payer: Self-pay | Admitting: Genetic Counselor

## 2021-05-30 ENCOUNTER — Telehealth: Payer: Self-pay | Admitting: Genetic Counselor

## 2021-05-30 DIAGNOSIS — Z8049 Family history of malignant neoplasm of other genital organs: Secondary | ICD-10-CM

## 2021-05-30 DIAGNOSIS — Z800 Family history of malignant neoplasm of digestive organs: Secondary | ICD-10-CM

## 2021-05-30 DIAGNOSIS — Z1379 Encounter for other screening for genetic and chromosomal anomalies: Secondary | ICD-10-CM | POA: Insufficient documentation

## 2021-05-30 DIAGNOSIS — C539 Malignant neoplasm of cervix uteri, unspecified: Secondary | ICD-10-CM | POA: Diagnosis not present

## 2021-05-30 DIAGNOSIS — Z803 Family history of malignant neoplasm of breast: Secondary | ICD-10-CM

## 2021-05-30 LAB — RAD ONC ARIA SESSION SUMMARY
Course Elapsed Days: 47
Plan Fractions Treated to Date: 5
Plan Prescribed Dose Per Fraction: 1.8 Gy
Plan Total Fractions Prescribed: 5
Plan Total Prescribed Dose: 9 Gy
Reference Point Dosage Given to Date: 54 Gy
Reference Point Session Dosage Given: 1.8 Gy
Session Number: 30

## 2021-05-30 NOTE — Progress Notes (Signed)
HPI:   Robin Steele was previously seen in the [facility] due to a family history of cancer and concerns regarding a hereditary predisposition to cancer. Please refer to our prior cancer genetics clinic note for more information regarding our discussion, assessment and recommendations, at the time. Robin Steele recent genetic test results were disclosed to her, as were recommendations warranted by these results. These results and recommendations are discussed in more detail below.  CANCER HISTORY:  Oncology History  Cervical adenocarcinoma ([place])  09/14/2020 Initial Diagnosis   The patient has a history of prior abnormal paps, did not require biopsies are procedures, normalized. Has previously been HPV+.  More recent history is as follows: Pap 08/2020: ASC-H, HR HPV positive Colposcopy 09/14/20: ECC negative, CIN3 on cervical biopsies   02/15/2021 Pathology Results   [phone]  Cone biopsy of cervix showed invasive adenocarcinoma, 2.1 cm in maximum dimension, 1.5 cm depth of invasion.  Endocervical and deep margins are involved.  High-grade squamous intraepithelial lesion with involvement of endocervical glands were present.  Endocervix curettage show detached fragment of adenocarcinoma. P16 is strongly positive.   03/09/2021 Initial Diagnosis   Cervical adenocarcinoma ([place])    03/24/2021 PET scan   1. Moderate activity in the cervix with maximum SUV of 5.6. Some of this could be postprocedural/inflammatory related to recent conization, but residual tumor is not excluded. No findings of distant metastatic spread or hypermetabolic adenopathy.   03/30/2021 Imaging   MRI pelvis Repeat imaging was performed with intersection again showing the tumor biased towards the anterior aspect of the cervix but tracking towards the RIGHT posterolateral cervix with stromal disruption at the level of the inferior posterolateral cervix on the RIGHT (image 19/40)   Contrasted imaging (image  20/44) this shows contrast enhancement which follows this pattern. Early extension into the RIGHT posterolateral parametrium at the lower margin of the cervix is demonstrated on both contrasted and precontrast T2 weighted imaging. The lesion involves RIGHT anterolateral 2/3 of the cervix with loss of endocervical distinction noted on image 19 of 40 across the entire interface between the mass and the cervix, cervical disruption of stroma noted RIGHT posterolateral aspect as described.   Ovaries are normal.   Final impression: Cervical neoplasm with extension into cervical stroma and disruption of cervical stroma most notably along the RIGHT posterolateral margin with early extension into the RIGHT parametrium.     03/31/2021 Cancer Staging   Staging form: Cervix Uteri, AJCC Version 9 - Clinical stage from 03/31/2021: Stage IIB (cT2b, cN0, cM0) - Signed by Heath Lark, MD on 03/31/2021 Stage prefix: Initial diagnosis    04/12/2021 - 05/10/2021 Chemotherapy   Patient is on Treatment Plan : Cervical Cisplatin q7d      04/12/2021 Procedure   Placement of single lumen port a cath via right internal jugular vein. The catheter tip lies at the cavo-atrial junction. A power injectable port a cath was placed and is ready for immediate use.   05/25/2021 Genetic Testing   Negative hereditary cancer genetic testing: no pathogenic variants detected in [facility] CustomNext-Cancer +RNAinsight Panel.  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 Report date is May 25, 2021.   The CustomNext-Cancer+RNAinsight panel offered by [facility] includes sequencing and rearrangement analysis for the following 47 genes:  APC, ATM, AXIN2, BARD1, BMPR1A, BRCA1, BRCA2, BRIP1, CDH1, CDK4, CDKN2A, CHEK2, DICER1, EPCAM, GREM1, HOXB13, MEN1, MLH1, MSH2, MSH3, MSH6, MUTYH, NBN, NF1, NF2, NTHL1, PALB2, PMS2, POLD1, POLE, PTEN, RAD51C, RAD51D,  RECQL, RET, SDHA, SDHAF2, SDHB, SDHC, SDHD, SMAD4, SMARCA4, STK11,  TP53, TSC1, TSC2, and VHL.  RNA data is routinely analyzed for use in variant interpretation for all genes.     FAMILY HISTORY:  We obtained a detailed, 4-generation family history.  Significant diagnoses are listed below: Family History  Problem Relation Age of Onset   Cancer Mother        maligant tumor on arm; dx 63s   Endometrial cancer Mother        dx 15s   Breast cancer Maternal Aunt        dx unknown age   Colon cancer Maternal Grandmother        dx unknown age   Uterine cancer Half-Sister        paternal half sister; dx before age 49   Skin cancer Half-Sister        ? melanoma; d. 1   Cervical cancer Other        mother's maternal half sister; dx after 82   Colon cancer Other        MGM's mother; dx unknown age   Prostate cancer Neg Hx    Ovarian cancer Neg Hx       Ms. Yamamoto is unaware of previous family history of genetic testing for hereditary cancer risks. Affected family members were unavailable for genetic testing at this point in time.  There is no reported Ashkenazi Jewish ancestry. There is no known consanguinity.  GENETIC TEST RESULTS:  The [facility] CustomNext-Cancer +RNAinsight Panel found no pathogenic mutations.   The CustomNext-Cancer+RNAinsight panel offered by [facility] includes sequencing and rearrangement analysis for the following 50 genes:  APC, ATM, BAP1,BARD1, BMPR1A, BRCA1, BRCA2, BRIP1, CDH1, CDK4, CDKN2A, CHEK2, DICER1, MEN1, MLH1, MSH2, MSH6, MUTYH, NBN, NF1, NTHL1, PALB2, PMS2, POT1, PTEN, RAD50, RAD51C, RAD51D, SDHA, SDHB, SDHC, SDHD, SMAD4, SMARCA4, STK11, TP53, TSC1,TSC2 and VHL (sequencing and deletion/duplication); AXIN2, CTNNA1, HOXB13, KIT, MITF, MSH3, PDGFRA, POLD1 and POLE (sequencing only); EPCAM and GREM1 (deletion/duplication only). RNA data is routinely analyzed for use in variant interpretation for allgenes.  The test report has been scanned into EPIC and is located under the Molecular Pathology section of the Results Review  tab.  A portion of the result report is included below for reference. Genetic testing reported out on May 25, 2021.      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  At this time, it is unknown if this variant is associated with an increased risk for cancer or if it is benign, but most uncertain variants are reclassified to benign. It should not be used to make medical management decisions. With time, we suspect the laboratory will determine the significance of this variant, if any. If the laboratory reclassifies this variant, we will attempt to contact Robin Steele to discuss it further.   Even though a pathogenic variant was not identified, possible explanations for the cancer in the family may include: There may be no hereditary risk for cancer in the family. The cancers in Robin Steele and/or her family may be sporadic/familial or due to other genetic and environmental factors. There may be a gene mutation in one of these genes that current testing methods cannot detect but that chance is small. There could be another gene that has not yet been discovered, or that we have not yet tested, that is responsible for the cancer diagnoses in the family.  It is also possible there  is a hereditary cause for the cancer in the family that Robin Steele did not inherit.  Therefore, it is important to remain in touch with cancer genetics in the future so that we can continue to offer Robin Steele the most up to date genetic testing.    ADDITIONAL GENETIC TESTING:  We discussed with Robin Steele that her genetic testing was fairly extensive.  If there are additional relevant genes identified to increase cancer risk that can be analyzed in the future, we would be happy to discuss and coordinate this testing at that time.     CANCER SCREENING RECOMMENDATIONS:  Robin Steele test result is considered negative (normal).  This means that we have not  identified a hereditary cause for her personal or family history of cancer at this time.   An individual's cancer risk and medical management are not determined by genetic test results alone. Overall cancer risk assessment incorporates additional factors, including personal medical history, family history, and any available genetic information that may result in a personalized plan for cancer prevention and surveillance. Therefore, it is recommended she continue to follow the cancer management and screening guidelines provided by her oncology and primary healthcare provider.  RECOMMENDATIONS FOR FAMILY MEMBERS:   Since she did not inherit a identifiable mutation in a cancer predisposition gene included on this panel, her children could not have inherited a known mutation from her in one of these genes. Individuals in this family might be at some increased risk of developing cancer, over the general population risk, due to the family history of cancer.  Individuals in the family should notify their providers of the family history of cancer.   First degree relatives of those with colon cancer should receive colonoscopies beginning at age 59, or 10 years prior to the earliest diagnosis of colon cancer in the family, and receive colonoscopies at least every 5 years, or as recommended by their gastroenterologist.   Other members of the family may still carry a pathogenic variant in one of these genes that Robin Steele did not inherit. Based on the family history, we recommend her maternal half sister, who was diagnosed with uterine cancer before age 58, have genetic counseling and testing. Robin Steele will let us know if we can be of any assistance in coordinating genetic counseling and/or testing for this family member.   We do not recommend familial testing for the NF1 variant of uncertain significance (VUS).  FOLLOW-UP:  Lastly, we discussed with Robin Steele that cancer genetics is a rapidly advancing field and  it is possible that new genetic tests will be appropriate for her and/or her family members in the future. We encouraged her to remain in contact with cancer genetics on an annual basis so we can update her personal and family histories and let her know of advances in cancer genetics that may benefit this family.   Our contact number was provided. Robin Steele's questions were answered to her satisfaction, and she knows she is welcome to call us at anytime with additional questions or concerns.   Robin Steele M. Joette Catching, [place], [facility] Genetic Counselor [email or web address] (P) [phone]

## 2021-05-30 NOTE — Telephone Encounter (Signed)
Contacted patient in attempt to disclose results of genetic testing.  LVM with contact information requesting a call back.  

## 2021-05-30 NOTE — Telephone Encounter (Signed)
Revealed negative genetic testing and VUS in NF1.  Discussed that we do not know why there is cancer in the family. It could be sporadic/famillial, due to a change in a gene that she did not inherit, due to a different gene that we are not testing, or maybe our current technology may not be able to pick something up.  It will be important for her to keep in contact with genetics to keep up with whether additional testing may be needed.

## 2021-05-30 NOTE — H&P (View-Only) (Signed)
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 female who has been undergoing external beam and radiosensitizing chemotherapy as initial treatment for her stage IIb adenocarcinoma of the cervix.  She completed this therapy on May 23.  Patient is now ready to proceed with brachytherapy to complete her definitive course of radiation treatment.  The patient has a history of abnormal Pap smears, none of which required biopsies or procedures in the past until a Pap performed in August of 2022 showing HPV positive ASCUS. Subsequently, the patient underwent a colposcopy at [facility] on 09/14/20. 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   Upon further discussion with Dr. Addison Bailey at [facility], the patient agreed to proceed with CKC on 02/15/21. Pathology from the procedure revealed grade 2 invasive adenocarcinoma (p16 strongly positive) measuring 2.1 cm in the max diameter, with the endocervical and deep margins involved. Path also showed AIS and HGSIL with involvement of endocervical glands. Endocervical curettage also showed a detatched fragment of adenocarcinoma.   Subsequently, the patient was referred to Dr. Berline Lopes on 03/09/21 for further management. During this visit, the patient reported being on birth control pills since the age of 25, and that her menses became much lighter and less frequent several years ago. Around 1 year ago, she stated that she had her hormone levels tested which showed signs of menopause, prompting her to stop oral contraception around that  time. The patient was also noted to report light pink discharge following intercourse. Since her CKC, the patient also reported some vaginal bleeding, but denied any pelvic pain. Physical exam performed during this visit revealed ongoing healing consistent with recent CKC, and complete loss of tissue plane between the posterior cervix and vagina. No visible lesions were noted. Further work-up was discussed with the patient including PET to rule out metastatic disease.   Subsequent PET on 03/23/21 showed moderate activity in the cervix with maximum SUV of 5.6. Activity appreciated was noted as possibly postprocedural/inflammatory, related to recent conization, but residual tumor could not be excluded. Otherwise, PER showed no evidence of distant metastatic spread or hypermetabolic adenopathy.   MRI of the pelvis on 03/27/21 demonstrated signs of cervical neoplasm along the anterior and right lateral cervix, with potential extension into the right parametrium disrupting the internal endocervical boundaries, and involvement of cervical stroma. No signs of adenopathy were appreciated in the pelvis. MRI of the pelvis was repeated on 03/30/21 again showing the cervical neoplasm extending into cervical stroma, with disruption of cervical stroma most notably along the right posterolateral margin with early extension into the right parametrium.  Given no evidence of metastatic disease, the patient was referred to Dr. Alvy Bimler on 03/31/21 to discuss systemic treatment options. Following discussion of the risks and benefits, the patient agreed to proceed with chemotherapy consisting of weekly cisplatin for x 5-6 doses, along with concurrent RT.   Of note: The patient underwent cervical spinal fusion on 11/18/20 under the care of Dr. Lorin Mercy. Patient also had her most recent screening mammogram on 03/22/21 which showed no evidence of malignancy in either breast.    PREVIOUS  RADIATION THERAPY: No  PAST MEDICAL  HISTORY:  Past Medical History:  Diagnosis Date   Anxiety    Arthritis 2011   in neck and back   Cancer ([place]) 2022   skin cancer, 2 areas removed per pt   Depression    Difficulty sleeping    Family history of breast cancer 05/08/2021   Family history of colon cancer 05/08/2021   Family history of uterine cancer 05/08/2021   Hyperlipidemia    Pre-diabetes 2022   borderline, hgba1c 5.7 in august 2022    PAST SURGICAL HISTORY: Past Surgical History:  Procedure Laterality Date   ANTERIOR CERVICAL DECOMP/DISCECTOMY FUSION N/A 11/18/2020   Procedure: CERVICAL FOUR -CERVICAL FIVE  ANTERIOR CERVICAL DISCECTOMY FUSION, ALLOGRAFT, PLATE;  Surgeon: Marybelle Killings, MD;  Location: [place];  Service: Orthopedics;  Laterality: N/A;   BASAL CELL CARCINOMA EXCISION  2022   pt has had areas removed in the past as well   CERVICAL CONE BIOPSY  02/15/2021   HERNIA REPAIR     pt states she had an umbilical hernia repair about 25 years ago (around 1997), unsure if repaired with mesh   IR IMAGING GUIDED PORT INSERTION  04/11/2021    FAMILY HISTORY:  Family History  Problem Relation Age of Onset   Cancer Mother        maligant tumor on arm; dx 44s   Endometrial cancer Mother        dx 78s   Breast cancer Maternal Aunt        dx unknown age   Colon cancer Maternal Grandmother        dx unknown age   Uterine cancer Half-Sister        paternal half sister; dx before age 55   Skin cancer Half-Sister        ? melanoma; d. 50   Cervical cancer Other        mother's maternal half sister; dx after 60   Colon cancer Other        MGM's mother; dx unknown age   Prostate cancer Neg Hx    Ovarian cancer Neg Hx     SOCIAL HISTORY:  Social History   Tobacco Use   Smoking status: Never   Smokeless tobacco: Never  Vaping Use   Vaping Use: Never used  Substance Use Topics   Alcohol use: Yes    Comment: very occasional, maybe once or twice a year (1 glass of wine)   Drug use: Never  Works full-time at  [employer].  She does work remotely at this time.  ALLERGIES: No Known Allergies  MEDICATIONS:  No current facility-administered medications for this encounter.   Current Outpatient Medications  Medication Sig Dispense Refill   Ascorbic Acid (VITAMIN C) 1000 MG tablet Take 1,000 mg by mouth at bedtime.     Cyanocobalamin (B-12) 5000 MCG CAPS Take 5,000 mcg by mouth at bedtime.     dexamethasone (DECADRON) 4 MG tablet Take 1 tablet (4 mg total) by mouth daily. 14 tablet 0   DULoxetine (CYMBALTA) 60 MG capsule Take by mouth.     EYSUVIS 0.25 % SUSP Apply to eye.     lidocaine-diphenhydrAMINE-alum & mag hydroxide-simeth-nystatin Swish and swallow 5 MLs by mouth 4 times a day as directed 240 mL 0   lidocaine-prilocaine (EMLA) cream Apply to affected area once (Patient not taking: Reported on 04/04/2021) 30 g 3   Magnesium Oxide 250 MG TABS Take 1 tablet by mouth every morning.  Multiple Vitamins-Minerals (CENTRUM SILVER 50+WOMEN) TABS Take 1 tablet by mouth at bedtime.     Omega-3 1000 MG CAPS Take 1,000 mg by mouth at bedtime.     ondansetron (ZOFRAN) 8 MG tablet Take 1 tablet by mouth 2  times daily as needed. Start on the third day after cisplatin chemotherapy. (Patient not taking: Reported on 04/04/2021) 30 tablet 1   prochlorperazine (COMPAZINE) 10 MG tablet Take 1 tablet  by mouth every 6  hours as needed (Nausea or vomiting). (Patient not taking: Reported on 04/04/2021) 30 tablet 1   Red Yeast Rice 600 MG TABS Take 1,200 mg by mouth at bedtime.     temazepam (RESTORIL) 30 MG capsule Take by mouth.     valACYclovir (VALTREX) 500 MG tablet Take 500 mg by mouth at bedtime.      REVIEW OF SYSTEMS:  A 10+ POINT REVIEW OF SYSTEMS WAS OBTAINED including neurology, dermatology, psychiatry, cardiac, respiratory, lymph, extremities, GI, GU, musculoskeletal, constitutional, reproductive, HEENT.  She did have some mild postcoital bleeding prior to diagnosis.  She had some mild vaginal bleeding after  her CKC.  She denies any pelvic pain abdominal bloating.   PHYSICAL EXAM:  General: Alert and oriented, in no acute distress HEENT: Head is normocephalic. Extraocular movements are intact.  Neck: Neck is supple, no palpable cervical or supraclavicular lymphadenopathy. Heart: Regular in rate and rhythm with no murmurs, rubs, or gallops. Chest: Clear to auscultation bilaterally, with no rhonchi, wheezes, or rales. Abdomen: Soft, nontender, nondistended, with no rigidity or guarding. Extremities: No cyanosis or edema. Lymphatics: see Neck Exam Skin: No concerning lesions. Musculoskeletal: symmetric strength and muscle tone throughout. Neurologic: Cranial nerves II through XII are grossly intact. No obvious focalities. Speech is fluent. Coordination is intact. Psychiatric: Judgment and insight are intact. Affect is appropriate.  On pelvic examination the external genitalia were unremarkable. A speculum exam was performed. There are no mucosal lesions noted in the vaginal vault.  Surgical changes noted along the cervical region.  On bimanual and rectovaginal examination there were no pelvic masses appreciated.  Cervix is essentially normal in size with palpation.   ECOG = 1  0 - Asymptomatic (Fully active, able to carry on all predisease activities without restriction)  1 - Symptomatic but completely ambulatory (Restricted in physically strenuous activity but ambulatory and able to carry out work of a light or sedentary nature. For example, light housework, office work)  2 - Symptomatic, <50% in bed during the day (Ambulatory and capable of all self care but unable to carry out any work activities. Up and about more than 50% of waking hours)  3 - Symptomatic, >50% in bed, but not bedbound (Capable of only limited self-care, confined to bed or chair 50% or more of waking hours)  4 - Bedbound (Completely disabled. Cannot carry on any self-care. Totally confined to bed or chair)  5 - Death    Eustace Pen MM, Creech RH, Tormey DC, et al. [phone]). "Toxicity and response criteria of the [facility]". [place] Oncol. 5 (6): 649-55  LABORATORY DATA:  Lab Results  Component Value Date   WBC 3.1 (L) 05/15/2021   HGB 10.9 (L) 05/15/2021   HCT 33.4 (L) 05/15/2021   MCV 82.3 05/15/2021   PLT 102 (L) 05/15/2021   NEUTROABS 2.8 05/15/2021   Lab Results  Component Value Date   NA 135 05/15/2021   K 4.1 05/15/2021   CL 103 05/15/2021   CO2 25 05/15/2021  GLUCOSE 202 (H) 05/15/2021   CREATININE 0.84 05/15/2021   CALCIUM 8.8 (L) 05/15/2021      RADIOGRAPHY: No results found.    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  As above the MRI shows right parametrial involvement and therefore she would not be a candidate for definitive treatment with a radical hysterectomy.  She has recently completed her radiosensitizing chemotherapy and external beam radiation therapy.  She tolerated this treatment reasonably well with expected side effects.  PLAN: She will be taken to the operating room at 730 am May 25 for exam under anesthesia followed by placement of tandem ring apparatus in preparation for high-dose-rate radiation therapy.  Patient will be treated with iridium 192.  She is to receive 4 subsequent high-dose-rate treatments to complete her definitive course of radiation therapy.    ------------------------------------------------  Blair Promise, PhD, MD  This document serves as a record of services personally performed by Gery Pray, MD. It was created on his behalf by Roney Mans, a trained medical scribe. The creation of this record is based on the scribe's personal observations and the provider's statements to them. This document has been checked and approved by the attending provider.

## 2021-05-30 NOTE — H&P (Signed)
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 female who has been undergoing external beam and radiosensitizing chemotherapy as initial treatment for her stage IIb adenocarcinoma of the cervix.  She completed this therapy on May 23.  Patient is now ready to proceed with brachytherapy to complete her definitive course of radiation treatment.  The patient has a history of abnormal Pap smears, none of which required biopsies or procedures in the past until a Pap performed in August of 2022 showing HPV positive ASCUS. Subsequently, the patient underwent a colposcopy at [facility] on 09/14/20. 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   Upon further discussion with Dr. Addison Bailey at [facility], the patient agreed to proceed with CKC on 02/15/21. Pathology from the procedure revealed grade 2 invasive adenocarcinoma (p16 strongly positive) measuring 2.1 cm in the max diameter, with the endocervical and deep margins involved. Path also showed AIS and HGSIL with involvement of endocervical glands. Endocervical curettage also showed a detatched fragment of adenocarcinoma.   Subsequently, the patient was referred to Dr. Berline Lopes on 03/09/21 for further management. During this visit, the patient reported being on birth control pills since the age of 56, and that her menses became much lighter and less frequent several years ago. Around 1 year ago, she stated that she had her hormone levels tested which showed signs of menopause, prompting her to stop oral contraception around that  time. The patient was also noted to report light pink discharge following intercourse. Since her CKC, the patient also reported some vaginal bleeding, but denied any pelvic pain. Physical exam performed during this visit revealed ongoing healing consistent with recent CKC, and complete loss of tissue plane between the posterior cervix and vagina. No visible lesions were noted. Further work-up was discussed with the patient including PET to rule out metastatic disease.   Subsequent PET on 03/23/21 showed moderate activity in the cervix with maximum SUV of 5.6. Activity appreciated was noted as possibly postprocedural/inflammatory, related to recent conization, but residual tumor could not be excluded. Otherwise, PER showed no evidence of distant metastatic spread or hypermetabolic adenopathy.   MRI of the pelvis on 03/27/21 demonstrated signs of cervical neoplasm along the anterior and right lateral cervix, with potential extension into the right parametrium disrupting the internal endocervical boundaries, and involvement of cervical stroma. No signs of adenopathy were appreciated in the pelvis. MRI of the pelvis was repeated on 03/30/21 again showing the cervical neoplasm extending into cervical stroma, with disruption of cervical stroma most notably along the right posterolateral margin with early extension into the right parametrium.  Given no evidence of metastatic disease, the patient was referred to Dr. Alvy Bimler on 03/31/21 to discuss systemic treatment options. Following discussion of the risks and benefits, the patient agreed to proceed with chemotherapy consisting of weekly cisplatin for x 5-6 doses, along with concurrent RT.   Of note: The patient underwent cervical spinal fusion on 11/18/20 under the care of Dr. Lorin Mercy. Patient also had her most recent screening mammogram on 03/22/21 which showed no evidence of malignancy in either breast.    PREVIOUS  RADIATION THERAPY: No  PAST MEDICAL  HISTORY:  Past Medical History:  Diagnosis Date   Anxiety    Arthritis 2011   in neck and back   Cancer ([place]) 2022   skin cancer, 2 areas removed per pt   Depression    Difficulty sleeping    Family history of breast cancer 05/08/2021   Family history of colon cancer 05/08/2021   Family history of uterine cancer 05/08/2021   Hyperlipidemia    Pre-diabetes 2022   borderline, hgba1c 5.7 in august 2022    PAST SURGICAL HISTORY: Past Surgical History:  Procedure Laterality Date   ANTERIOR CERVICAL DECOMP/DISCECTOMY FUSION N/A 11/18/2020   Procedure: CERVICAL FOUR -CERVICAL FIVE  ANTERIOR CERVICAL DISCECTOMY FUSION, ALLOGRAFT, PLATE;  Surgeon: Marybelle Killings, MD;  Location: [place];  Service: Orthopedics;  Laterality: N/A;   BASAL CELL CARCINOMA EXCISION  2022   pt has had areas removed in the past as well   CERVICAL CONE BIOPSY  02/15/2021   HERNIA REPAIR     pt states she had an umbilical hernia repair about 25 years ago (around 1997), unsure if repaired with mesh   IR IMAGING GUIDED PORT INSERTION  04/11/2021    FAMILY HISTORY:  Family History  Problem Relation Age of Onset   Cancer Mother        maligant tumor on arm; dx 42s   Endometrial cancer Mother        dx 26s   Breast cancer Maternal Aunt        dx unknown age   Colon cancer Maternal Grandmother        dx unknown age   Uterine cancer Half-Sister        paternal half sister; dx before age 74   Skin cancer Half-Sister        ? melanoma; d. 60   Cervical cancer Other        mother's maternal half sister; dx after 16   Colon cancer Other        MGM's mother; dx unknown age   Prostate cancer Neg Hx    Ovarian cancer Neg Hx     SOCIAL HISTORY:  Social History   Tobacco Use   Smoking status: Never   Smokeless tobacco: Never  Vaping Use   Vaping Use: Never used  Substance Use Topics   Alcohol use: Yes    Comment: very occasional, maybe once or twice a year (1 glass of wine)   Drug use: Never  Works full-time at  [employer].  She does work remotely at this time.  ALLERGIES: No Known Allergies  MEDICATIONS:  No current facility-administered medications for this encounter.   Current Outpatient Medications  Medication Sig Dispense Refill   Ascorbic Acid (VITAMIN C) 1000 MG tablet Take 1,000 mg by mouth at bedtime.     Cyanocobalamin (B-12) 5000 MCG CAPS Take 5,000 mcg by mouth at bedtime.     dexamethasone (DECADRON) 4 MG tablet Take 1 tablet (4 mg total) by mouth daily. 14 tablet 0   DULoxetine (CYMBALTA) 60 MG capsule Take by mouth.     EYSUVIS 0.25 % SUSP Apply to eye.     lidocaine-diphenhydrAMINE-alum & mag hydroxide-simeth-nystatin Swish and swallow 5 MLs by mouth 4 times a day as directed 240 mL 0   lidocaine-prilocaine (EMLA) cream Apply to affected area once (Patient not taking: Reported on 04/04/2021) 30 g 3   Magnesium Oxide 250 MG TABS Take 1 tablet by mouth every morning.  Multiple Vitamins-Minerals (CENTRUM SILVER 50+WOMEN) TABS Take 1 tablet by mouth at bedtime.     Omega-3 1000 MG CAPS Take 1,000 mg by mouth at bedtime.     ondansetron (ZOFRAN) 8 MG tablet Take 1 tablet by mouth 2  times daily as needed. Start on the third day after cisplatin chemotherapy. (Patient not taking: Reported on 04/04/2021) 30 tablet 1   prochlorperazine (COMPAZINE) 10 MG tablet Take 1 tablet  by mouth every 6  hours as needed (Nausea or vomiting). (Patient not taking: Reported on 04/04/2021) 30 tablet 1   Red Yeast Rice 600 MG TABS Take 1,200 mg by mouth at bedtime.     temazepam (RESTORIL) 30 MG capsule Take by mouth.     valACYclovir (VALTREX) 500 MG tablet Take 500 mg by mouth at bedtime.      REVIEW OF SYSTEMS:  A 10+ POINT REVIEW OF SYSTEMS WAS OBTAINED including neurology, dermatology, psychiatry, cardiac, respiratory, lymph, extremities, GI, GU, musculoskeletal, constitutional, reproductive, HEENT.  She did have some mild postcoital bleeding prior to diagnosis.  She had some mild vaginal bleeding after  her CKC.  She denies any pelvic pain abdominal bloating.   PHYSICAL EXAM:  General: Alert and oriented, in no acute distress HEENT: Head is normocephalic. Extraocular movements are intact.  Neck: Neck is supple, no palpable cervical or supraclavicular lymphadenopathy. Heart: Regular in rate and rhythm with no murmurs, rubs, or gallops. Chest: Clear to auscultation bilaterally, with no rhonchi, wheezes, or rales. Abdomen: Soft, nontender, nondistended, with no rigidity or guarding. Extremities: No cyanosis or edema. Lymphatics: see Neck Exam Skin: No concerning lesions. Musculoskeletal: symmetric strength and muscle tone throughout. Neurologic: Cranial nerves II through XII are grossly intact. No obvious focalities. Speech is fluent. Coordination is intact. Psychiatric: Judgment and insight are intact. Affect is appropriate.  On pelvic examination the external genitalia were unremarkable. A speculum exam was performed. There are no mucosal lesions noted in the vaginal vault.  Surgical changes noted along the cervical region.  On bimanual and rectovaginal examination there were no pelvic masses appreciated.  Cervix is essentially normal in size with palpation.   ECOG = 1  0 - Asymptomatic (Fully active, able to carry on all predisease activities without restriction)  1 - Symptomatic but completely ambulatory (Restricted in physically strenuous activity but ambulatory and able to carry out work of a light or sedentary nature. For example, light housework, office work)  2 - Symptomatic, <50% in bed during the day (Ambulatory and capable of all self care but unable to carry out any work activities. Up and about more than 50% of waking hours)  3 - Symptomatic, >50% in bed, but not bedbound (Capable of only limited self-care, confined to bed or chair 50% or more of waking hours)  4 - Bedbound (Completely disabled. Cannot carry on any self-care. Totally confined to bed or chair)  5 - Death    Eustace Pen MM, Creech RH, Tormey DC, et al. [phone]). "Toxicity and response criteria of the [facility]". [place] Oncol. 5 (6): 649-55  LABORATORY DATA:  Lab Results  Component Value Date   WBC 3.1 (L) 05/15/2021   HGB 10.9 (L) 05/15/2021   HCT 33.4 (L) 05/15/2021   MCV 82.3 05/15/2021   PLT 102 (L) 05/15/2021   NEUTROABS 2.8 05/15/2021   Lab Results  Component Value Date   NA 135 05/15/2021   K 4.1 05/15/2021   CL 103 05/15/2021   CO2 25 05/15/2021  GLUCOSE 202 (H) 05/15/2021   CREATININE 0.84 05/15/2021   CALCIUM 8.8 (L) 05/15/2021      RADIOGRAPHY: No results found.    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  As above the MRI shows right parametrial involvement and therefore she would not be a candidate for definitive treatment with a radical hysterectomy.  She has recently completed her radiosensitizing chemotherapy and external beam radiation therapy.  She tolerated this treatment reasonably well with expected side effects.  PLAN: She will be taken to the operating room at 730 am May 25 for exam under anesthesia followed by placement of tandem ring apparatus in preparation for high-dose-rate radiation therapy.  Patient will be treated with iridium 192.  She is to receive 4 subsequent high-dose-rate treatments to complete her definitive course of radiation therapy.    ------------------------------------------------  Blair Promise, PhD, MD  This document serves as a record of services personally performed by Gery Pray, MD. It was created on his behalf by Roney Mans, a trained medical scribe. The creation of this record is based on the scribe's personal observations and the provider's statements to them. This document has been checked and approved by the attending provider.

## 2021-05-31 ENCOUNTER — Other Ambulatory Visit: Payer: Self-pay

## 2021-05-31 ENCOUNTER — Ambulatory Visit: Payer: Managed Care, Other (non HMO)

## 2021-05-31 ENCOUNTER — Encounter (HOSPITAL_BASED_OUTPATIENT_CLINIC_OR_DEPARTMENT_OTHER): Payer: Self-pay | Admitting: Radiation Oncology

## 2021-05-31 NOTE — Progress Notes (Signed)
  Radiation Oncology         [phone]) [phone] ________________________________  Name: Robin Steele MRN: [id number]  Date: 06/01/2021  DOB: Dec 04, 1965  CC: [name], MD  HDR BRACHYTHERAPY NOTE  DIAGNOSIS: The encounter diagnosis was Cervical adenocarcinoma [facility]).   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 Identity was confirmed. All relevant records and images related to the planned course of therapy were reviewed. The patient freely provided informed written consent to proceed with treatment after reviewing the details related to the planned course of therapy. The consent form was witnessed and verified by the simulation staff. Then, the patient was set-up in a stable reproducible supine position for radiation therapy. The tandem ring system was accessed and fiducial markers were placed within the tandem and ring.   Simple treatment device note: On the operating room the patient had construction of her custom tandem ring system. She will be treated with a 45 tandem/ring system. The patient had placement of a 40 mm tandem. A cervical ring with a small shielding was used for her treatment. A rectal paddle was also part of her custom set up device.  Verification simulation note: An AP and lateral film was obtained through the pelvis area. This was compared to the patient's planning films documenting accurate position of the tandem/ring system for treatment.  High-dose-rate brachytherapy treatment note:   The remote afterloading device was accessed through catheter system and attached to the tandem ring system. Patient then proceeded to undergo her first high-dose-rate treatment directed at the cervix. The  patient was prescribed a dose of 5.5 gray to be delivered to the [place].Marland Kitchen Patient was treated with 2 channels using 22 dwell positions. Treatment time was 448.3 seconds. The patient tolerated the procedure well. After completion of her therapy, a radiation survey was performed documenting return of the iridium source into the GammaMed safe. The patient was then transferred to the nursing suite.  She then had removal of the rectal paddle followed by the tandem and ring system. The patient tolerated the removal well.  PLAN: The patient will return in several days for her second high-dose-rate treatment high-dose-rate treatment.  ________________________________   -----------------------------------  Blair Promise, PhD, MD  This document serves as a record of services personally performed by Gery Pray, MD. It was created on his behalf by Roney Mans, a trained medical scribe. The creation of this record is based on the scribe's personal observations and the provider's statements to them. This document has been checked and approved by the attending provider.

## 2021-05-31 NOTE — Progress Notes (Signed)
Spoke w/ via phone for pre-op interview---pt  Lab needs dos---- none    Lab results------cbc with dif, bmet, magnesium done 05-15-2021 in epic COVID test -----patient states asymptomatic no test needed Arrive at -------530 am 06-01-2021 NPO after MN NO Solid Food.  Clear liquids from MN until---430 am Med rec completed Medications to take morning of surgery -----Dexamethasone prn, compazine prn, eye drop Diabetic medication -----n/a Patient instructed no nail polish to be worn day of surgery Patient instructed to bring photo id and insurance card day of surgery Patient aware to have Driver (ride ) / caregiver    pt driving self dos, driver home/caregiver boyfriend Robin Steele cell [phone] for 24 hours after surgery  Patient Special Instructions -----none Pre-Op special Istructions -----none Patient verbalized understanding of instructions that were given at this phone interview. Patient denies shortness of breath, chest pain, fever, cough at this phone interview.

## 2021-06-01 ENCOUNTER — Ambulatory Visit (HOSPITAL_BASED_OUTPATIENT_CLINIC_OR_DEPARTMENT_OTHER): Payer: Managed Care, Other (non HMO) | Admitting: Anesthesiology

## 2021-06-01 ENCOUNTER — Other Ambulatory Visit: Payer: Self-pay

## 2021-06-01 ENCOUNTER — Encounter (HOSPITAL_BASED_OUTPATIENT_CLINIC_OR_DEPARTMENT_OTHER): Payer: Self-pay | Admitting: Radiation Oncology

## 2021-06-01 ENCOUNTER — Ambulatory Visit (HOSPITAL_BASED_OUTPATIENT_CLINIC_OR_DEPARTMENT_OTHER)
Admission: RE | Admit: 2021-06-01 | Discharge: 2021-06-01 | Disposition: A | Discharge status: Home / Self Care | Payer: Managed Care, Other (non HMO) | Attending: Radiation Oncology | Admitting: Radiation Oncology

## 2021-06-01 ENCOUNTER — Ambulatory Visit
Admission: RE | Admit: 2021-06-01 | Discharge: 2021-06-01 | Disposition: A | Discharge status: Home / Self Care | Payer: Managed Care, Other (non HMO) | Source: Ambulatory Visit | Attending: Radiation Oncology | Admitting: Radiation Oncology

## 2021-06-01 ENCOUNTER — Ambulatory Visit (HOSPITAL_COMMUNITY)
Admission: RE | Admit: 2021-06-01 | Discharge: 2021-06-01 | Disposition: A | Discharge status: Home / Self Care | Payer: Managed Care, Other (non HMO) | Source: Ambulatory Visit | Attending: Radiation Oncology | Admitting: Radiation Oncology

## 2021-06-01 ENCOUNTER — Encounter (HOSPITAL_BASED_OUTPATIENT_CLINIC_OR_DEPARTMENT_OTHER)
Admission: RE | Disposition: A | Discharge status: Home / Self Care | Payer: Self-pay | Source: Home / Self Care | Attending: Radiation Oncology

## 2021-06-01 VITALS — BP 154/85 | HR 74 | Temp 98.1°F | Resp 18

## 2021-06-01 DIAGNOSIS — D539 Nutritional anemia, unspecified: Secondary | ICD-10-CM

## 2021-06-01 DIAGNOSIS — D630 Anemia in neoplastic disease: Secondary | ICD-10-CM | POA: Diagnosis not present

## 2021-06-01 DIAGNOSIS — Z01818 Encounter for other preprocedural examination: Secondary | ICD-10-CM

## 2021-06-01 DIAGNOSIS — C539 Malignant neoplasm of cervix uteri, unspecified: Secondary | ICD-10-CM

## 2021-06-01 DIAGNOSIS — F418 Other specified anxiety disorders: Secondary | ICD-10-CM | POA: Diagnosis not present

## 2021-06-01 DIAGNOSIS — Z9221 Personal history of antineoplastic chemotherapy: Secondary | ICD-10-CM | POA: Insufficient documentation

## 2021-06-01 HISTORY — DX: Personal history of COVID-19: Z86.16

## 2021-06-01 HISTORY — DX: Dry eye syndrome of bilateral lacrimal glands: H04.123

## 2021-06-01 HISTORY — PX: TANDEM RING INSERTION: SHX6199

## 2021-06-01 HISTORY — DX: Malignant neoplasm of cervix uteri, unspecified: C53.9

## 2021-06-01 HISTORY — PX: OPERATIVE ULTRASOUND: SHX5996

## 2021-06-01 LAB — RAD ONC ARIA SESSION SUMMARY
Course Elapsed Days: 49
Plan Fractions Treated to Date: 1
Plan Prescribed Dose Per Fraction: 5.5 Gy
Plan Total Fractions Prescribed: 1
Plan Total Prescribed Dose: 5.5 Gy
Reference Point Dosage Given to Date: 12.0731 Gy
Reference Point Dosage Given to Date: 12.1705 Gy
Reference Point Dosage Given to Date: 5.201 Gy
Reference Point Dosage Given to Date: 5.2246 Gy
Reference Point Dosage Given to Date: 5.5 Gy
Reference Point Session Dosage Given: 12.0731 Gy
Reference Point Session Dosage Given: 12.1705 Gy
Reference Point Session Dosage Given: 5.201 Gy
Reference Point Session Dosage Given: 5.2246 Gy
Reference Point Session Dosage Given: 5.5 Gy
Session Number: 31

## 2021-06-01 SURGERY — INSERTION, UTERINE TANDEM AND RING OR CYLINDER, FOR BRACHYTHERAPY
Anesthesia: General | Site: Vagina

## 2021-06-01 MED ORDER — EPHEDRINE SULFATE (PRESSORS) 50 MG/ML IJ SOLN
INTRAMUSCULAR | Status: DC | PRN
  Administered 2021-06-01 (×2): 10 mg via INTRAVENOUS

## 2021-06-01 MED ORDER — PROPOFOL 10 MG/ML IV BOLUS
INTRAVENOUS | Status: AC
Start: 2021-06-01 — End: ?
  Filled 2021-06-01: qty 20

## 2021-06-01 MED ORDER — DEXAMETHASONE SODIUM PHOSPHATE 4 MG/ML IJ SOLN
INTRAMUSCULAR | Status: DC | PRN
  Administered 2021-06-01: 10 mg via INTRAVENOUS

## 2021-06-01 MED ORDER — LIDOCAINE HCL (CARDIAC) PF 100 MG/5ML IV SOSY
PREFILLED_SYRINGE | INTRAVENOUS | Status: DC | PRN
Start: 2021-06-01 — End: 2021-06-01
  Administered 2021-06-01: 60 mg via INTRAVENOUS

## 2021-06-01 MED ORDER — LACTATED RINGERS IV SOLN: INTRAVENOUS | Status: DC

## 2021-06-01 MED ORDER — KETOROLAC TROMETHAMINE 30 MG/ML IJ SOLN
INTRAMUSCULAR | Status: DC | PRN
  Administered 2021-06-01: 30 mg via INTRAVENOUS

## 2021-06-01 MED ORDER — OXYCODONE HCL 5 MG PO TABS: 5.0000 mg | ORAL_TABLET | Freq: Once | ORAL | Status: DC | PRN

## 2021-06-01 MED ORDER — LIDOCAINE HCL (PF) 2 % IJ SOLN
INTRAMUSCULAR | Status: AC
  Filled 2021-06-01: qty 5

## 2021-06-01 MED ORDER — OXYCODONE HCL 5 MG/5ML PO SOLN: 5.0000 mg | Freq: Once | ORAL | Status: DC | PRN

## 2021-06-01 MED ORDER — MIDAZOLAM HCL 2 MG/2ML IJ SOLN
INTRAMUSCULAR | Status: AC
  Filled 2021-06-01: qty 2

## 2021-06-01 MED ORDER — MIDAZOLAM HCL 5 MG/5ML IJ SOLN
INTRAMUSCULAR | Status: DC | PRN
Start: 2021-06-01 — End: 2021-06-01
  Administered 2021-06-01: 2 mg via INTRAVENOUS

## 2021-06-01 MED ORDER — ONDANSETRON HCL 4 MG/2ML IJ SOLN
INTRAMUSCULAR | Status: DC | PRN
  Administered 2021-06-01: 4 mg via INTRAVENOUS

## 2021-06-01 MED ORDER — FENTANYL CITRATE (PF) 100 MCG/2ML IJ SOLN
INTRAMUSCULAR | Status: AC
  Filled 2021-06-01: qty 2

## 2021-06-01 MED ORDER — FENTANYL CITRATE (PF) 100 MCG/2ML IJ SOLN
INTRAMUSCULAR | Status: DC | PRN
  Administered 2021-06-01 (×4): 25 ug via INTRAVENOUS

## 2021-06-01 MED ORDER — ACETAMINOPHEN 500 MG PO TABS
ORAL_TABLET | ORAL | Status: AC
  Filled 2021-06-01: qty 2

## 2021-06-01 MED ORDER — ACETAMINOPHEN 500 MG PO TABS
1000.0000 mg | ORAL_TABLET | Freq: Once | ORAL | Status: AC
  Administered 2021-06-01: 1000 mg via ORAL

## 2021-06-01 MED ORDER — AMISULPRIDE (ANTIEMETIC) 5 MG/2ML IV SOLN: 10.0000 mg | Freq: Once | INTRAVENOUS | Status: DC | PRN

## 2021-06-01 MED ORDER — KETOROLAC TROMETHAMINE 30 MG/ML IJ SOLN
INTRAMUSCULAR | Status: AC
Start: 2021-06-01 — End: ?
  Filled 2021-06-01: qty 1

## 2021-06-01 MED ORDER — ONDANSETRON HCL 4 MG/2ML IJ SOLN: 4.0000 mg | Freq: Once | INTRAMUSCULAR | Status: DC | PRN

## 2021-06-01 MED ORDER — ONDANSETRON HCL 4 MG/2ML IJ SOLN
INTRAMUSCULAR | Status: AC
  Filled 2021-06-01: qty 2

## 2021-06-01 MED ORDER — PROPOFOL 10 MG/ML IV BOLUS
INTRAVENOUS | Status: AC
  Filled 2021-06-01: qty 20

## 2021-06-01 MED ORDER — FENTANYL CITRATE (PF) 100 MCG/2ML IJ SOLN: 25.0000 ug | INTRAMUSCULAR | Status: DC | PRN

## 2021-06-01 MED ORDER — LACTATED RINGERS IV SOLN
INTRAVENOUS | Status: DC
  Filled 2021-06-01 (×2): qty 250

## 2021-06-01 MED ORDER — 0.9 % SODIUM CHLORIDE (POUR BTL) OPTIME
TOPICAL | Status: DC | PRN
  Administered 2021-06-01: 500 mL

## 2021-06-01 MED ORDER — SODIUM CHLORIDE 0.9 % IR SOLN
Status: DC | PRN
Start: 2021-06-01 — End: 2021-06-01
  Administered 2021-06-01: 200 mL

## 2021-06-01 MED ORDER — HYDROMORPHONE HCL 1 MG/ML IJ SOLN
0.5000 mg | Freq: Once | INTRAMUSCULAR | Status: AC
  Administered 2021-06-01: 0.5 mg via INTRAVENOUS
  Filled 2021-06-01: qty 1

## 2021-06-01 MED ORDER — DEXAMETHASONE SODIUM PHOSPHATE 10 MG/ML IJ SOLN
INTRAMUSCULAR | Status: AC
  Filled 2021-06-01: qty 1

## 2021-06-01 MED ORDER — PROPOFOL 10 MG/ML IV BOLUS
INTRAVENOUS | Status: DC | PRN
Start: 2021-06-01 — End: 2021-06-01
  Administered 2021-06-01: 50 mg via INTRAVENOUS
  Administered 2021-06-01: 150 mg via INTRAVENOUS

## 2021-06-01 MED ORDER — EPHEDRINE 5 MG/ML INJ
INTRAVENOUS | Status: AC
  Filled 2021-06-01: qty 5

## 2021-06-01 MED ORDER — HYDROMORPHONE HCL 1 MG/ML IJ SOLN
INTRAMUSCULAR | Status: AC
  Filled 2021-06-01: qty 1

## 2021-06-01 SURGICAL SUPPLY — 22 items
DILATOR CANAL MILEX (MISCELLANEOUS) ×1 IMPLANT
DRSG PAD ABDOMINAL 8X10 ST (GAUZE/BANDAGES/DRESSINGS) ×4 IMPLANT
GLOVE BIO SURGEON STRL SZ 6 (GLOVE) ×2 IMPLANT
GLOVE BIO SURGEON STRL SZ7.5 (GLOVE) ×8 IMPLANT
GLOVE BIOGEL PI IND STRL 6 (GLOVE) IMPLANT
GLOVE BIOGEL PI IND STRL 7.0 (GLOVE) IMPLANT
GLOVE BIOGEL PI INDICATOR 6 (GLOVE) ×2
GLOVE BIOGEL PI INDICATOR 7.0 (GLOVE) ×2
GLOVE SURG POLYISO LF SZ7 (GLOVE) ×1 IMPLANT
GLOVE SURG POLYISO LF SZ7.5 (GLOVE) ×1 IMPLANT
GOWN STRL REUS W/TWL LRG LVL3 (GOWN DISPOSABLE) ×7 IMPLANT
GOWN STRL REUS W/TWL XL LVL3 (GOWN DISPOSABLE) ×1 IMPLANT
HOLDER FOLEY CATH W/STRAP (MISCELLANEOUS) ×4 IMPLANT
IV NS 1000ML (IV SOLUTION) ×3
IV NS 1000ML BAXH (IV SOLUTION) ×3 IMPLANT
IV SET EXTENSION GRAVITY 40 LF (IV SETS) ×4 IMPLANT
KIT TURNOVER CYSTO (KITS) ×4 IMPLANT
MAT PREVALON FULL STRYKER (MISCELLANEOUS) ×4 IMPLANT
NS IRRIG 500ML POUR BTL (IV SOLUTION) ×1 IMPLANT
PACK VAGINAL MINOR WOMEN LF (CUSTOM PROCEDURE TRAY) ×4 IMPLANT
TOWEL OR 17X26 10 PK STRL BLUE (TOWEL DISPOSABLE) ×4 IMPLANT
TRAY FOLEY W/BAG SLVR 14FR LF (SET/KITS/TRAYS/PACK) ×4 IMPLANT

## 2021-06-01 NOTE — Op Note (Signed)
06/01/2021  8:45 AM  PATIENT:  Robin Steele  56 y.o. female  PRE-OPERATIVE DIAGNOSIS:  CERVICAL CANCER  POST-OPERATIVE DIAGNOSIS:  CERVICAL CANCER  PROCEDURE:  Procedure(s): TANDEM RING INSERTION (N/A) OPERATIVE ULTRASOUND (N/A)  SURGEON:  Surgeon(s) and Role:    * Gery Pray, MD - Primary  PHYSICIAN ASSISTANT: Al Decant, MD, radiation oncology resident at [place]: none   ANESTHESIA:   general  EBL:  2 mL   BLOOD ADMINISTERED:none  DRAINS: Urinary Catheter (Foley)   LOCAL MEDICATIONS USED:  NONE  SPECIMEN:  No Specimen  DISPOSITION OF SPECIMEN:  N/A  COUNTS:  YES  TOURNIQUET:  * No tourniquets in log *  DICTATION: Patient was taken to the [facility] operating room #4.  Patient was prepped and draped in the usual sterile fashion and placed in the dorsolithotomy position.  Timeout for the procedure as well as preoperative medications and allergies was performed.  A Foley catheter was inserted and backfilled with approximately 200 cc of sterile water for ultrasound imaging purposes.  Patient then proceeded to undergo exam under anesthesia.  The cervical os was flush with the upper vaginal vault.  No fornices were noted.  There was some bandlike scar tissue along the posterior portion of the upper vaginal area leading into the cervical os.  On bimanual and rectovaginal examination the cervical mass appeared to be normal in size.  There is no parametrial extension noted.  Some erythema was noted to the anterior lip of the cervical area.  She then underwent dilation of the cervical os.  This was somewhat difficult due to stenotic cervical os.  An os dilator was used with ultrasound guidance.  The patient then underwent sounding of the uterus and was estimated to be approximately 6 cm in length.  The uterus was anteverted.  On ultrasound there was no bulky cervical mass appreciated.  Patient proceeded to undergo dilation of the  cervical os.  Initially placement of a 60 mm, 45 degree tandem with a cervical sleeve in place was placed however this was too long based on the patient's anatomy and this was switched out to a 40 mm 45 degree tandem with cervical sleeve in place.  She  then had a cervical ring with a small shielding Attached to the tandem.  This was then followed by placement of a rectal paddle posteriorly to limit dose to the rectal mucosa with her high-dose-rate treatment.  She tolerated the procedure well.  Later in the day the patient will be brought down to radiation oncology for planning and her first high-dose-rate treatment.  She is to receive 5.5 Pearline Cables to the high risk clinical target volume.  Iridium 192 will be the high-dose-rate source.  Plan is for the patient to receive 4 additional high-dose-rate treatments to complete her definitive course of radiation therapy.  PLAN OF CARE:  Transfer to radiation oncology for planning and treatment  PATIENT DISPOSITION:  PACU - hemodynamically stable.   Delay start of Pharmacological VTE agent (>24hrs) due to surgical blood loss or risk of bleeding: not applicable

## 2021-06-01 NOTE — Anesthesia Preprocedure Evaluation (Signed)
Anesthesia Evaluation  Patient identified by MRN, date of birth, ID band Patient awake    Reviewed: Allergy & Precautions, NPO status , Patient's Chart, lab work & pertinent test results  History of Anesthesia Complications Negative for: history of anesthetic complications  Airway Mallampati: I  TM Distance: >3 FB Neck ROM: Full    Dental  (+) Dental Advisory Given, Teeth Intact   Pulmonary neg pulmonary ROS,    Pulmonary exam normal        Cardiovascular negative cardio ROS Normal cardiovascular exam     Neuro/Psych Anxiety Depression    GI/Hepatic negative GI ROS, Neg liver ROS,   Endo/Other  diabetes (borderline)  Renal/GU negative Renal ROS  negative genitourinary   Musculoskeletal negative musculoskeletal ROS (+)   Abdominal   Peds  Hematology  (+) Blood dyscrasia, anemia , Pancytopenia Hgb 10.9, plts 102, WBC 3.1   Anesthesia Other Findings   Reproductive/Obstetrics Cervical Cancer                            Anesthesia Physical Anesthesia Plan  ASA: 3  Anesthesia Plan: General   Post-op Pain Management: Tylenol PO (pre-op)* and Toradol IV (intra-op)*   Induction: Intravenous  PONV Risk Score and Plan: 3 and Ondansetron, Dexamethasone, Midazolam and Treatment may vary due to age or medical condition  Airway Management Planned: LMA  Additional Equipment: None  Intra-op Plan:   Post-operative Plan: Extubation in OR  Informed Consent: I have reviewed the patients History and Physical, chart, labs and discussed the procedure including the risks, benefits and alternatives for the proposed anesthesia with the patient or authorized representative who has indicated his/her understanding and acceptance.     Dental advisory given  Plan Discussed with:   Anesthesia Plan Comments:         Anesthesia Quick Evaluation

## 2021-06-01 NOTE — Interval H&P Note (Signed)
History and Physical Interval Note:  06/01/2021 7:23 AM  Robin Steele  has presented today for surgery, with the diagnosis of CERVICAL CANCER.  The various methods of treatment have been discussed with the patient and family. After consideration of risks, benefits and other options for treatment, the patient has consented to  Procedure(s): TANDEM RING INSERTION (N/A) OPERATIVE ULTRASOUND (N/A) as a surgical intervention.  The patient's history has been reviewed, patient examined, no change in status, stable for surgery.  I have reviewed the patient's chart and labs.  Questions were answered to the patient's satisfaction.     Gery Pray

## 2021-06-01 NOTE — Patient Instructions (Signed)
IMMEDIATELY FOLLOWING SURGERY: Do not drive or operate machinery for the first twenty four hours after surgery. Do not make any important decisions for twenty four hours after surgery or while taking narcotic pain medications or sedatives. If you develop intractable nausea and vomiting or a severe headache please notify your doctor immediately.   FOLLOW-UP: You do not need to follow up with anesthesia unless specifically instructed to do so.   WOUND CARE INSTRUCTIONS (if applicable): Expect some mild vaginal bleeding, but if large amount of bleeding occurs please contact Dr. Sondra Come at [phone] or the Radiation On-Call physician. Call for any fever greater than 101.0 degrees or increasing vaginal//abdominal pain or trouble urinating.   QUESTIONS?: Please feel free to call your physician or the hospital operator if you have any questions, and they will be happy to assist you. Resume all medications: as listed on your after visit summary. Your next appointment is:  Future Appointments  Date Time Provider [place]  06/19/2021  9:00 AM WL-US 1 WL-US [place]  06/19/2021 11:00 AM Gery Pray, MD [facility] None  06/19/2021  3:00 PM Gery Pray, MD [facility] None  07/24/2021 12:00 PM [place] [facility] None  07/24/2021 12:40 PM Heath Lark, MD [facility] None

## 2021-06-01 NOTE — Anesthesia Postprocedure Evaluation (Signed)
Anesthesia Post Note  Patient: Robin Steele  Procedure(s) Performed: TANDEM RING INSERTION (Vagina ) OPERATIVE ULTRASOUND (Abdomen)     Patient location during evaluation: PACU Anesthesia Type: General Level of consciousness: awake and alert Pain management: pain level controlled Vital Signs Assessment: post-procedure vital signs reviewed and stable Respiratory status: spontaneous breathing, nonlabored ventilation and respiratory function stable Cardiovascular status: blood pressure returned to baseline and stable Postop Assessment: no apparent nausea or vomiting Anesthetic complications: no   No notable events documented.  Last Vitals:  Vitals:   06/01/21 0900 06/01/21 0915  BP: (!) 154/87 (!) 157/86  Pulse: 83 82  Resp: 17 15  Temp:  36.6 C  SpO2: 98% 97%    Last Pain:  Vitals:   06/01/21 0900  TempSrc:   PainSc: 0-No pain                 Lidia Collum

## 2021-06-01 NOTE — Transfer of Care (Signed)
Immediate Anesthesia Transfer of Care Note  Patient: Robin Steele  Procedure(s) Performed: Procedure(s) (LRB): TANDEM RING INSERTION (N/A) OPERATIVE ULTRASOUND (N/A)  Patient Location: PACU  Anesthesia Type: General  Level of Consciousness: awake, sedated, patient cooperative and responds to stimulation  Airway & Oxygen Therapy: Patient Spontanous Breathing and Patient connected to [place] 02   Post-op Assessment: Report given to PACU RN, Post -op Vital signs reviewed and stable and Patient moving all extremities  Post vital signs: Reviewed and stable  Complications: No apparent anesthesia complications

## 2021-06-01 NOTE — Anesthesia Procedure Notes (Signed)
Procedure Name: LMA Insertion Date/Time: 06/01/2021 7:41 AM Performed by: Justice Rocher, CRNA Pre-anesthesia Checklist: Patient identified, Emergency Drugs available, Suction available, Patient being monitored and Timeout performed Patient Re-evaluated:Patient Re-evaluated prior to induction Oxygen Delivery Method: Circle system utilized Preoxygenation: Pre-oxygenation with 100% oxygen Induction Type: IV induction Ventilation: Mask ventilation without difficulty LMA: LMA inserted LMA Size: 4.0 Number of attempts: 1 Airway Equipment and Method: Bite block Placement Confirmation: positive ETCO2, breath sounds checked- equal and bilateral and CO2 detector Tube secured with: Tape Dental Injury: Teeth and Oropharynx as per pre-operative assessment

## 2021-06-02 ENCOUNTER — Encounter (HOSPITAL_BASED_OUTPATIENT_CLINIC_OR_DEPARTMENT_OTHER): Payer: Self-pay | Admitting: Radiation Oncology

## 2021-06-08 ENCOUNTER — Encounter (HOSPITAL_BASED_OUTPATIENT_CLINIC_OR_DEPARTMENT_OTHER): Payer: Self-pay | Admitting: Radiation Oncology

## 2021-06-16 ENCOUNTER — Other Ambulatory Visit (HOSPITAL_COMMUNITY): Payer: Self-pay | Admitting: Radiation Oncology

## 2021-06-16 DIAGNOSIS — C539 Malignant neoplasm of cervix uteri, unspecified: Secondary | ICD-10-CM

## 2021-06-16 NOTE — Progress Notes (Signed)
  Radiation Oncology         [phone] ________________________________  Name: Robin Steele MRN: [id number]  Date: 06/19/2021  DOB: [birth year]  CC: [name], MD  HDR BRACHYTHERAPY NOTE  DIAGNOSIS: The encounter diagnosis was Cervical adenocarcinoma [facility]).   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 Identity was confirmed. All relevant records and images related to the planned course of therapy were reviewed. The patient freely provided informed written consent to proceed with treatment after reviewing the details related to the planned course of therapy. The consent form was witnessed and verified by the simulation staff. Then, the patient was set-up in a stable reproducible supine position for radiation therapy. The tandem ring system was accessed and fiducial markers were placed within the tandem and ring.   Simple treatment device note: On the operating room the patient had construction of her custom tandem ring system. She will be treated with a 45 tandem/ring system. The patient had placement of a 40 mm tandem. A cervical ring with a small shielding was used for her treatment. A rectal paddle was also part of her custom set up device.  Verification simulation note: An AP and lateral film was obtained through the pelvis area. This was compared to the patient's planning films documenting accurate position of the tandem/ring system for treatment.  High-dose-rate brachytherapy treatment note:   The remote afterloading device was accessed through catheter system and attached to the tandem ring system. Patient then proceeded to undergo her second high-dose-rate treatment directed at the cervix. The  patient was prescribed a dose of 5.5 gray to be delivered to the [place].Marland Kitchen Patient was treated with 2 channels using 22 dwell positions. Treatment time was 525.6 seconds. The patient tolerated the procedure well. After completion of her therapy, a radiation survey was performed documenting return of the iridium source into the GammaMed safe. The patient was then transferred to the nursing suite.  She then had removal of the rectal paddle followed by the tandem and ring system. The patient tolerated the removal well.  PLAN: The patient will return next week for her third high-dose-rate treatment. ________________________________   -----------------------------------  Blair Promise, PhD, MD  This document serves as a record of services personally performed by Gery Pray, MD. It was created on his behalf by Roney Mans, a trained medical scribe. The creation of this record is based on the scribe's personal observations and the provider's statements to them. This document has been checked and approved by the attending provider.

## 2021-06-19 ENCOUNTER — Ambulatory Visit
Admission: RE | Admit: 2021-06-19 | Discharge: 2021-06-19 | Disposition: A | Discharge status: Home / Self Care | Payer: Managed Care, Other (non HMO) | Source: Ambulatory Visit | Attending: Radiation Oncology | Admitting: Radiation Oncology

## 2021-06-19 ENCOUNTER — Other Ambulatory Visit: Payer: Self-pay

## 2021-06-19 ENCOUNTER — Ambulatory Visit (HOSPITAL_BASED_OUTPATIENT_CLINIC_OR_DEPARTMENT_OTHER): Payer: Managed Care, Other (non HMO) | Admitting: Anesthesiology

## 2021-06-19 ENCOUNTER — Ambulatory Visit (HOSPITAL_COMMUNITY)
Admission: RE | Admit: 2021-06-19 | Discharge: 2021-06-19 | Disposition: A | Discharge status: Home / Self Care | Payer: Managed Care, Other (non HMO) | Source: Ambulatory Visit | Attending: Radiation Oncology | Admitting: Radiation Oncology

## 2021-06-19 ENCOUNTER — Ambulatory Visit (HOSPITAL_BASED_OUTPATIENT_CLINIC_OR_DEPARTMENT_OTHER)
Admission: RE | Admit: 2021-06-19 | Discharge: 2021-06-19 | Disposition: A | Discharge status: Still In Hospital | Payer: Managed Care, Other (non HMO) | Attending: Radiation Oncology | Admitting: Radiation Oncology

## 2021-06-19 ENCOUNTER — Encounter (HOSPITAL_BASED_OUTPATIENT_CLINIC_OR_DEPARTMENT_OTHER)
Admission: RE | Disposition: A | Discharge status: Still In Hospital | Payer: Self-pay | Source: Home / Self Care | Attending: Radiation Oncology

## 2021-06-19 ENCOUNTER — Encounter (HOSPITAL_BASED_OUTPATIENT_CLINIC_OR_DEPARTMENT_OTHER): Payer: Self-pay | Admitting: Radiation Oncology

## 2021-06-19 VITALS — BP 139/84 | HR 88

## 2021-06-19 DIAGNOSIS — C539 Malignant neoplasm of cervix uteri, unspecified: Secondary | ICD-10-CM

## 2021-06-19 DIAGNOSIS — Z01818 Encounter for other preprocedural examination: Secondary | ICD-10-CM

## 2021-06-19 HISTORY — PX: TANDEM RING INSERTION: SHX6199

## 2021-06-19 HISTORY — PX: OPERATIVE ULTRASOUND: SHX5996

## 2021-06-19 LAB — RAD ONC ARIA SESSION SUMMARY
Course Elapsed Days: 67
Plan Fractions Treated to Date: 1
Plan Prescribed Dose Per Fraction: 5.5 Gy
Plan Total Fractions Prescribed: 1
Plan Total Prescribed Dose: 5.5 Gy
Reference Point Dosage Given to Date: 10.0907 Gy
Reference Point Dosage Given to Date: 10.3778 Gy
Reference Point Dosage Given to Date: 11 Gy
Reference Point Dosage Given to Date: 23.4227 Gy
Reference Point Dosage Given to Date: 23.7616 Gy
Reference Point Session Dosage Given: 11.3496 Gy
Reference Point Session Dosage Given: 11.5911 Gy
Reference Point Session Dosage Given: 4.8897 Gy
Reference Point Session Dosage Given: 5.1532 Gy
Reference Point Session Dosage Given: 5.5 Gy
Session Number: 32

## 2021-06-19 LAB — GLUCOSE, CAPILLARY: Glucose-Capillary: 89 mg/dL (ref 70–99)

## 2021-06-19 SURGERY — INSERTION, UTERINE TANDEM AND RING OR CYLINDER, FOR BRACHYTHERAPY
Anesthesia: General | Site: Vagina

## 2021-06-19 MED ORDER — SODIUM CHLORIDE 0.9 % IR SOLN
Status: DC | PRN
  Administered 2021-06-19: 200 mL

## 2021-06-19 MED ORDER — KETOROLAC TROMETHAMINE 30 MG/ML IJ SOLN
INTRAMUSCULAR | Status: AC
  Filled 2021-06-19: qty 1

## 2021-06-19 MED ORDER — ONDANSETRON HCL 4 MG/2ML IJ SOLN
INTRAMUSCULAR | Status: DC | PRN
  Administered 2021-06-19: 4 mg via INTRAVENOUS

## 2021-06-19 MED ORDER — LACTATED RINGERS IV SOLN: INTRAVENOUS | Status: DC

## 2021-06-19 MED ORDER — 0.9 % SODIUM CHLORIDE (POUR BTL) OPTIME
TOPICAL | Status: DC | PRN
  Administered 2021-06-19: 500 mL

## 2021-06-19 MED ORDER — MIDAZOLAM HCL 5 MG/5ML IJ SOLN
INTRAMUSCULAR | Status: DC | PRN
  Administered 2021-06-19: 2 mg via INTRAVENOUS

## 2021-06-19 MED ORDER — HYDROMORPHONE HCL 1 MG/ML IJ SOLN
INTRAMUSCULAR | Status: AC
  Filled 2021-06-19: qty 1

## 2021-06-19 MED ORDER — FENTANYL CITRATE (PF) 100 MCG/2ML IJ SOLN: 25.0000 ug | INTRAMUSCULAR | Status: DC | PRN

## 2021-06-19 MED ORDER — KETOROLAC TROMETHAMINE 30 MG/ML IJ SOLN: 30.0000 mg | Freq: Once | INTRAMUSCULAR | Status: DC | PRN

## 2021-06-19 MED ORDER — PHENYLEPHRINE 80 MCG/ML (10ML) SYRINGE FOR IV PUSH (FOR BLOOD PRESSURE SUPPORT)
PREFILLED_SYRINGE | INTRAVENOUS | Status: DC | PRN
  Administered 2021-06-19 (×2): 80 ug via INTRAVENOUS

## 2021-06-19 MED ORDER — ONDANSETRON HCL 4 MG/2ML IJ SOLN: 4.0000 mg | Freq: Once | INTRAMUSCULAR | Status: DC | PRN

## 2021-06-19 MED ORDER — DEXAMETHASONE SODIUM PHOSPHATE 10 MG/ML IJ SOLN
INTRAMUSCULAR | Status: AC
  Filled 2021-06-19: qty 1

## 2021-06-19 MED ORDER — DEXAMETHASONE SODIUM PHOSPHATE 10 MG/ML IJ SOLN
INTRAMUSCULAR | Status: DC | PRN
  Administered 2021-06-19: 10 mg via INTRAVENOUS

## 2021-06-19 MED ORDER — FENTANYL CITRATE (PF) 100 MCG/2ML IJ SOLN
INTRAMUSCULAR | Status: AC
  Filled 2021-06-19: qty 2

## 2021-06-19 MED ORDER — OXYCODONE HCL 5 MG/5ML PO SOLN: 5.0000 mg | Freq: Once | ORAL | Status: DC | PRN

## 2021-06-19 MED ORDER — GLYCOPYRROLATE PF 0.2 MG/ML IJ SOSY
PREFILLED_SYRINGE | INTRAMUSCULAR | Status: AC
  Filled 2021-06-19: qty 2

## 2021-06-19 MED ORDER — LIDOCAINE 2% (20 MG/ML) 5 ML SYRINGE
INTRAMUSCULAR | Status: DC | PRN
  Administered 2021-06-19: 100 mg via INTRAVENOUS

## 2021-06-19 MED ORDER — LACTATED RINGERS IV SOLN
INTRAVENOUS | Status: DC
  Filled 2021-06-19 (×2): qty 250

## 2021-06-19 MED ORDER — PHENYLEPHRINE 80 MCG/ML (10ML) SYRINGE FOR IV PUSH (FOR BLOOD PRESSURE SUPPORT)
PREFILLED_SYRINGE | INTRAVENOUS | Status: AC
  Filled 2021-06-19: qty 10

## 2021-06-19 MED ORDER — KETOROLAC TROMETHAMINE 30 MG/ML IJ SOLN
INTRAMUSCULAR | Status: DC | PRN
  Administered 2021-06-19: 30 mg via INTRAVENOUS

## 2021-06-19 MED ORDER — FENTANYL CITRATE (PF) 100 MCG/2ML IJ SOLN
INTRAMUSCULAR | Status: DC | PRN
  Administered 2021-06-19: 50 ug via INTRAVENOUS

## 2021-06-19 MED ORDER — MIDAZOLAM HCL 2 MG/2ML IJ SOLN
INTRAMUSCULAR | Status: AC
  Filled 2021-06-19: qty 2

## 2021-06-19 MED ORDER — LIDOCAINE HCL (PF) 2 % IJ SOLN
INTRAMUSCULAR | Status: AC
  Filled 2021-06-19: qty 5

## 2021-06-19 MED ORDER — ONDANSETRON HCL 4 MG/2ML IJ SOLN
INTRAMUSCULAR | Status: AC
  Filled 2021-06-19: qty 2

## 2021-06-19 MED ORDER — PROPOFOL 10 MG/ML IV BOLUS
INTRAVENOUS | Status: DC | PRN
  Administered 2021-06-19: 200 mg via INTRAVENOUS

## 2021-06-19 MED ORDER — HYDROMORPHONE HCL 1 MG/ML IJ SOLN
0.5000 mg | Freq: Once | INTRAMUSCULAR | Status: AC
  Administered 2021-06-19: 0.5 mg via INTRAVENOUS
  Filled 2021-06-19: qty 1

## 2021-06-19 MED ORDER — OXYCODONE HCL 5 MG PO TABS: 5.0000 mg | ORAL_TABLET | Freq: Once | ORAL | Status: DC | PRN

## 2021-06-19 MED ORDER — GLYCOPYRROLATE PF 0.2 MG/ML IJ SOSY
PREFILLED_SYRINGE | INTRAMUSCULAR | Status: DC | PRN
  Administered 2021-06-19: .2 mg via INTRAVENOUS

## 2021-06-19 MED ORDER — PROPOFOL 10 MG/ML IV BOLUS
INTRAVENOUS | Status: AC
  Filled 2021-06-19: qty 20

## 2021-06-19 SURGICAL SUPPLY — 20 items
BNDG CONFORM 2 STRL LF (GAUZE/BANDAGES/DRESSINGS) IMPLANT
DILATOR CANAL MILEX (MISCELLANEOUS) IMPLANT
DRSG PAD ABDOMINAL 8X10 ST (GAUZE/BANDAGES/DRESSINGS) ×4 IMPLANT
DRSG TELFA 3X8 NADH (GAUZE/BANDAGES/DRESSINGS) ×3 IMPLANT
GAUZE 4X4 16PLY ~~LOC~~+RFID DBL (SPONGE) ×7 IMPLANT
GLOVE BIO SURGEON STRL SZ7.5 (GLOVE) ×8 IMPLANT
GOWN STRL REUS W/TWL LRG LVL3 (GOWN DISPOSABLE) ×4 IMPLANT
HOLDER FOLEY CATH W/STRAP (MISCELLANEOUS) ×4 IMPLANT
IV NS 1000ML (IV SOLUTION) ×3
IV NS 1000ML BAXH (IV SOLUTION) ×3 IMPLANT
IV SET EXTENSION GRAVITY 40 LF (IV SETS) ×4 IMPLANT
KIT TURNOVER CYSTO (KITS) ×4 IMPLANT
MAT PREVALON FULL STRYKER (MISCELLANEOUS) ×4 IMPLANT
PACK VAGINAL MINOR WOMEN LF (CUSTOM PROCEDURE TRAY) ×4 IMPLANT
PACKING VAGINAL (PACKING) IMPLANT
PAD DRESSING TELFA 3X8 NADH (GAUZE/BANDAGES/DRESSINGS) ×3 IMPLANT
SOL PREP POV-IOD 4OZ 10% (MISCELLANEOUS) ×1 IMPLANT
TOWEL OR 17X26 10 PK STRL BLUE (TOWEL DISPOSABLE) ×4 IMPLANT
TRAY FOLEY W/BAG SLVR 14FR LF (SET/KITS/TRAYS/PACK) ×4 IMPLANT
WATER STERILE IRR 500ML POUR (IV SOLUTION) ×3 IMPLANT

## 2021-06-19 NOTE — Progress Notes (Signed)
IMMEDIATELY FOLLOWING SURGERY: Do not drive or operate machinery for the first twenty four hours after surgery. Do not make any important decisions for twenty four hours after surgery or while taking narcotic pain medications or sedatives. If you develop intractable nausea and vomiting or a severe headache please notify your doctor immediately.   FOLLOW-UP: You do not need to follow up with anesthesia unless specifically instructed to do so.   WOUND CARE INSTRUCTIONS (if applicable): Expect some mild vaginal bleeding, but if large amount of bleeding occurs please contact Dr. Sondra Come at [phone] or the Radiation On-Call physician. Call for any fever greater than 101.0 degrees or increasing vaginal//abdominal pain or trouble urinating.   QUESTIONS?: Please feel free to call your physician or the hospital operator if you have any questions, and they will be happy to assist you. Resume all medications: as listed on your after visit summary. Your next appointment is:  Future Appointments  Date Time Provider [place]  06/19/2021  3:00 PM Gery Pray, MD [facility] None  06/29/2021  7:00 AM WL-US 1 WL-US [place]  06/29/2021 11:00 AM Gery Pray, MD [facility] None  06/29/2021  3:00 PM Gery Pray, MD [facility] None  07/14/2021  9:00 AM WL-US 1 WL-US [place]  07/14/2021  1:00 PM Gery Pray, MD [facility] None  07/14/2021  3:00 PM Gery Pray, MD [facility] None  07/24/2021 12:00 PM [place] [facility] None  07/24/2021 12:40 PM Heath Lark, MD [facility] None

## 2021-06-19 NOTE — Anesthesia Postprocedure Evaluation (Signed)
Anesthesia Post Note  Patient: Robin Steele  Procedure(s) Performed: TANDEM RING INSERTION (Vagina ) OPERATIVE ULTRASOUND (Abdomen)     Patient location during evaluation: PACU Anesthesia Type: General Level of consciousness: awake and alert Pain management: pain level controlled Vital Signs Assessment: post-procedure vital signs reviewed and stable Respiratory status: spontaneous breathing, nonlabored ventilation, respiratory function stable and patient connected to nasal cannula oxygen Cardiovascular status: blood pressure returned to baseline and stable Postop Assessment: no apparent nausea or vomiting Anesthetic complications: no   No notable events documented.  Last Vitals:  Vitals:   06/19/21 1000 06/19/21 1015  BP: 113/71 127/78  Pulse: 87 86  Resp: 14 13  Temp:    SpO2: 96% 99%    Last Pain:  Vitals:   06/19/21 0955  TempSrc:   PainSc: 0-No pain                 Barnet Glasgow

## 2021-06-19 NOTE — Anesthesia Preprocedure Evaluation (Signed)
Anesthesia Evaluation  Patient identified by MRN, date of birth, ID band Patient awake  General Assessment Comment:Robin Steele is a 56 y.o. female who has been undergoing external beam and radiosensitizing chemotherapy as initial treatment for her stage IIb adenocarcinoma of the cervix.  She completed this therapy on May 23.  Patient is now ready to proceed with brachytherapy to complete her definitive course of radiation treatment.  Reviewed: Allergy & Precautions, NPO status , Patient's Chart, lab work & pertinent test results  Airway Mallampati: II  TM Distance: >3 FB Neck ROM: Full    Dental no notable dental hx.    Pulmonary neg pulmonary ROS,    Pulmonary exam normal breath sounds clear to auscultation       Cardiovascular negative cardio ROS Normal cardiovascular exam Rhythm:Regular Rate:Normal     Neuro/Psych negative neurological ROS  negative psych ROS   GI/Hepatic negative GI ROS, Neg liver ROS,   Endo/Other  negative endocrine ROS  Renal/GU negative Renal ROS  negative genitourinary   Musculoskeletal negative musculoskeletal ROS (+)   Abdominal   Peds negative pediatric ROS (+)  Hematology negative hematology ROS (+)   Anesthesia Other Findings   Reproductive/Obstetrics negative OB ROS                             Anesthesia Physical Anesthesia Plan  ASA: 2  Anesthesia Plan: General   Post-op Pain Management: Minimal or no pain anticipated   Induction: Intravenous  PONV Risk Score and Plan: 3 and Ondansetron, Dexamethasone, Midazolam and Treatment may vary due to age or medical condition  Airway Management Planned: LMA  Additional Equipment:   Intra-op Plan:   Post-operative Plan: Extubation in OR  Informed Consent: I have reviewed the patients History and Physical, chart, labs and discussed the procedure including the risks, benefits and alternatives for the  proposed anesthesia with the patient or authorized representative who has indicated his/her understanding and acceptance.     Dental advisory given  Plan Discussed with: CRNA and Surgeon  Anesthesia Plan Comments:         Anesthesia Quick Evaluation

## 2021-06-19 NOTE — Brief Op Note (Addendum)
06/19/2021  10:14 AM  PATIENT:  Robin Steele  56 y.o. female  PRE-OPERATIVE DIAGNOSIS:  CERVICAL  CANCER  POST-OPERATIVE DIAGNOSIS:  CERVICAL  CANCER  PROCEDURE:  Procedure(s): TANDEM RING INSERTION (N/A) OPERATIVE ULTRASOUND (N/A)  SURGEON:  Surgeon(s) and Role:    * Gery Pray, MD - Primary  PHYSICIAN ASSISTANT:   ASSISTANTS: none   ANESTHESIA:   general  EBL: 0 mL  BLOOD ADMINISTERED:none  DRAINS: Urinary Catheter (Foley)   LOCAL MEDICATIONS USED:  NONE  SPECIMEN:  No Specimen  DISPOSITION OF SPECIMEN:  N/A  COUNTS:  YES  TOURNIQUET:  * No tourniquets in log *  DICTATION: Patient was taken to the [facility] operating room #3.  Patient was prepped and draped in the usual sterile fashion and placed in the dorsolithotomy position.  Timeout for the procedure as well as preoperative medications and allergies was performed.  A Foley catheter was inserted and backfilled with approximately 200 cc of sterile water for ultrasound imaging purposes.  Patient then proceeded to undergo exam under anesthesia.  The cervical os was flush with the upper vaginal vault.  No fornices were noted.  There was some bandlike scar tissue along the posterior portion of the upper vaginal area leading into the cervical os.  On bimanual examination the cervical mass appeared to be normal in size.  There is no parametrial extension noted.  Some erythema was noted to the anterior lip of the cervical area.  The cervical sleeve placed during her first procedure remained in place.  the uterus was estimated to be approximately 6 cm in length.  The uterus was anteverted.  On ultrasound there was no bulky cervical mass appreciated.    She then had placement of  a 40 mm 45 degree tandem with cervical sleeve in place.  She  then had a 45 degree cervical ring with a small shielding Attached to the tandem.  This was then followed by placement of a rectal paddle posteriorly to limit dose to the  rectal mucosa with her high-dose-rate treatment.  She tolerated the procedure well.  Later in the day the patient will be brought down to radiation oncology for planning and her second high-dose-rate treatment.  She is to receive 5.5 Pearline Cables to the high risk clinical target volume.  Iridium 192 will be the high-dose-rate source.  Plan is for the patient to receive 3 additional high-dose-rate treatments to complete her definitive course of radiation therapy.  PLAN OF CARE: Transfer to radiation oncology for planning and treatment  PATIENT DISPOSITION:  PACU - hemodynamically stable.   Delay start of Pharmacological VTE agent (>24hrs) due to surgical blood loss or risk of bleeding: not applicable

## 2021-06-19 NOTE — Anesthesia Procedure Notes (Signed)
Procedure Name: LMA Insertion Date/Time: 06/19/2021 9:23 AM  Performed by: Rogers Blocker, CRNAPre-anesthesia Checklist: Patient identified, Emergency Drugs available, Suction available and Patient being monitored Patient Re-evaluated:Patient Re-evaluated prior to induction Oxygen Delivery Method: Circle System Utilized Preoxygenation: Pre-oxygenation with 100% oxygen Induction Type: IV induction Ventilation: Mask ventilation without difficulty LMA: LMA inserted LMA Size: 4.0 Number of attempts: 1 Placement Confirmation: positive ETCO2 Tube secured with: Tape Dental Injury: Teeth and Oropharynx as per pre-operative assessment

## 2021-06-19 NOTE — Transfer of Care (Signed)
Immediate Anesthesia Transfer of Care Note  Patient: Robin Steele  Procedure(s) Performed: TANDEM RING INSERTION (Vagina ) OPERATIVE ULTRASOUND (Abdomen)  Patient Location: PACU  Anesthesia Type:General  Level of Consciousness: awake, alert , oriented and patient cooperative  Airway & Oxygen Therapy: Patient Spontanous Breathing  Post-op Assessment: Report given to RN and Post -op Vital signs reviewed and stable  Post vital signs: Reviewed and stable  Last Vitals:  Vitals Value Taken Time  BP 113/71 06/19/21 1000  Temp 36.9 C 06/19/21 0955  Pulse 90 06/19/21 1001  Resp 14 06/19/21 1001  SpO2 96 % 06/19/21 1001  Vitals shown include unvalidated device data.  Last Pain:  Vitals:   06/19/21 0755  TempSrc: Oral  PainSc: 0-No pain      Patients Stated Pain Goal: 5 (56/15/37 9432)  Complications: No notable events documented.

## 2021-06-19 NOTE — Interval H&P Note (Signed)
History and Physical Interval Note:  06/19/2021 9:16 AM  Robin Steele  has presented today for surgery, with the diagnosis of CERVICAL  CANCER.  The various methods of treatment have been discussed with the patient and family. After consideration of risks, benefits and other options for treatment, the patient has consented to  Procedure(s): TANDEM RING INSERTION (N/A) OPERATIVE ULTRASOUND (N/A) as a surgical intervention.  The patient's history has been reviewed, patient examined, no change in status, stable for surgery.  I have reviewed the patient's chart and labs.  Questions were answered to the patient's satisfaction.     Gery Pray

## 2021-06-20 ENCOUNTER — Encounter (HOSPITAL_BASED_OUTPATIENT_CLINIC_OR_DEPARTMENT_OTHER): Payer: Self-pay | Admitting: Radiation Oncology

## 2021-06-22 ENCOUNTER — Other Ambulatory Visit: Payer: Self-pay

## 2021-06-22 ENCOUNTER — Encounter (HOSPITAL_BASED_OUTPATIENT_CLINIC_OR_DEPARTMENT_OTHER): Payer: Self-pay | Admitting: Radiation Oncology

## 2021-06-22 NOTE — Progress Notes (Signed)
Spoke w/ via phone for pre-op interview---pt Lab needs dos----      none         Lab results------none COVID test -----patient states asymptomatic no test needed Arrive at -------530 am 06-29-2021 NPO after MN NO Solid Food.  Clear liquids from MN until---430 am Med rec completed Medications to take morning of surgery -----dexamethasone prn, compazine prn Diabetic medication -----n/a Patient instructed no nail polish to be worn day of surgery Patient instructed to bring photo id and insurance card day of surgery Patient aware to have Driver (ride ) / caregiver     son driver here, boyfriend driver home for 24 hours after surgery  Patient Special Instructions -----none Pre-Op special Istructions -----none Patient verbalized understanding of instructions that were given at this phone interview. Patient denies shortness of breath, chest pain, fever, cough at this phone interview.

## 2021-06-22 NOTE — H&P (View-Only) (Signed)
Spoke w/ via phone for pre-op interview---pt Lab needs dos----      none         Lab results------none COVID test -----patient states asymptomatic no test needed Arrive at -------530 am 06-29-2021 NPO after MN NO Solid Food.  Clear liquids from MN until---430 am Med rec completed Medications to take morning of surgery -----dexamethasone prn, compazine prn Diabetic medication -----n/a Patient instructed no nail polish to be worn day of surgery Patient instructed to bring photo id and insurance card day of surgery Patient aware to have Driver (ride ) / caregiver     son driver here, boyfriend driver home for 24 hours after surgery  Patient Special Instructions -----none Pre-Op special Istructions -----none Patient verbalized understanding of instructions that were given at this phone interview. Patient denies shortness of breath, chest pain, fever, cough at this phone interview.

## 2021-06-26 ENCOUNTER — Other Ambulatory Visit (HOSPITAL_COMMUNITY): Payer: Self-pay

## 2021-06-28 NOTE — Progress Notes (Signed)
  Radiation Oncology         [phone] ________________________________  Name: ABBEE CREMEENS MRN: [id number]  Date: 06/29/2021  DOB: [birth year]  CC: [name], MD  HDR BRACHYTHERAPY NOTE  DIAGNOSIS: The encounter diagnosis was Cervical adenocarcinoma [facility]).   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 Identity was confirmed. All relevant records and images related to the planned course of therapy were reviewed. The patient freely provided informed written consent to proceed with treatment after reviewing the details related to the planned course of therapy. The consent form was witnessed and verified by the simulation staff. Then, the patient was set-up in a stable reproducible supine position for radiation therapy. The tandem ring system was accessed and fiducial markers were placed within the tandem and ring.   Simple treatment device note: On the operating room the patient had construction of her custom tandem ring system. She will be treated with a 45 tandem/ring system. The patient had placement of a 40 mm tandem. A cervical ring with a small shielding was used for her treatment. A rectal paddle was also part of her custom set up device.  Verification simulation note: An AP and lateral film was obtained through the pelvis area. This was compared to the patient's planning films documenting accurate position of the tandem/ring system for treatment.  High-dose-rate brachytherapy treatment note:   The remote afterloading device was accessed through catheter system and attached to the tandem ring system. Patient then proceeded to undergo her third high-dose-rate treatment directed at the cervix. The  patient was prescribed a dose of 5.5 gray to be delivered to the [place].Marland Kitchen Patient was treated with 2 channels using 17 dwell positions. Treatment time was 634.2 seconds. The patient tolerated the procedure well. After completion of her therapy, a radiation survey was performed documenting return of the iridium source into the GammaMed safe. The patient was then transferred to the nursing suite.  She then had removal of the rectal paddle followed by the tandem and ring system. The patient tolerated the removal well.  PLAN: The patient will return next week for her fourth high-dose-rate treatment. ________________________________   -----------------------------------  Blair Promise, PhD, MD  This document serves as a record of services personally performed by Gery Pray, MD. It was created on his behalf by Roney Mans, a trained medical scribe. The creation of this record is based on the scribe's personal observations and the provider's statements to them. This document has been checked and approved by the attending provider.

## 2021-06-28 NOTE — H&P (View-Only) (Signed)
  Radiation Oncology         [phone] ________________________________  Name: Robin Steele MRN: [id number]  Date: 06/29/2021  DOB: 1965/12/20  CC: [name], MD  HDR BRACHYTHERAPY NOTE  DIAGNOSIS: The encounter diagnosis was Cervical adenocarcinoma [facility]).   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 Identity was confirmed. All relevant records and images related to the planned course of therapy were reviewed. The patient freely provided informed written consent to proceed with treatment after reviewing the details related to the planned course of therapy. The consent form was witnessed and verified by the simulation staff. Then, the patient was set-up in a stable reproducible supine position for radiation therapy. The tandem ring system was accessed and fiducial markers were placed within the tandem and ring.   Simple treatment device note: On the operating room the patient had construction of her custom tandem ring system. She will be treated with a 45 tandem/ring system. The patient had placement of a 40 mm tandem. A cervical ring with a small shielding was used for her treatment. A rectal paddle was also part of her custom set up device.  Verification simulation note: An AP and lateral film was obtained through the pelvis area. This was compared to the patient's planning films documenting accurate position of the tandem/ring system for treatment.  High-dose-rate brachytherapy treatment note:   The remote afterloading device was accessed through catheter system and attached to the tandem ring system. Patient then proceeded to undergo her third high-dose-rate treatment directed at the cervix. The  patient was prescribed a dose of 5.5 gray to be delivered to the [place].Marland Kitchen Patient was treated with 2 channels using 17 dwell positions. Treatment time was 634.2 seconds. The patient tolerated the procedure well. After completion of her therapy, a radiation survey was performed documenting return of the iridium source into the GammaMed safe. The patient was then transferred to the nursing suite.  She then had removal of the rectal paddle followed by the tandem and ring system. The patient tolerated the removal well.  PLAN: The patient will return next week for her fourth high-dose-rate treatment. ________________________________   -----------------------------------  Blair Promise, PhD, MD  This document serves as a record of services personally performed by Gery Pray, MD. It was created on his behalf by Roney Mans, a trained medical scribe. The creation of this record is based on the scribe's personal observations and the provider's statements to them. This document has been checked and approved by the attending provider.

## 2021-06-28 NOTE — Anesthesia Preprocedure Evaluation (Addendum)
Anesthesia Evaluation  Patient identified by MRN, date of birth, ID band Patient awake    Reviewed: Allergy & Precautions, NPO status , Patient's Chart, lab work & pertinent test results  Airway Mallampati: II  TM Distance: >3 FB Neck ROM: Full    Dental no notable dental hx. (+) Teeth Intact, Dental Advisory Given   Pulmonary neg pulmonary ROS,    Pulmonary exam normal breath sounds clear to auscultation       Cardiovascular Exercise Tolerance: Good Normal cardiovascular exam Rhythm:Regular Rate:Normal     Neuro/Psych Anxiety Depression negative neurological ROS     GI/Hepatic negative GI ROS,   Endo/Other  negative endocrine ROS  Renal/GU    Adenocarcinoma of the cervix Stage #    Musculoskeletal  (+) Arthritis ,   Abdominal   Peds  Hematology   Anesthesia Other Findings   Reproductive/Obstetrics                            Anesthesia Physical Anesthesia Plan  ASA: 2  Anesthesia Plan: General   Post-op Pain Management:    Induction:   PONV Risk Score and Plan: 3 and Treatment may vary due to age or medical condition, Midazolam and Ondansetron  Airway Management Planned: LMA  Additional Equipment: None  Intra-op Plan:   Post-operative Plan:   Informed Consent: I have reviewed the patients History and Physical, chart, labs and discussed the procedure including the risks, benefits and alternatives for the proposed anesthesia with the patient or authorized representative who has indicated his/her understanding and acceptance.     Dental advisory given  Plan Discussed with:   Anesthesia Plan Comments:        Anesthesia Quick Evaluation

## 2021-06-29 ENCOUNTER — Ambulatory Visit (HOSPITAL_BASED_OUTPATIENT_CLINIC_OR_DEPARTMENT_OTHER): Payer: Managed Care, Other (non HMO) | Admitting: Certified Registered Nurse Anesthetist

## 2021-06-29 ENCOUNTER — Ambulatory Visit (HOSPITAL_BASED_OUTPATIENT_CLINIC_OR_DEPARTMENT_OTHER)
Admission: RE | Admit: 2021-06-29 | Discharge: 2021-06-29 | Disposition: A | Discharge status: Home / Self Care | Payer: Managed Care, Other (non HMO) | Attending: Radiation Oncology | Admitting: Radiation Oncology

## 2021-06-29 ENCOUNTER — Other Ambulatory Visit: Payer: Self-pay

## 2021-06-29 ENCOUNTER — Ambulatory Visit (HOSPITAL_COMMUNITY)
Admission: RE | Admit: 2021-06-29 | Discharge: 2021-06-29 | Disposition: A | Discharge status: Home / Self Care | Payer: Managed Care, Other (non HMO) | Source: Ambulatory Visit | Attending: Radiation Oncology | Admitting: Radiation Oncology

## 2021-06-29 ENCOUNTER — Encounter (HOSPITAL_BASED_OUTPATIENT_CLINIC_OR_DEPARTMENT_OTHER): Payer: Self-pay | Admitting: Radiation Oncology

## 2021-06-29 ENCOUNTER — Other Ambulatory Visit (HOSPITAL_COMMUNITY): Payer: Self-pay | Admitting: Radiation Oncology

## 2021-06-29 ENCOUNTER — Ambulatory Visit
Admission: RE | Admit: 2021-06-29 | Discharge: 2021-06-29 | Disposition: A | Discharge status: Home / Self Care | Payer: Managed Care, Other (non HMO) | Source: Ambulatory Visit | Attending: Radiation Oncology | Admitting: Radiation Oncology

## 2021-06-29 ENCOUNTER — Encounter (HOSPITAL_BASED_OUTPATIENT_CLINIC_OR_DEPARTMENT_OTHER)
Admission: RE | Disposition: A | Discharge status: Home / Self Care | Payer: Self-pay | Source: Home / Self Care | Attending: Radiation Oncology

## 2021-06-29 VITALS — BP 143/85 | HR 72

## 2021-06-29 DIAGNOSIS — C539 Malignant neoplasm of cervix uteri, unspecified: Secondary | ICD-10-CM

## 2021-06-29 DIAGNOSIS — Z01818 Encounter for other preprocedural examination: Secondary | ICD-10-CM

## 2021-06-29 HISTORY — PX: OPERATIVE ULTRASOUND: SHX5996

## 2021-06-29 HISTORY — PX: TANDEM RING INSERTION: SHX6199

## 2021-06-29 LAB — RAD ONC ARIA SESSION SUMMARY
Course Elapsed Days: 77
Plan Fractions Treated to Date: 1
Plan Prescribed Dose Per Fraction: 5.5 Gy
Plan Total Fractions Prescribed: 1
Plan Total Prescribed Dose: 5.5 Gy
Reference Point Dosage Given to Date: 15.1955 Gy
Reference Point Dosage Given to Date: 15.3211 Gy
Reference Point Dosage Given to Date: 16.5 Gy
Reference Point Dosage Given to Date: 33.7041 Gy
Reference Point Dosage Given to Date: 34.6234 Gy
Reference Point Session Dosage Given: 10.2814 Gy
Reference Point Session Dosage Given: 10.8618 Gy
Reference Point Session Dosage Given: 4.9433 Gy
Reference Point Session Dosage Given: 5.1048 Gy
Reference Point Session Dosage Given: 5.5 Gy
Session Number: 33

## 2021-06-29 SURGERY — INSERTION, UTERINE TANDEM AND RING OR CYLINDER, FOR BRACHYTHERAPY
Anesthesia: General

## 2021-06-29 MED ORDER — HYDROMORPHONE HCL 1 MG/ML IJ SOLN
INTRAMUSCULAR | Status: AC
  Filled 2021-06-29: qty 1

## 2021-06-29 MED ORDER — ACETAMINOPHEN 10 MG/ML IV SOLN: 1000.0000 mg | Freq: Once | INTRAVENOUS | Status: DC | PRN

## 2021-06-29 MED ORDER — KETOROLAC TROMETHAMINE 30 MG/ML IJ SOLN
INTRAMUSCULAR | Status: AC
  Filled 2021-06-29: qty 1

## 2021-06-29 MED ORDER — LACTATED RINGERS IV SOLN
INTRAVENOUS | Status: DC
  Filled 2021-06-29 (×2): qty 250

## 2021-06-29 MED ORDER — HYDROMORPHONE HCL 1 MG/ML IJ SOLN
0.5000 mg | Freq: Once | INTRAMUSCULAR | Status: AC
  Administered 2021-06-29: 0.5 mg via INTRAVENOUS
  Filled 2021-06-29: qty 1

## 2021-06-29 MED ORDER — ONDANSETRON HCL 4 MG/2ML IJ SOLN: 4.0000 mg | Freq: Once | INTRAMUSCULAR | Status: DC | PRN

## 2021-06-29 MED ORDER — MIDAZOLAM HCL 2 MG/2ML IJ SOLN
INTRAMUSCULAR | Status: DC | PRN
  Administered 2021-06-29: 2 mg via INTRAVENOUS

## 2021-06-29 MED ORDER — PHENYLEPHRINE 80 MCG/ML (10ML) SYRINGE FOR IV PUSH (FOR BLOOD PRESSURE SUPPORT)
PREFILLED_SYRINGE | INTRAVENOUS | Status: DC | PRN
  Administered 2021-06-29: 80 ug via INTRAVENOUS

## 2021-06-29 MED ORDER — FENTANYL CITRATE (PF) 250 MCG/5ML IJ SOLN
INTRAMUSCULAR | Status: DC | PRN
  Administered 2021-06-29: 25 ug via INTRAVENOUS
  Administered 2021-06-29: 50 ug via INTRAVENOUS

## 2021-06-29 MED ORDER — FENTANYL CITRATE (PF) 100 MCG/2ML IJ SOLN
INTRAMUSCULAR | Status: AC
  Filled 2021-06-29: qty 2

## 2021-06-29 MED ORDER — LIDOCAINE 2% (20 MG/ML) 5 ML SYRINGE
INTRAMUSCULAR | Status: DC | PRN
  Administered 2021-06-29: 80 mg via INTRAVENOUS

## 2021-06-29 MED ORDER — ONDANSETRON HCL 4 MG/2ML IJ SOLN
INTRAMUSCULAR | Status: DC | PRN
  Administered 2021-06-29: 4 mg via INTRAVENOUS

## 2021-06-29 MED ORDER — DEXAMETHASONE SODIUM PHOSPHATE 10 MG/ML IJ SOLN
INTRAMUSCULAR | Status: DC | PRN
  Administered 2021-06-29: 10 mg via INTRAVENOUS

## 2021-06-29 MED ORDER — FENTANYL CITRATE (PF) 100 MCG/2ML IJ SOLN
25.0000 ug | INTRAMUSCULAR | Status: DC | PRN
  Administered 2021-06-29 (×2): 25 ug via INTRAVENOUS

## 2021-06-29 MED ORDER — PROPOFOL 10 MG/ML IV BOLUS
INTRAVENOUS | Status: DC | PRN
  Administered 2021-06-29: 140 mg via INTRAVENOUS

## 2021-06-29 MED ORDER — LACTATED RINGERS IV SOLN: INTRAVENOUS | Status: DC

## 2021-06-29 MED ORDER — MIDAZOLAM HCL 2 MG/2ML IJ SOLN
INTRAMUSCULAR | Status: AC
  Filled 2021-06-29: qty 2

## 2021-06-29 MED ORDER — PROPOFOL 10 MG/ML IV BOLUS
INTRAVENOUS | Status: AC
  Filled 2021-06-29: qty 20

## 2021-06-29 MED ORDER — KETOROLAC TROMETHAMINE 30 MG/ML IJ SOLN
INTRAMUSCULAR | Status: DC | PRN
  Administered 2021-06-29: 30 mg via INTRAVENOUS

## 2021-06-29 SURGICAL SUPPLY — 20 items
BNDG CONFORM 2 STRL LF (GAUZE/BANDAGES/DRESSINGS) IMPLANT
DILATOR CANAL MILEX (MISCELLANEOUS) IMPLANT
DRSG PAD ABDOMINAL 8X10 ST (GAUZE/BANDAGES/DRESSINGS) ×3 IMPLANT
DRSG TELFA 3X8 NADH (GAUZE/BANDAGES/DRESSINGS) ×2 IMPLANT
GAUZE 4X4 16PLY ~~LOC~~+RFID DBL (SPONGE) ×6 IMPLANT
GAUZE PAD ABD 8X10 STRL (GAUZE/BANDAGES/DRESSINGS) ×1 IMPLANT
GLOVE BIO SURGEON STRL SZ7.5 (GLOVE) ×6 IMPLANT
GOWN STRL REUS W/TWL LRG LVL3 (GOWN DISPOSABLE) ×3 IMPLANT
HOLDER FOLEY CATH W/STRAP (MISCELLANEOUS) ×3 IMPLANT
IV NS 1000ML (IV SOLUTION) ×2
IV NS 1000ML BAXH (IV SOLUTION) ×2 IMPLANT
IV SET EXTENSION GRAVITY 40 LF (IV SETS) ×3 IMPLANT
KIT TURNOVER CYSTO (KITS) ×3 IMPLANT
MAT PREVALON FULL STRYKER (MISCELLANEOUS) ×3 IMPLANT
PACK VAGINAL MINOR WOMEN LF (CUSTOM PROCEDURE TRAY) ×3 IMPLANT
PACKING VAGINAL (PACKING) IMPLANT
PAD DRESSING TELFA 3X8 NADH (GAUZE/BANDAGES/DRESSINGS) ×2 IMPLANT
TOWEL OR 17X26 10 PK STRL BLUE (TOWEL DISPOSABLE) ×3 IMPLANT
TRAY FOLEY W/BAG SLVR 14FR LF (SET/KITS/TRAYS/PACK) ×3 IMPLANT
WATER STERILE IRR 500ML POUR (IV SOLUTION) ×3 IMPLANT

## 2021-06-29 NOTE — Progress Notes (Signed)
IMMEDIATELY FOLLOWING SURGERY: Do not drive or operate machinery for the first twenty four hours after surgery. Do not make any important decisions for twenty four hours after surgery or while taking narcotic pain medications or sedatives. If you develop intractable nausea and vomiting or a severe headache please notify your doctor immediately.   FOLLOW-UP: You do not need to follow up with anesthesia unless specifically instructed to do so.   WOUND CARE INSTRUCTIONS (if applicable): Expect some mild vaginal bleeding, but if large amount of bleeding occurs please contact Dr. Sondra Come at [phone] or the Radiation On-Call physician. Call for any fever greater than 101.0 degrees or increasing vaginal//abdominal pain or trouble urinating.   QUESTIONS?: Please feel free to call your physician or the hospital operator if you have any questions, and they will be happy to assist you. Resume all medications: as listed on your after visit summary. Your next appointment is:  Future Appointments  Date Time Provider [place]  07/06/2021 11:00 AM WL-US 1 WL-US [place]  07/06/2021  1:00 PM Gery Pray, MD [facility] None  07/06/2021  3:00 PM Gery Pray, MD [facility] None  07/07/2021 11:00 AM WL-US 1 WL-US [place]  07/07/2021  1:00 PM Gery Pray, MD [place] None  07/07/2021  3:00 PM Gery Pray, MD [facility] None  07/14/2021  9:00 AM WL-US 1 WL-US [place]  07/14/2021  1:00 PM Gery Pray, MD [facility] None  07/14/2021  3:00 PM Gery Pray, MD [facility] None  07/24/2021 12:00 PM [place] [facility] None  07/24/2021 12:40 PM Heath Lark, MD [facility] None

## 2021-06-29 NOTE — Transfer of Care (Signed)
Immediate Anesthesia Transfer of Care Note  Patient: Robin Steele  Procedure(s) Performed: TANDEM RING INSERTION OPERATIVE ULTRASOUND  Patient Location: PACU  Anesthesia Type:General  Level of Consciousness: awake, alert  and oriented  Airway & Oxygen Therapy: Patient Spontanous Breathing  Post-op Assessment: Report given to RN and Post -op Vital signs reviewed and stable  Post vital signs: Reviewed and stable  Last Vitals:  Vitals Value Taken Time  BP 167/95 06/29/21 0822  Temp    Pulse 73 06/29/21 0825  Resp 13 06/29/21 0825  SpO2 98 % 06/29/21 0825  Vitals shown include unvalidated device data.  Last Pain:  Vitals:   06/29/21 0541  TempSrc: Oral  PainSc: 0-No pain      Patients Stated Pain Goal: 5 (21/82/88 3374)  Complications: No notable events documented.

## 2021-06-29 NOTE — Anesthesia Procedure Notes (Signed)
Procedure Name: LMA Insertion Date/Time: 06/29/2021 7:41 AM  Performed by: Clearnce Sorrel, CRNAPre-anesthesia Checklist: Patient identified, Emergency Drugs available, Suction available and Patient being monitored Patient Re-evaluated:Patient Re-evaluated prior to induction Oxygen Delivery Method: Circle System Utilized Preoxygenation: Pre-oxygenation with 100% oxygen Induction Type: IV induction Ventilation: Mask ventilation without difficulty LMA: LMA inserted LMA Size: 3.0 Number of attempts: 1 Airway Equipment and Method: Bite block Placement Confirmation: positive ETCO2 Tube secured with: Tape Dental Injury: Teeth and Oropharynx as per pre-operative assessment

## 2021-06-29 NOTE — Anesthesia Postprocedure Evaluation (Signed)
Anesthesia Post Note  Patient: Robin Steele  Procedure(s) Performed: TANDEM RING INSERTION OPERATIVE ULTRASOUND     Patient location during evaluation: PACU Anesthesia Type: General Level of consciousness: awake and alert Pain management: pain level controlled Vital Signs Assessment: post-procedure vital signs reviewed and stable Respiratory status: spontaneous breathing, nonlabored ventilation, respiratory function stable and patient connected to nasal cannula oxygen Cardiovascular status: blood pressure returned to baseline and stable Postop Assessment: no apparent nausea or vomiting Anesthetic complications: no   No notable events documented.  Last Vitals:  Vitals:   06/29/21 0845 06/29/21 0900  BP: (!) 154/98 (!) 155/92  Pulse: 74 73  Resp: 13 18  Temp:    SpO2: 99% 100%    Last Pain:  Vitals:   06/29/21 0911  TempSrc:   PainSc: 5                  Barnet Glasgow

## 2021-06-30 ENCOUNTER — Other Ambulatory Visit: Payer: Self-pay

## 2021-06-30 ENCOUNTER — Encounter (HOSPITAL_BASED_OUTPATIENT_CLINIC_OR_DEPARTMENT_OTHER): Payer: Self-pay | Admitting: Radiation Oncology

## 2021-07-05 NOTE — Progress Notes (Signed)
  Radiation Oncology         [phone] ________________________________  Name: Robin Steele MRN: [id number]  Date: 07/06/2021  DOB: 1965/04/20  CC: [name], MD  HDR BRACHYTHERAPY NOTE  DIAGNOSIS: The encounter diagnosis was Cervical adenocarcinoma [facility]).   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 Identity was confirmed. All relevant records and images related to the planned course of therapy were reviewed. The patient freely provided informed written consent to proceed with treatment after reviewing the details related to the planned course of therapy. The consent form was witnessed and verified by the simulation staff. Then, the patient was set-up in a stable reproducible supine position for radiation therapy. The tandem ring system was accessed and fiducial markers were placed within the tandem and ring.   Simple treatment device note: On the operating room the patient had construction of her custom tandem ring system. She will be treated with a 45 tandem/ring system. The patient had placement of a 40 mm tandem. A cervical ring with a small shielding was used for her treatment. A rectal paddle was also part of her custom set up device.  Verification simulation note: An AP and lateral film was obtained through the pelvis area. This was compared to the patient's planning films documenting accurate position of the tandem/ring system for treatment.  High-dose-rate brachytherapy treatment note:   The remote afterloading device was accessed through catheter system and attached to the tandem ring system. Patient then proceeded to undergo her fourth high-dose-rate treatment directed at the cervix. The  patient was prescribed a dose of 5.5 gray to be delivered to the [place].Marland Kitchen Patient was treated with 2 channels using 17 dwell positions. Treatment time was *** seconds. The patient tolerated the procedure well. After completion of her therapy, a radiation survey was performed documenting return of the iridium source into the GammaMed safe. The patient was then transferred to the nursing suite.  She then had removal of the rectal paddle followed by the tandem and ring system. The patient tolerated the removal well.  PLAN: The patient will return next week for her fifth high-dose-rate treatment. ________________________________   -----------------------------------  Blair Promise, PhD, MD  This document serves as a record of services personally performed by Gery Pray, MD. It was created on his behalf by Roney Mans, a trained medical scribe. The creation of this record is based on the scribe's personal observations and the provider's statements to them. This document has been checked and approved by the attending provider.

## 2021-07-06 ENCOUNTER — Ambulatory Visit
Admission: RE | Admit: 2021-07-06 | Discharge: 2021-07-06 | Disposition: A | Discharge status: Home / Self Care | Payer: Managed Care, Other (non HMO) | Source: Ambulatory Visit | Attending: Radiation Oncology | Admitting: Radiation Oncology

## 2021-07-06 ENCOUNTER — Ambulatory Visit (HOSPITAL_COMMUNITY)
Admission: RE | Admit: 2021-07-06 | Discharge: 2021-07-06 | Disposition: A | Discharge status: Home / Self Care | Payer: Managed Care, Other (non HMO) | Source: Ambulatory Visit | Attending: Radiation Oncology | Admitting: Radiation Oncology

## 2021-07-06 ENCOUNTER — Other Ambulatory Visit: Payer: Self-pay

## 2021-07-06 ENCOUNTER — Ambulatory Visit (HOSPITAL_BASED_OUTPATIENT_CLINIC_OR_DEPARTMENT_OTHER): Payer: Managed Care, Other (non HMO) | Admitting: Anesthesiology

## 2021-07-06 ENCOUNTER — Encounter (HOSPITAL_BASED_OUTPATIENT_CLINIC_OR_DEPARTMENT_OTHER)
Admission: RE | Disposition: A | Discharge status: Home / Self Care | Payer: Self-pay | Source: Home / Self Care | Attending: Radiation Oncology

## 2021-07-06 ENCOUNTER — Ambulatory Visit (HOSPITAL_BASED_OUTPATIENT_CLINIC_OR_DEPARTMENT_OTHER)
Admission: RE | Admit: 2021-07-06 | Discharge: 2021-07-06 | Disposition: A | Discharge status: Home / Self Care | Payer: Managed Care, Other (non HMO) | Attending: Radiation Oncology | Admitting: Radiation Oncology

## 2021-07-06 ENCOUNTER — Encounter (HOSPITAL_BASED_OUTPATIENT_CLINIC_OR_DEPARTMENT_OTHER): Payer: Self-pay | Admitting: Radiation Oncology

## 2021-07-06 VITALS — BP 126/78 | HR 78 | Temp 96.8°F | Resp 19

## 2021-07-06 DIAGNOSIS — Z01818 Encounter for other preprocedural examination: Secondary | ICD-10-CM

## 2021-07-06 DIAGNOSIS — C539 Malignant neoplasm of cervix uteri, unspecified: Secondary | ICD-10-CM

## 2021-07-06 HISTORY — PX: TANDEM RING INSERTION: SHX6199

## 2021-07-06 HISTORY — PX: OPERATIVE ULTRASOUND: SHX5996

## 2021-07-06 LAB — RAD ONC ARIA SESSION SUMMARY
Course Elapsed Days: 84
Plan Fractions Treated to Date: 1
Plan Prescribed Dose Per Fraction: 5.5 Gy
Plan Total Fractions Prescribed: 1
Plan Total Prescribed Dose: 5.5 Gy
Reference Point Dosage Given to Date: 20.76 Gy
Reference Point Dosage Given to Date: 21.0844 Gy
Reference Point Dosage Given to Date: 22 Gy
Reference Point Dosage Given to Date: 44.0099 Gy
Reference Point Dosage Given to Date: 45.3769 Gy
Reference Point Session Dosage Given: 10.3058 Gy
Reference Point Session Dosage Given: 10.7535 Gy
Reference Point Session Dosage Given: 5.4389 Gy
Reference Point Session Dosage Given: 5.5 Gy
Reference Point Session Dosage Given: 5.8889 Gy
Session Number: 34

## 2021-07-06 SURGERY — INSERTION, UTERINE TANDEM AND RING OR CYLINDER, FOR BRACHYTHERAPY
Anesthesia: General | Site: Vagina

## 2021-07-06 MED ORDER — HYDROMORPHONE HCL 1 MG/ML IJ SOLN
0.5000 mg | Freq: Once | INTRAMUSCULAR | Status: AC
  Administered 2021-07-06: 0.5 mg via INTRAVENOUS
  Filled 2021-07-06: qty 1

## 2021-07-06 MED ORDER — DEXAMETHASONE SODIUM PHOSPHATE 10 MG/ML IJ SOLN
INTRAMUSCULAR | Status: DC | PRN
  Administered 2021-07-06: 10 mg via INTRAVENOUS

## 2021-07-06 MED ORDER — DEXAMETHASONE SODIUM PHOSPHATE 10 MG/ML IJ SOLN
INTRAMUSCULAR | Status: AC
  Filled 2021-07-06: qty 2

## 2021-07-06 MED ORDER — LACTATED RINGERS IV SOLN
INTRAVENOUS | Status: DC
  Filled 2021-07-06 (×2): qty 250

## 2021-07-06 MED ORDER — OXYCODONE HCL 5 MG/5ML PO SOLN: 5.0000 mg | Freq: Once | ORAL | Status: DC | PRN

## 2021-07-06 MED ORDER — ONDANSETRON HCL 4 MG/2ML IJ SOLN: 4.0000 mg | Freq: Once | INTRAMUSCULAR | Status: DC | PRN

## 2021-07-06 MED ORDER — FENTANYL CITRATE (PF) 100 MCG/2ML IJ SOLN
INTRAMUSCULAR | Status: AC
  Filled 2021-07-06: qty 2

## 2021-07-06 MED ORDER — PROPOFOL 10 MG/ML IV BOLUS
INTRAVENOUS | Status: DC | PRN
  Administered 2021-07-06: 60 mg via INTRAVENOUS
  Administered 2021-07-06: 40 mg via INTRAVENOUS
  Administered 2021-07-06: 100 mg via INTRAVENOUS

## 2021-07-06 MED ORDER — EPHEDRINE SULFATE (PRESSORS) 50 MG/ML IJ SOLN
INTRAMUSCULAR | Status: DC | PRN
  Administered 2021-07-06: 5 mg via INTRAVENOUS

## 2021-07-06 MED ORDER — KETOROLAC TROMETHAMINE 30 MG/ML IJ SOLN
INTRAMUSCULAR | Status: DC | PRN
  Administered 2021-07-06: 30 mg via INTRAVENOUS

## 2021-07-06 MED ORDER — LACTATED RINGERS IV SOLN: INTRAVENOUS | Status: DC

## 2021-07-06 MED ORDER — ROCURONIUM BROMIDE 10 MG/ML (PF) SYRINGE
PREFILLED_SYRINGE | INTRAVENOUS | Status: AC
  Filled 2021-07-06: qty 10

## 2021-07-06 MED ORDER — EPHEDRINE 5 MG/ML INJ
INTRAVENOUS | Status: AC
  Filled 2021-07-06: qty 10

## 2021-07-06 MED ORDER — HYDROMORPHONE HCL 1 MG/ML IJ SOLN
INTRAMUSCULAR | Status: AC
  Filled 2021-07-06: qty 1

## 2021-07-06 MED ORDER — MIDAZOLAM HCL 2 MG/2ML IJ SOLN
INTRAMUSCULAR | Status: DC | PRN
  Administered 2021-07-06: 2 mg via INTRAVENOUS

## 2021-07-06 MED ORDER — FENTANYL CITRATE (PF) 100 MCG/2ML IJ SOLN: 25.0000 ug | INTRAMUSCULAR | Status: DC | PRN

## 2021-07-06 MED ORDER — AMISULPRIDE (ANTIEMETIC) 5 MG/2ML IV SOLN: 10.0000 mg | Freq: Once | INTRAVENOUS | Status: DC | PRN

## 2021-07-06 MED ORDER — SODIUM CHLORIDE 0.9 % IR SOLN
Status: DC | PRN
  Administered 2021-07-06: 1000 mL via INTRAVESICAL

## 2021-07-06 MED ORDER — KETOROLAC TROMETHAMINE 30 MG/ML IJ SOLN: 30.0000 mg | Freq: Once | INTRAMUSCULAR | Status: AC | PRN

## 2021-07-06 MED ORDER — ONDANSETRON HCL 4 MG/2ML IJ SOLN
INTRAMUSCULAR | Status: AC
  Filled 2021-07-06: qty 4

## 2021-07-06 MED ORDER — KETOROLAC TROMETHAMINE 30 MG/ML IJ SOLN
INTRAMUSCULAR | Status: AC
  Filled 2021-07-06: qty 2

## 2021-07-06 MED ORDER — LIDOCAINE 2% (20 MG/ML) 5 ML SYRINGE
INTRAMUSCULAR | Status: DC | PRN
  Administered 2021-07-06: 40 mg via INTRAVENOUS

## 2021-07-06 MED ORDER — PHENYLEPHRINE 80 MCG/ML (10ML) SYRINGE FOR IV PUSH (FOR BLOOD PRESSURE SUPPORT)
PREFILLED_SYRINGE | INTRAVENOUS | Status: DC | PRN
  Administered 2021-07-06: 160 ug via INTRAVENOUS
  Administered 2021-07-06 (×3): 80 ug via INTRAVENOUS

## 2021-07-06 MED ORDER — PHENYLEPHRINE 80 MCG/ML (10ML) SYRINGE FOR IV PUSH (FOR BLOOD PRESSURE SUPPORT)
PREFILLED_SYRINGE | INTRAVENOUS | Status: AC
  Filled 2021-07-06: qty 10

## 2021-07-06 MED ORDER — MIDAZOLAM HCL 2 MG/2ML IJ SOLN
INTRAMUSCULAR | Status: AC
  Filled 2021-07-06: qty 2

## 2021-07-06 MED ORDER — OXYCODONE HCL 5 MG PO TABS: 5.0000 mg | ORAL_TABLET | Freq: Once | ORAL | Status: DC | PRN

## 2021-07-06 MED ORDER — ONDANSETRON HCL 4 MG/2ML IJ SOLN
INTRAMUSCULAR | Status: DC | PRN
  Administered 2021-07-06: 4 mg via INTRAVENOUS

## 2021-07-06 MED ORDER — FENTANYL CITRATE (PF) 250 MCG/5ML IJ SOLN
INTRAMUSCULAR | Status: DC | PRN
  Administered 2021-07-06: 50 ug via INTRAVENOUS

## 2021-07-06 SURGICAL SUPPLY — 19 items
BNDG CONFORM 2 STRL LF (GAUZE/BANDAGES/DRESSINGS) IMPLANT
DILATOR CANAL MILEX (MISCELLANEOUS) IMPLANT
DRSG PAD ABDOMINAL 8X10 ST (GAUZE/BANDAGES/DRESSINGS) ×4 IMPLANT
DRSG TELFA 3X8 NADH (GAUZE/BANDAGES/DRESSINGS) ×3 IMPLANT
GAUZE 4X4 16PLY ~~LOC~~+RFID DBL (SPONGE) ×8 IMPLANT
GLOVE BIO SURGEON STRL SZ7.5 (GLOVE) ×8 IMPLANT
GOWN STRL REUS W/TWL LRG LVL3 (GOWN DISPOSABLE) ×4 IMPLANT
HOLDER FOLEY CATH W/STRAP (MISCELLANEOUS) ×4 IMPLANT
IV NS 1000ML (IV SOLUTION) ×3
IV NS 1000ML BAXH (IV SOLUTION) ×3 IMPLANT
IV SET EXTENSION GRAVITY 40 LF (IV SETS) ×4 IMPLANT
KIT TURNOVER CYSTO (KITS) ×4 IMPLANT
MAT PREVALON FULL STRYKER (MISCELLANEOUS) ×4 IMPLANT
PACK VAGINAL MINOR WOMEN LF (CUSTOM PROCEDURE TRAY) ×4 IMPLANT
PACKING VAGINAL (PACKING) IMPLANT
PAD DRESSING TELFA 3X8 NADH (GAUZE/BANDAGES/DRESSINGS) ×3 IMPLANT
TOWEL OR 17X26 10 PK STRL BLUE (TOWEL DISPOSABLE) ×4 IMPLANT
TRAY FOLEY W/BAG SLVR 14FR LF (SET/KITS/TRAYS/PACK) ×4 IMPLANT
WATER STERILE IRR 500ML POUR (IV SOLUTION) ×4 IMPLANT

## 2021-07-06 NOTE — Anesthesia Preprocedure Evaluation (Addendum)
Anesthesia Evaluation  Patient identified by MRN, date of birth, ID band Patient awake    Reviewed: Allergy & Precautions, NPO status , Patient's Chart, lab work & pertinent test results  Airway Mallampati: I  TM Distance: >3 FB Neck ROM: Full    Dental  (+) Teeth Intact, Dental Advisory Given   Pulmonary neg pulmonary ROS,    Pulmonary exam normal breath sounds clear to auscultation       Cardiovascular negative cardio ROS Normal cardiovascular exam Rhythm:Regular Rate:Normal     Neuro/Psych PSYCHIATRIC DISORDERS Anxiety Depression    GI/Hepatic negative GI ROS, Neg liver ROS,   Endo/Other  diabetes (pre-diabetic)  Renal/GU negative Renal ROS     Musculoskeletal  (+) Arthritis , Osteoarthritis,    Abdominal   Peds  Hematology negative hematology ROS (+)   Anesthesia Other Findings   Reproductive/Obstetrics Cervical ca                            Anesthesia Physical Anesthesia Plan  ASA: 2  Anesthesia Plan: General   Post-op Pain Management: Minimal or no pain anticipated   Induction: Intravenous  PONV Risk Score and Plan: 4 or greater and Ondansetron, Dexamethasone, Midazolam and Treatment may vary due to age or medical condition  Airway Management Planned: LMA  Additional Equipment: None  Intra-op Plan:   Post-operative Plan: Extubation in OR  Informed Consent: I have reviewed the patients History and Physical, chart, labs and discussed the procedure including the risks, benefits and alternatives for the proposed anesthesia with the patient or authorized representative who has indicated his/her understanding and acceptance.     Dental advisory given  Plan Discussed with: CRNA  Anesthesia Plan Comments: (Last tandem ring 6/22/2: Mask ventilation without difficulty LMA: LMA inserted LMA Size: 3.0 Number of attempts: 1)       Anesthesia Quick Evaluation

## 2021-07-06 NOTE — Anesthesia Postprocedure Evaluation (Signed)
Anesthesia Post Note  Patient: Robin Steele  Procedure(s) Performed: TANDEM RING INSERTION (Vagina ) OPERATIVE ULTRASOUND (Abdomen)     Patient location during evaluation: PACU Anesthesia Type: General Level of consciousness: awake and alert, oriented and patient cooperative Pain management: pain level controlled Vital Signs Assessment: post-procedure vital signs reviewed and stable Respiratory status: spontaneous breathing, nonlabored ventilation and respiratory function stable Cardiovascular status: blood pressure returned to baseline and stable Postop Assessment: no apparent nausea or vomiting Anesthetic complications: no   No notable events documented.  Last Vitals:  Vitals:   07/06/21 1155 07/06/21 1200  BP: 134/79 121/76  Pulse: 78 78  Resp: 10 17  Temp: 36.5 C   SpO2: 99% 98%    Last Pain:  Vitals:   07/06/21 0919  TempSrc: Oral  PainSc: 0-No pain                 Pervis Hocking

## 2021-07-06 NOTE — Patient Instructions (Signed)
IMMEDIATELY FOLLOWING SURGERY: Do not drive or operate machinery for the first twenty four hours after surgery. Do not make any important decisions for twenty four hours after surgery or while taking narcotic pain medications or sedatives. If you develop intractable nausea and vomiting or a severe headache please notify your doctor immediately.   FOLLOW-UP: You do not need to follow up with anesthesia unless specifically instructed to do so.   WOUND CARE INSTRUCTIONS (if applicable): Expect some mild vaginal bleeding, but if large amount of bleeding occurs please contact Dr. Sondra Come at [phone] or the Radiation On-Call physician. Call for any fever greater than 101.0 degrees or increasing vaginal//abdominal pain or trouble urinating.   QUESTIONS?: Please feel free to call your physician or the hospital operator if you have any questions, and they will be happy to assist you. Resume all medications: as listed on your after visit summary. Your next appointment is:  Future Appointments  Date Time Provider [place]  07/07/2021  1:00 PM Gery Pray, MD [facility] None  07/07/2021  3:00 PM Gery Pray, MD [facility] None  07/13/2021  7:00 AM WL-US [place] 1 WL-US [place]  07/13/2021 10:00 AM Gery Pray, MD [facility] None  07/13/2021  1:00 PM Gery Pray, MD [facility] None  07/24/2021 12:00 PM [place] [facility] None  07/24/2021 12:40 PM Heath Lark, MD [facility] None

## 2021-07-06 NOTE — Transfer of Care (Signed)
Immediate Anesthesia Transfer of Care Note  Patient: Robin Steele  Procedure(s) Performed: TANDEM RING INSERTION (Vagina ) OPERATIVE ULTRASOUND (Abdomen)  Patient Location: PACU  Anesthesia Type:General  Level of Consciousness: awake, alert  and oriented  Airway & Oxygen Therapy: Patient Spontanous Breathing  Post-op Assessment: Report given to RN and Post -op Vital signs reviewed and stable  Post vital signs: Reviewed and stable  Last Vitals:  Vitals Value Taken Time  BP 134/79 07/06/21 1155  Temp 36.5 C 07/06/21 1155  Pulse 78 07/06/21 1156  Resp 10 07/06/21 1156  SpO2 99 % 07/06/21 1156  Vitals shown include unvalidated device data.  Last Pain:  Vitals:   07/06/21 0919  TempSrc: Oral  PainSc: 0-No pain      Patients Stated Pain Goal: 5 (93/23/55 7322)  Complications: No notable events documented.

## 2021-07-06 NOTE — Interval H&P Note (Signed)
History and Physical Interval Note:  07/06/2021 11:03 AM  Robin Steele  has presented today for surgery, with the diagnosis of CERVICAL CANCER.  The various methods of treatment have been discussed with the patient and family. After consideration of risks, benefits and other options for treatment, the patient has consented to  Procedure(s): TANDEM RING INSERTION (N/A) OPERATIVE ULTRASOUND (N/A) as a surgical intervention.  The patient's history has been reviewed, patient examined, no change in status, stable for surgery.  I have reviewed the patient's chart and labs.  Questions were answered to the patient's satisfaction.     Gery Pray

## 2021-07-06 NOTE — Anesthesia Procedure Notes (Signed)
Procedure Name: LMA Insertion Date/Time: 07/06/2021 11:16 AM  Performed by: Clearnce Sorrel, CRNAPre-anesthesia Checklist: Patient identified, Emergency Drugs available, Suction available and Patient being monitored Patient Re-evaluated:Patient Re-evaluated prior to induction Oxygen Delivery Method: Circle System Utilized Preoxygenation: Pre-oxygenation with 100% oxygen Induction Type: IV induction Ventilation: Mask ventilation without difficulty LMA: LMA inserted LMA Size: 3.0 Number of attempts: 1 Airway Equipment and Method: Bite block Placement Confirmation: positive ETCO2 Tube secured with: Tape Dental Injury: Teeth and Oropharynx as per pre-operative assessment

## 2021-07-06 NOTE — Op Note (Signed)
07/06/2021  12:27 PM  PATIENT:  Robin Steele  56 y.o. female  PRE-OPERATIVE DIAGNOSIS:  CERVICAL CANCER  POST-OPERATIVE DIAGNOSIS:  CERVICAL CANCER  PROCEDURE:  Procedure(s): TANDEM RING INSERTION (N/A) OPERATIVE ULTRASOUND (N/A)  SURGEON:  Surgeon(s) and Role:    * Gery Pray, MD - Primary  PHYSICIAN ASSISTANT:   ASSISTANTS: none   ANESTHESIA:   general  EBL:  none  BLOOD ADMINISTERED:none  DRAINS: Urinary Catheter (Foley)   LOCAL MEDICATIONS USED:  NONE  SPECIMEN:  No Specimen  DISPOSITION OF SPECIMEN:  N/A  COUNTS:  YES  TOURNIQUET:  * No tourniquets in log *  DICTATION: DICTATION: Patient was taken to the [facility] operating room #4  Patient was prepped and draped in the usual sterile fashion and placed in the dorsolithotomy position.  Timeout for the procedure as well as preoperative medications and allergies was performed.  A Foley catheter was inserted and backfilled with approximately 200 cc of sterile water for ultrasound imaging purposes.  Patient then proceeded to undergo exam under anesthesia.  The cervical os was flush with the upper vaginal vault.  No fornices were noted.    On bimanual examination the cervical mass appeared to be normal in size.  There is no parametrial extension noted.  Some erythema was noted to the anterior lip of the cervical area.  The cervical sleeve placed last week remained in place and therfore no dilation of the cervical os was necessary. the uterus was anteverted.  On ultrasound there was no bulky cervical mass appreciated.   she  had placement of  a 40 mm 45 degree tandem with cervical sleeve in place.  She  then had a 45 degree cervical ring with a small shielding which was then attached to the tandem.  This was then followed by placement of a rectal paddle posteriorly to limit dose to the rectal mucosa with her high-dose-rate treatment.  She tolerated the procedure well.  Later in the day the patient will be  brought down to radiation oncology for planning and her fourth high-dose-rate treatment.  She is to receive 5.5 Pearline Cables to the high risk clinical target volume.  Iridium 192 will be the high-dose-rate source.  Plan is for the patient to receive 2 additional high-dose-rate treatments to complete her definitive course of radiation therapy.  PLAN OF CARE:  Transfer to radiation oncology for planning and treatment  PATIENT DISPOSITION:  PACU - hemodynamically stable.   Delay start of Pharmacological VTE agent (>24hrs) due to surgical blood loss or risk of bleeding: not applicable

## 2021-07-07 ENCOUNTER — Ambulatory Visit
Admission: RE | Admit: 2021-07-07 | Discharge: 2021-07-07 | Disposition: A | Discharge status: Home / Self Care | Payer: Managed Care, Other (non HMO) | Source: Ambulatory Visit | Attending: Radiation Oncology | Admitting: Radiation Oncology

## 2021-07-07 ENCOUNTER — Encounter (HOSPITAL_BASED_OUTPATIENT_CLINIC_OR_DEPARTMENT_OTHER): Admission: RE | Payer: Self-pay | Source: Home / Self Care

## 2021-07-07 ENCOUNTER — Ambulatory Visit (HOSPITAL_COMMUNITY): Payer: Managed Care, Other (non HMO)

## 2021-07-07 ENCOUNTER — Ambulatory Visit: Payer: Managed Care, Other (non HMO) | Admitting: Radiation Oncology

## 2021-07-07 ENCOUNTER — Ambulatory Visit (HOSPITAL_BASED_OUTPATIENT_CLINIC_OR_DEPARTMENT_OTHER)
Admission: RE | Admit: 2021-07-07 | Payer: Managed Care, Other (non HMO) | Source: Home / Self Care | Admitting: Radiation Oncology

## 2021-07-07 ENCOUNTER — Encounter (HOSPITAL_COMMUNITY): Payer: Self-pay

## 2021-07-07 ENCOUNTER — Encounter (HOSPITAL_BASED_OUTPATIENT_CLINIC_OR_DEPARTMENT_OTHER): Payer: Self-pay | Admitting: Radiation Oncology

## 2021-07-07 DIAGNOSIS — C539 Malignant neoplasm of cervix uteri, unspecified: Secondary | ICD-10-CM

## 2021-07-07 SURGERY — INSERTION, UTERINE TANDEM AND RING OR CYLINDER, FOR BRACHYTHERAPY
Anesthesia: General

## 2021-07-07 NOTE — Progress Notes (Signed)
Spoke w/ via phone for pre-op interview--- pt Lab needs dos----  no             Lab results------ no COVID test -----patient states asymptomatic no test needed Arrive at ------- 0530 on 07-13-2021 NPO after MN NO Solid Food.  Clear liquids from MN until--- 0430 Med rec completed Medications to take morning of surgery ----- may take prn meds Diabetic medication ----- n/a Patient instructed no nail polish to be worn day of surgery Patient instructed to bring photo id and insurance card day of surgery Patient aware to have Driver (ride ) / caregiver for 24 hours after surgery ---  son, justin Patient Special Instructions ----- n/a Pre-Op special Istructions ----- this is pt's last tandem procedure.  She was congratulated ! Patient verbalized understanding of instructions that were given at this phone interview. Patient denies shortness of breath, chest pain, fever, cough at this phone interview.

## 2021-07-12 NOTE — Anesthesia Preprocedure Evaluation (Addendum)
Anesthesia Evaluation  Patient identified by MRN, date of birth, ID band Patient awake    Reviewed: Allergy & Precautions, NPO status , Patient's Chart, lab work & pertinent test results  Airway Mallampati: I  TM Distance: >3 FB Neck ROM: Full    Dental  (+) Teeth Intact, Dental Advisory Given   Pulmonary neg pulmonary ROS,    Pulmonary exam normal breath sounds clear to auscultation       Cardiovascular negative cardio ROS Normal cardiovascular exam Rhythm:Regular Rate:Normal     Neuro/Psych PSYCHIATRIC DISORDERS Anxiety Depression    GI/Hepatic negative GI ROS, Neg liver ROS,   Endo/Other  diabetes (pre-diabetic)  Renal/GU negative Renal ROS     Musculoskeletal  (+) Arthritis , Osteoarthritis,    Abdominal   Peds  Hematology  (+) Blood dyscrasia, anemia ,   Anesthesia Other Findings   Reproductive/Obstetrics Cervical ca                             Anesthesia Physical  Anesthesia Plan  ASA: 2  Anesthesia Plan: General   Post-op Pain Management: Minimal or no pain anticipated   Induction: Intravenous  PONV Risk Score and Plan: 4 or greater and Ondansetron, Dexamethasone, Midazolam and Treatment may vary due to age or medical condition  Airway Management Planned: LMA  Additional Equipment: None  Intra-op Plan:   Post-operative Plan: Extubation in OR  Informed Consent: I have reviewed the patients History and Physical, chart, labs and discussed the procedure including the risks, benefits and alternatives for the proposed anesthesia with the patient or authorized representative who has indicated his/her understanding and acceptance.     Dental advisory given  Plan Discussed with: CRNA  Anesthesia Plan Comments: (Last tandem ring 6/229/23: Mask ventilation without difficulty LMA: LMA inserted LMA Size: 3.0 Number of attempts: 1)       Anesthesia Quick  Evaluation

## 2021-07-12 NOTE — Progress Notes (Signed)
  Radiation Oncology         [phone] ________________________________  Name: Robin Steele MRN: [id number]  Date: 07/13/2021  DOB: [birth year]  CC: [name], MD  HDR BRACHYTHERAPY NOTE  DIAGNOSIS: The encounter diagnosis was Cervical adenocarcinoma [facility]).   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 Identity was confirmed. All relevant records and images related to the planned course of therapy were reviewed. The patient freely provided informed written consent to proceed with treatment after reviewing the details related to the planned course of therapy. The consent form was witnessed and verified by the simulation staff. Then, the patient was set-up in a stable reproducible supine position for radiation therapy. The tandem ring system was accessed and fiducial markers were placed within the tandem and ring.   Simple treatment device note: On the operating room the patient had construction of her custom tandem ring system. She will be treated with a 45 tandem/ring system. The patient had placement of a 40 mm tandem. A cervical ring with a small shielding was used for her treatment. A rectal paddle was also part of her custom set up device.  Verification simulation note: An AP and lateral film was obtained through the pelvis area. This was compared to the patient's planning films documenting accurate position of the tandem/ring system for treatment.  High-dose-rate brachytherapy treatment note:   The remote afterloading device was accessed through catheter system and attached to the tandem ring system. Patient then proceeded to undergo her fifth high-dose-rate treatment directed at the cervix. The  patient was prescribed a dose of 5.5 gray to be delivered to the [place].Marland Kitchen Patient was treated with 2 channels using 13 dwell positions. Treatment time was *** seconds. The patient tolerated the procedure well. After completion of her therapy, a radiation survey was performed documenting return of the iridium source into the [employer] safe. The patient was then transferred to the nursing suite.  She then had removal of the rectal paddle followed by the tandem and ring system. The patient tolerated the removal well.  PLAN: The patient has completed her fifth and final high-dose-rate treatment. She will return in one month for routine follow-up. ***  ________________________________   -----------------------------------  Blair Promise, PhD, MD  This document serves as a record of services personally performed by Gery Pray, MD. It was created on his behalf by Roney Mans, a trained medical scribe. The creation of this record is based on the scribe's personal observations and the provider's statements to them. This document has been checked and approved by the attending provider.

## 2021-07-13 ENCOUNTER — Ambulatory Visit (HOSPITAL_BASED_OUTPATIENT_CLINIC_OR_DEPARTMENT_OTHER)
Admission: RE | Admit: 2021-07-13 | Discharge: 2021-07-13 | Disposition: A | Discharge status: Other Acute Inpatient Hospital | Payer: Managed Care, Other (non HMO) | Attending: Radiation Oncology | Admitting: Radiation Oncology

## 2021-07-13 ENCOUNTER — Encounter: Payer: Self-pay | Admitting: Radiation Oncology

## 2021-07-13 ENCOUNTER — Ambulatory Visit (HOSPITAL_COMMUNITY)
Admission: RE | Admit: 2021-07-13 | Discharge: 2021-07-13 | Disposition: A | Discharge status: Home / Self Care | Payer: Managed Care, Other (non HMO) | Source: Ambulatory Visit | Attending: Radiation Oncology | Admitting: Radiation Oncology

## 2021-07-13 ENCOUNTER — Ambulatory Visit (HOSPITAL_BASED_OUTPATIENT_CLINIC_OR_DEPARTMENT_OTHER): Payer: Managed Care, Other (non HMO) | Admitting: Anesthesiology

## 2021-07-13 ENCOUNTER — Encounter (HOSPITAL_BASED_OUTPATIENT_CLINIC_OR_DEPARTMENT_OTHER): Payer: Self-pay | Admitting: Radiation Oncology

## 2021-07-13 ENCOUNTER — Ambulatory Visit
Admission: RE | Admit: 2021-07-13 | Discharge: 2021-07-13 | Disposition: A | Discharge status: Home / Self Care | Payer: Managed Care, Other (non HMO) | Source: Ambulatory Visit | Attending: Gynecologic Oncology | Admitting: Gynecologic Oncology

## 2021-07-13 ENCOUNTER — Other Ambulatory Visit: Payer: Self-pay

## 2021-07-13 ENCOUNTER — Encounter (HOSPITAL_BASED_OUTPATIENT_CLINIC_OR_DEPARTMENT_OTHER)
Admission: RE | Disposition: A | Discharge status: Other Acute Inpatient Hospital | Payer: Self-pay | Source: Home / Self Care | Attending: Radiation Oncology

## 2021-07-13 ENCOUNTER — Ambulatory Visit
Admission: RE | Admit: 2021-07-13 | Discharge: 2021-07-13 | Disposition: A | Discharge status: Home / Self Care | Payer: Managed Care, Other (non HMO) | Source: Ambulatory Visit | Attending: Radiation Oncology | Admitting: Radiation Oncology

## 2021-07-13 VITALS — BP 145/79 | HR 88 | Temp 97.4°F | Resp 18

## 2021-07-13 DIAGNOSIS — F32A Depression, unspecified: Secondary | ICD-10-CM | POA: Diagnosis not present

## 2021-07-13 DIAGNOSIS — D759 Disease of blood and blood-forming organs, unspecified: Secondary | ICD-10-CM | POA: Insufficient documentation

## 2021-07-13 DIAGNOSIS — C539 Malignant neoplasm of cervix uteri, unspecified: Secondary | ICD-10-CM

## 2021-07-13 DIAGNOSIS — F419 Anxiety disorder, unspecified: Secondary | ICD-10-CM | POA: Insufficient documentation

## 2021-07-13 DIAGNOSIS — D649 Anemia, unspecified: Secondary | ICD-10-CM | POA: Insufficient documentation

## 2021-07-13 DIAGNOSIS — R7303 Prediabetes: Secondary | ICD-10-CM | POA: Insufficient documentation

## 2021-07-13 DIAGNOSIS — Z01818 Encounter for other preprocedural examination: Secondary | ICD-10-CM

## 2021-07-13 DIAGNOSIS — C530 Malignant neoplasm of endocervix: Secondary | ICD-10-CM | POA: Insufficient documentation

## 2021-07-13 HISTORY — PX: TANDEM RING INSERTION: SHX6199

## 2021-07-13 HISTORY — PX: OPERATIVE ULTRASOUND: SHX5996

## 2021-07-13 LAB — RAD ONC ARIA SESSION SUMMARY
Course Elapsed Days: 91
Plan Fractions Treated to Date: 1
Plan Prescribed Dose Per Fraction: 5.5 Gy
Plan Total Fractions Prescribed: 1
Plan Total Prescribed Dose: 5.5 Gy
Reference Point Dosage Given to Date: 24.563 Gy
Reference Point Dosage Given to Date: 24.8915 Gy
Reference Point Dosage Given to Date: 27.5 Gy
Reference Point Dosage Given to Date: 52.6639 Gy
Reference Point Dosage Given to Date: 53.858 Gy
Reference Point Session Dosage Given: 3.803 Gy
Reference Point Session Dosage Given: 3.8071 Gy
Reference Point Session Dosage Given: 5.5 Gy
Reference Point Session Dosage Given: 8.4811 Gy
Reference Point Session Dosage Given: 8.654 Gy
Session Number: 35

## 2021-07-13 SURGERY — INSERTION, UTERINE TANDEM AND RING OR CYLINDER, FOR BRACHYTHERAPY
Anesthesia: General | Site: Pelvis

## 2021-07-13 MED ORDER — FENTANYL CITRATE (PF) 100 MCG/2ML IJ SOLN
INTRAMUSCULAR | Status: DC | PRN
  Administered 2021-07-13: 50 ug via INTRAVENOUS

## 2021-07-13 MED ORDER — PROPOFOL 10 MG/ML IV BOLUS
INTRAVENOUS | Status: AC
  Filled 2021-07-13: qty 20

## 2021-07-13 MED ORDER — MIDAZOLAM HCL 2 MG/2ML IJ SOLN
INTRAMUSCULAR | Status: AC
  Filled 2021-07-13: qty 2

## 2021-07-13 MED ORDER — HYDROMORPHONE HCL 1 MG/ML IJ SOLN
INTRAMUSCULAR | Status: AC
  Filled 2021-07-13: qty 1

## 2021-07-13 MED ORDER — MIDAZOLAM HCL 5 MG/5ML IJ SOLN
INTRAMUSCULAR | Status: DC | PRN
  Administered 2021-07-13: 2 mg via INTRAVENOUS

## 2021-07-13 MED ORDER — KETOROLAC TROMETHAMINE 30 MG/ML IJ SOLN
INTRAMUSCULAR | Status: DC | PRN
  Administered 2021-07-13: 30 mg via INTRAVENOUS

## 2021-07-13 MED ORDER — HYDROMORPHONE HCL 1 MG/ML IJ SOLN
0.5000 mg | Freq: Once | INTRAMUSCULAR | Status: AC
  Administered 2021-07-13: 0.5 mg via INTRAVENOUS
  Filled 2021-07-13: qty 1

## 2021-07-13 MED ORDER — ACETAMINOPHEN 160 MG/5ML PO SOLN: 325.0000 mg | ORAL | Status: DC | PRN

## 2021-07-13 MED ORDER — PROPOFOL 10 MG/ML IV BOLUS
INTRAVENOUS | Status: DC | PRN
  Administered 2021-07-13 (×2): 30 mg via INTRAVENOUS
  Administered 2021-07-13: 120 mg via INTRAVENOUS

## 2021-07-13 MED ORDER — PHENYLEPHRINE 80 MCG/ML (10ML) SYRINGE FOR IV PUSH (FOR BLOOD PRESSURE SUPPORT)
PREFILLED_SYRINGE | INTRAVENOUS | Status: DC | PRN
  Administered 2021-07-13: 160 ug via INTRAVENOUS

## 2021-07-13 MED ORDER — FENTANYL CITRATE (PF) 100 MCG/2ML IJ SOLN: 25.0000 ug | INTRAMUSCULAR | Status: DC | PRN

## 2021-07-13 MED ORDER — OXYCODONE HCL 5 MG/5ML PO SOLN: 5.0000 mg | Freq: Once | ORAL | Status: DC | PRN

## 2021-07-13 MED ORDER — ACETAMINOPHEN 325 MG PO TABS: 325.0000 mg | ORAL_TABLET | ORAL | Status: DC | PRN

## 2021-07-13 MED ORDER — KETOROLAC TROMETHAMINE 30 MG/ML IJ SOLN
INTRAMUSCULAR | Status: AC
  Filled 2021-07-13: qty 1

## 2021-07-13 MED ORDER — EPHEDRINE SULFATE-NACL 50-0.9 MG/10ML-% IV SOSY
PREFILLED_SYRINGE | INTRAVENOUS | Status: DC | PRN
  Administered 2021-07-13: 10 mg via INTRAVENOUS
  Administered 2021-07-13: 15 mg via INTRAVENOUS

## 2021-07-13 MED ORDER — LIDOCAINE HCL (PF) 2 % IJ SOLN
INTRAMUSCULAR | Status: AC
  Filled 2021-07-13: qty 5

## 2021-07-13 MED ORDER — DEXAMETHASONE SODIUM PHOSPHATE 10 MG/ML IJ SOLN
INTRAMUSCULAR | Status: DC | PRN
  Administered 2021-07-13: 10 mg via INTRAVENOUS

## 2021-07-13 MED ORDER — OXYCODONE HCL 5 MG PO TABS: 5.0000 mg | ORAL_TABLET | Freq: Once | ORAL | Status: DC | PRN

## 2021-07-13 MED ORDER — DEXAMETHASONE SODIUM PHOSPHATE 10 MG/ML IJ SOLN
INTRAMUSCULAR | Status: AC
  Filled 2021-07-13: qty 1

## 2021-07-13 MED ORDER — LACTATED RINGERS IV SOLN
INTRAVENOUS | Status: DC
  Filled 2021-07-13 (×2): qty 250

## 2021-07-13 MED ORDER — ONDANSETRON HCL 4 MG/2ML IJ SOLN
INTRAMUSCULAR | Status: AC
  Filled 2021-07-13: qty 2

## 2021-07-13 MED ORDER — LACTATED RINGERS IV SOLN: INTRAVENOUS | Status: DC

## 2021-07-13 MED ORDER — PHENYLEPHRINE 80 MCG/ML (10ML) SYRINGE FOR IV PUSH (FOR BLOOD PRESSURE SUPPORT)
PREFILLED_SYRINGE | INTRAVENOUS | Status: AC
  Filled 2021-07-13: qty 10

## 2021-07-13 MED ORDER — SODIUM CHLORIDE 0.9 % IR SOLN
Status: DC | PRN
  Administered 2021-07-13: 500 mL

## 2021-07-13 MED ORDER — SODIUM CHLORIDE 0.9 % IV SOLN
INTRAVENOUS | Status: AC | PRN
  Administered 2021-07-13: 250 mL

## 2021-07-13 MED ORDER — MEPERIDINE HCL 25 MG/ML IJ SOLN: 6.2500 mg | INTRAMUSCULAR | Status: DC | PRN

## 2021-07-13 MED ORDER — ONDANSETRON HCL 4 MG/2ML IJ SOLN
INTRAMUSCULAR | Status: DC | PRN
  Administered 2021-07-13: 4 mg via INTRAVENOUS

## 2021-07-13 MED ORDER — LIDOCAINE 2% (20 MG/ML) 5 ML SYRINGE
INTRAMUSCULAR | Status: DC | PRN
  Administered 2021-07-13: 60 mg via INTRAVENOUS

## 2021-07-13 MED ORDER — FENTANYL CITRATE (PF) 100 MCG/2ML IJ SOLN
INTRAMUSCULAR | Status: AC
  Filled 2021-07-13: qty 2

## 2021-07-13 MED ORDER — ONDANSETRON HCL 4 MG/2ML IJ SOLN: 4.0000 mg | Freq: Once | INTRAMUSCULAR | Status: DC | PRN

## 2021-07-13 MED ORDER — EPHEDRINE 5 MG/ML INJ
INTRAVENOUS | Status: AC
  Filled 2021-07-13: qty 5

## 2021-07-13 SURGICAL SUPPLY — 20 items
BNDG CMPR 75X21 PLY HI ABS (MISCELLANEOUS)
DILATOR CANAL MILEX (MISCELLANEOUS) IMPLANT
DRSG PAD ABDOMINAL 8X10 ST (GAUZE/BANDAGES/DRESSINGS) ×4 IMPLANT
DRSG TELFA 3X8 NADH (GAUZE/BANDAGES/DRESSINGS) ×3 IMPLANT
GAUZE 4X4 16PLY ~~LOC~~+RFID DBL (SPONGE) ×8 IMPLANT
GAUZE STRETCH 2X75IN STRL (MISCELLANEOUS) IMPLANT
GLOVE BIO SURGEON STRL SZ7.5 (GLOVE) ×8 IMPLANT
GOWN STRL REUS W/TWL LRG LVL3 (GOWN DISPOSABLE) ×4 IMPLANT
HOLDER FOLEY CATH W/STRAP (MISCELLANEOUS) ×4 IMPLANT
IV NS 1000ML (IV SOLUTION) ×3
IV NS 1000ML BAXH (IV SOLUTION) ×3 IMPLANT
IV SET EXTENSION GRAVITY 40 LF (IV SETS) ×4 IMPLANT
KIT TURNOVER CYSTO (KITS) ×4 IMPLANT
MAT PREVALON FULL STRYKER (MISCELLANEOUS) ×4 IMPLANT
PACK VAGINAL MINOR WOMEN LF (CUSTOM PROCEDURE TRAY) ×4 IMPLANT
PACKING VAGINAL (PACKING) IMPLANT
PAD DRESSING TELFA 3X8 NADH (GAUZE/BANDAGES/DRESSINGS) ×3 IMPLANT
TOWEL OR 17X26 10 PK STRL BLUE (TOWEL DISPOSABLE) ×4 IMPLANT
TRAY FOLEY W/BAG SLVR 14FR LF (SET/KITS/TRAYS/PACK) ×4 IMPLANT
WATER STERILE IRR 500ML POUR (IV SOLUTION) ×4 IMPLANT

## 2021-07-13 NOTE — Transfer of Care (Signed)
Immediate Anesthesia Transfer of Care Note  Patient: Robin Steele  Procedure(s) Performed: TANDEM RING INSERTION (Cervix) OPERATIVE ULTRASOUND (Pelvis)  Patient Location: PACU  Anesthesia Type:General  Level of Consciousness: drowsy, patient cooperative and responds to stimulation  Airway & Oxygen Therapy: Patient Spontanous Breathing  Post-op Assessment: Report given to RN and Post -op Vital signs reviewed and stable  Post vital signs: Reviewed and stable  Last Vitals:  Vitals Value Taken Time  BP 132/74 07/13/21 0810  Temp    Pulse 79 07/13/21 0811  Resp 14 07/13/21 0811  SpO2 99 % 07/13/21 0811  Vitals shown include unvalidated device data.  Last Pain:  Vitals:   07/13/21 0546  TempSrc: Oral  PainSc: 0-No pain      Patients Stated Pain Goal: 4 (03/40/35 2481)  Complications: No notable events documented.

## 2021-07-13 NOTE — Anesthesia Postprocedure Evaluation (Signed)
Anesthesia Post Note  Patient: Robin Steele  Procedure(s) Performed: TANDEM RING INSERTION (Cervix) OPERATIVE ULTRASOUND (Pelvis)     Patient location during evaluation: PACU Anesthesia Type: General Level of consciousness: awake and alert Pain management: pain level controlled Vital Signs Assessment: post-procedure vital signs reviewed and stable Respiratory status: spontaneous breathing, nonlabored ventilation, respiratory function stable and patient connected to nasal cannula oxygen Cardiovascular status: blood pressure returned to baseline and stable Postop Assessment: no apparent nausea or vomiting Anesthetic complications: no   No notable events documented.  Last Vitals:  Vitals:   07/13/21 0840 07/13/21 0845  BP:  138/75  Pulse: 77 81  Resp: 11 16  Temp:    SpO2: 100% 99%    Last Pain:  Vitals:   07/13/21 0840  TempSrc:   PainSc: 3                  [name]

## 2021-07-13 NOTE — Anesthesia Procedure Notes (Signed)
Procedure Name: LMA Insertion Date/Time: 07/13/2021 7:35 AM  Performed by: Rogers Blocker, CRNAPre-anesthesia Checklist: Patient identified, Emergency Drugs available, Suction available and Patient being monitored Patient Re-evaluated:Patient Re-evaluated prior to induction Oxygen Delivery Method: Circle System Utilized Preoxygenation: Pre-oxygenation with 100% oxygen Induction Type: IV induction Ventilation: Mask ventilation without difficulty LMA: LMA inserted LMA Size: 3.0 Number of attempts: 1 Placement Confirmation: positive ETCO2 Tube secured with: Tape Dental Injury: Teeth and Oropharynx as per pre-operative assessment

## 2021-07-13 NOTE — Progress Notes (Signed)
Patient given hydromorphone at 929am. Patient reports having an itching sensation to left arm that lasted only a few seconds. IV site intact, no noted redness or rash present to IV insertion site or left arm. IV infusing well. Patient denies any breathing issues. Will continue to monitor.

## 2021-07-13 NOTE — Interval H&P Note (Signed)
History and Physical Interval Note:  07/13/2021 7:27 AM  Robin Steele  has presented today for surgery, with the diagnosis of CERVICAL  CANCER.  The various methods of treatment have been discussed with the patient and family. After consideration of risks, benefits and other options for treatment, the patient has consented to  Procedure(s): TANDEM RING INSERTION (N/A) OPERATIVE ULTRASOUND (N/A) as a surgical intervention.  The patient's history has been reviewed, patient examined, no change in status, stable for surgery.  I have reviewed the patient's chart and labs.  Questions were answered to the patient's satisfaction.     Gery Pray

## 2021-07-13 NOTE — Op Note (Signed)
07/13/2021  8:42 AM  PATIENT:  Robin Steele  56 y.o. female  PRE-OPERATIVE DIAGNOSIS:  CERVICAL  CANCER  POST-OPERATIVE DIAGNOSIS:  CERVICAL  CANCER  PROCEDURE:  Procedure(s): TANDEM RING INSERTION (N/A) OPERATIVE ULTRASOUND (N/A)  SURGEON:  Surgeon(s) and Role:    * Gery Pray, MD - Primary  PHYSICIAN ASSISTANT:   ASSISTANTS: none   ANESTHESIA:   general  EBL:  2 mL   BLOOD ADMINISTERED:none  DRAINS: Urinary Catheter (Foley)   LOCAL MEDICATIONS USED:  NONE  SPECIMEN:  No Specimen  DISPOSITION OF SPECIMEN:  N/A  COUNTS:  correct, yes  TOURNIQUET:  * No tourniquets in log *  DICTATION: .Patient was taken to the [place] [facility] operating room #1  Patient was prepped and draped in the usual sterile fashion and placed in the dorsolithotomy position.  Timeout for the procedure as well as preoperative medications and allergies was performed.  A Foley catheter was inserted and backfilled with approximately 250 cc of sterile water for ultrasound imaging purposes.  Patient then proceeded to undergo exam under anesthesia.  The cervical os was flush with the upper vaginal vault.  No fornices were noted.    On bimanual examination the cervical mass appeared to be normal in size.  There is no parametrial extension noted.  Some erythema was noted to the anterior lip of the cervical area.  The cervical sleeve placed last week remained in place and therfore no dilation of the cervical os was necessary. the uterus was anteverted.  On ultrasound there was no bulky cervical mass appreciated.   she  had placement of  a 40 mm 60 degree tandem with cervical sleeve in place.  She  then had a 60 degree cervical ring with a small shielding which was then attached to the tandem.  This was then followed by placement of a rectal paddle posteriorly to limit dose to the rectal mucosa with her high-dose-rate treatment.  She tolerated the procedure well.  Later in the day the patient will be  brought down to radiation oncology for planning and her fifth high-dose-rate treatment.  She is to receive 5.5 Pearline Cables to the high risk clinical target volume.  Iridium 192 will be the high-dose-rate source.  After today's treatment this will complete her definitive course of radiation therapy.  PLAN OF CARE:  Transfer to radiation oncology for planning and treatment  PATIENT DISPOSITION:  PACU - hemodynamically stable.   Delay start of Pharmacological VTE agent (>24hrs) due to surgical blood loss or risk of bleeding: not applicable

## 2021-07-14 ENCOUNTER — Other Ambulatory Visit (HOSPITAL_COMMUNITY): Payer: Managed Care, Other (non HMO)

## 2021-07-14 ENCOUNTER — Ambulatory Visit: Payer: Managed Care, Other (non HMO) | Admitting: Radiation Oncology

## 2021-07-14 ENCOUNTER — Encounter (HOSPITAL_BASED_OUTPATIENT_CLINIC_OR_DEPARTMENT_OTHER): Payer: Self-pay | Admitting: Radiation Oncology

## 2021-07-14 ENCOUNTER — Telehealth: Payer: Self-pay | Admitting: *Deleted

## 2021-07-14 NOTE — Telephone Encounter (Signed)
CALLED PATIENT TO ASK ABOUT SCHEDULING FU APPT., PATIENT AGREED TO COME ON 08-21-21 @ 10:45 AM

## 2021-07-18 ENCOUNTER — Other Ambulatory Visit: Payer: Managed Care, Other (non HMO)

## 2021-07-18 ENCOUNTER — Ambulatory Visit: Payer: Managed Care, Other (non HMO) | Admitting: Hematology and Oncology

## 2021-07-24 ENCOUNTER — Inpatient Hospital Stay (HOSPITAL_BASED_OUTPATIENT_CLINIC_OR_DEPARTMENT_OTHER): Payer: Managed Care, Other (non HMO) | Admitting: Hematology and Oncology

## 2021-07-24 ENCOUNTER — Inpatient Hospital Stay: Payer: Managed Care, Other (non HMO) | Attending: Hematology and Oncology

## 2021-07-24 ENCOUNTER — Other Ambulatory Visit: Payer: Self-pay

## 2021-07-24 ENCOUNTER — Encounter: Payer: Self-pay | Admitting: Hematology and Oncology

## 2021-07-24 VITALS — BP 127/83 | HR 68 | Temp 98.1°F | Resp 16 | Ht 65.0 in | Wt 142.8 lb

## 2021-07-24 DIAGNOSIS — R197 Diarrhea, unspecified: Secondary | ICD-10-CM | POA: Insufficient documentation

## 2021-07-24 DIAGNOSIS — D61818 Other pancytopenia: Secondary | ICD-10-CM | POA: Diagnosis not present

## 2021-07-24 DIAGNOSIS — C539 Malignant neoplasm of cervix uteri, unspecified: Secondary | ICD-10-CM | POA: Insufficient documentation

## 2021-07-24 LAB — CBC WITH DIFFERENTIAL (CANCER CENTER ONLY)
Abs Immature Granulocytes: 0.02 10*3/uL (ref 0.00–0.07)
Basophils Absolute: 0 10*3/uL (ref 0.0–0.1)
Basophils Relative: 1 %
Eosinophils Absolute: 1.5 10*3/uL — ABNORMAL HIGH (ref 0.0–0.5)
Eosinophils Relative: 36 %
HCT: 32.6 % — ABNORMAL LOW (ref 36.0–46.0)
Hemoglobin: 10.7 g/dL — ABNORMAL LOW (ref 12.0–15.0)
Immature Granulocytes: 1 %
Lymphocytes Relative: 20 %
Lymphs Abs: 0.8 10*3/uL (ref 0.7–4.0)
MCH: 28.9 pg (ref 26.0–34.0)
MCHC: 32.8 g/dL (ref 30.0–36.0)
MCV: 88.1 fL (ref 80.0–100.0)
Monocytes Absolute: 0.3 10*3/uL (ref 0.1–1.0)
Monocytes Relative: 6 %
Neutro Abs: 1.6 10*3/uL — ABNORMAL LOW (ref 1.7–7.7)
Neutrophils Relative %: 36 %
Platelet Count: 181 10*3/uL (ref 150–400)
RBC: 3.7 MIL/uL — ABNORMAL LOW (ref 3.87–5.11)
RDW: 15 % (ref 11.5–15.5)
WBC Count: 4.3 10*3/uL (ref 4.0–10.5)
nRBC: 0 % (ref 0.0–0.2)

## 2021-07-24 LAB — BASIC METABOLIC PANEL - CANCER CENTER ONLY
Anion gap: 5 (ref 5–15)
BUN: 8 mg/dL (ref 6–20)
CO2: 29 mmol/L (ref 22–32)
Calcium: 9.4 mg/dL (ref 8.9–10.3)
Chloride: 107 mmol/L (ref 98–111)
Creatinine: 0.84 mg/dL (ref 0.44–1.00)
GFR, Estimated: >60 mL/min (ref >60)
Glucose, Bld: 129 mg/dL — ABNORMAL HIGH (ref 70–99)
Potassium: 3.4 mmol/L — ABNORMAL LOW (ref 3.5–5.1)
Sodium: 141 mmol/L (ref 135–145)

## 2021-07-24 LAB — MAGNESIUM: Magnesium: 2 mg/dL (ref 1.7–2.4)

## 2021-07-24 MED ORDER — SODIUM CHLORIDE 0.9% FLUSH
10.0000 mL | Freq: Once | INTRAVENOUS | Status: AC
  Administered 2021-07-24: 10 mL

## 2021-07-24 MED ORDER — HEPARIN SOD (PORK) LOCK FLUSH 100 UNIT/ML IV SOLN
500.0000 [IU] | Freq: Once | INTRAVENOUS | Status: AC
  Administered 2021-07-24: 500 [IU]

## 2021-07-24 NOTE — Assessment & Plan Note (Signed)
Overall, she is recovering from side effects of treatment She has mild persistent anemia and loose stool but otherwise felt great Plan to repeat PET/CT imaging in October I will see her back afterwards and if she have complete response to treatment, we will get her port removed

## 2021-07-24 NOTE — Progress Notes (Signed)
[place] OFFICE PROGRESS NOTE  Patient Care Team: Rosine Door as PCP - General (Physician Assistant) Janina Mayo, MD as PCP - Cardiology (Cardiology) Pleas Koch Virgina Evener, MD as Physician Assistant (Family Medicine)  ASSESSMENT & PLAN:  Cervical adenocarcinoma [facility]) Overall, she is recovering from side effects of treatment She has mild persistent anemia and loose stool but otherwise felt great Plan to repeat PET/CT imaging in October I will see her back afterwards and if she have complete response to treatment, we will get her port removed  Diarrhea She continues to have mild loose stool She will take Imodium as needed  Acquired pancytopenia ([place]) Her pancytopenia is improving Observe closely  Orders Placed This Encounter  Procedures   NM PET Image Restage (PS) Skull Base to Thigh (F-18 FDG)    Standing Status:   Future    Standing Expiration Date:   07/25/2022    Order Specific Question:   If indicated for the ordered procedure, I authorize the administration of a radiopharmaceutical per Radiology protocol    Answer:   Yes    Order Specific Question:   Preferred imaging location?    Answer:   [facility]    Order Specific Question:   Radiology Contrast Protocol - do NOT remove file path    Answer:   [email or web address]    Order Specific Question:   Is the patient pregnant?    Answer:   No    All questions were answered. The patient knows to call the clinic with any problems, questions or concerns. The total time spent in the appointment was 20 minutes encounter with patients including review of chart and various tests results, discussions about plan of care and coordination of care plan   Heath Lark, MD 07/24/2021 12:41 PM  INTERVAL HISTORY: Please see below for problem oriented charting. she returns for follow-up She has completed radiation therapy 2 weeks ago She has mild loose stool She  have weird throat discomfort but otherwise no new symptoms  REVIEW OF SYSTEMS:   Constitutional: Denies fevers, chills or abnormal weight loss Eyes: Denies blurriness of vision Ears, nose, mouth, throat, and face: Denies mucositis or sore throat Respiratory: Denies cough, dyspnea or wheezes Cardiovascular: Denies palpitation, chest discomfort or lower extremity swelling Skin: Denies abnormal skin rashes Lymphatics: Denies new lymphadenopathy or easy bruising Neurological:Denies numbness, tingling or new weaknesses Behavioral/Psych: Mood is stable, no new changes  All other systems were reviewed with the patient and are negative.  I have reviewed the past medical history, past surgical history, social history and family history with the patient and they are unchanged from previous note.  ALLERGIES:  has No Known Allergies.  MEDICATIONS:  Current Outpatient Medications  Medication Sig Dispense Refill   Ascorbic Acid (VITAMIN C) 1000 MG tablet Take 1,000 mg by mouth daily.     cholecalciferol (VITAMIN D3) 25 MCG (1000 UNIT) tablet Take 2,000 Units by mouth daily.     Cyanocobalamin (B-12) 5000 MCG CAPS Take 5,000 mcg by mouth daily.     dexamethasone (DECADRON) 4 MG tablet Take 1 tablet (4 mg total) by mouth daily. (Patient taking differently: Take 4 mg by mouth as needed.) 14 tablet 0   DULoxetine (CYMBALTA) 60 MG capsule Take by mouth at bedtime.     EYSUVIS 0.25 % SUSP Apply to eye in the morning, at noon, and at bedtime.     lidocaine-prilocaine (EMLA) cream Apply to affected area once (Patient taking differently: as  needed. Apply to affected area once) 30 g 3   Loperamide HCl (IMODIUM PO) Take 2 capsules by mouth as needed.     Magnesium Oxide 250 MG TABS Take 1 tablet by mouth every morning.     Multiple Vitamins-Minerals (CENTRUM SILVER 50+WOMEN) TABS Take 1 tablet by mouth daily.     Omega-3 1000 MG CAPS Take 1,000 mg by mouth daily.     ondansetron (ZOFRAN) 8 MG tablet Take 1  tablet by mouth 2  times daily as needed. Start on the third day after cisplatin chemotherapy. 30 tablet 1   prochlorperazine (COMPAZINE) 10 MG tablet Take 1 tablet  by mouth every 6  hours as needed (Nausea or vomiting). (Patient taking differently: Take 10 mg by mouth.) 30 tablet 1   Red Yeast Rice 600 MG TABS Take 1,200 mg by mouth daily.     temazepam (RESTORIL) 30 MG capsule Take by mouth at bedtime.     valACYclovir (VALTREX) 500 MG tablet Take 500 mg by mouth at bedtime.     No current facility-administered medications for this visit.    SUMMARY OF ONCOLOGIC HISTORY: Oncology History  Cervical adenocarcinoma ([place])  09/14/2020 Initial Diagnosis   The patient has a history of prior abnormal paps, did not require biopsies are procedures, normalized. Has previously been HPV+.  More recent history is as follows: Pap 08/2020: ASC-H, HR HPV positive Colposcopy 09/14/20: ECC negative, CIN3 on cervical biopsies   02/15/2021 Pathology Results   [phone]  Cone biopsy of cervix showed invasive adenocarcinoma, 2.1 cm in maximum dimension, 1.5 cm depth of invasion.  Endocervical and deep margins are involved.  High-grade squamous intraepithelial lesion with involvement of endocervical glands were present.  Endocervix curettage show detached fragment of adenocarcinoma. P16 is strongly positive.   03/09/2021 Initial Diagnosis   Cervical adenocarcinoma ([place])   03/24/2021 PET scan   1. Moderate activity in the cervix with maximum SUV of 5.6. Some of this could be postprocedural/inflammatory related to recent conization, but residual tumor is not excluded. No findings of distant metastatic spread or hypermetabolic adenopathy.   03/30/2021 Imaging   MRI pelvis Repeat imaging was performed with intersection again showing the tumor biased towards the anterior aspect of the cervix but tracking towards the RIGHT posterolateral cervix with stromal disruption at the level of the inferior posterolateral cervix on  the RIGHT (image 19/40)   Contrasted imaging (image 20/44) this shows contrast enhancement which follows this pattern. Early extension into the RIGHT posterolateral parametrium at the lower margin of the cervix is demonstrated on both contrasted and precontrast T2 weighted imaging. The lesion involves RIGHT anterolateral 2/3 of the cervix with loss of endocervical distinction noted on image 19 of 40 across the entire interface between the mass and the cervix, cervical disruption of stroma noted RIGHT posterolateral aspect as described.   Ovaries are normal.   Final impression: Cervical neoplasm with extension into cervical stroma and disruption of cervical stroma most notably along the RIGHT posterolateral margin with early extension into the RIGHT parametrium.     03/31/2021 Cancer Staging   Staging form: Cervix Uteri, AJCC Version 9 - Clinical stage from 03/31/2021: Stage IIB (cT2b, cN0, cM0) - Signed by Heath Lark, MD on 03/31/2021 Stage prefix: Initial diagnosis   04/12/2021 - 05/10/2021 Chemotherapy   Patient is on Treatment Plan : Cervical Cisplatin q7d     04/12/2021 Procedure   Placement of single lumen port a cath via right internal jugular vein. The catheter tip lies  at the cavo-atrial junction. A power injectable port a cath was placed and is ready for immediate use.   05/25/2021 Genetic Testing   Negative hereditary cancer genetic testing: no pathogenic variants detected in [facility] [employer]Next-Cancer +RNAinsight Panel.  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 Report date is May 25, 2021.   The CustomNext-Cancer+RNAinsight panel offered by [facility] includes sequencing and rearrangement analysis for the following 47 genes:  APC, ATM, AXIN2, BARD1, BMPR1A, BRCA1, BRCA2, BRIP1, CDH1, CDK4, CDKN2A, CHEK2, DICER1, EPCAM, GREM1, HOXB13, MEN1, MLH1, MSH2, MSH3, MSH6, MUTYH, NBN, NF1, NF2, NTHL1, PALB2, PMS2, POLD1, POLE, PTEN, RAD51C, RAD51D, RECQL, RET, SDHA,  SDHAF2, SDHB, SDHC, SDHD, SMAD4, SMARCA4, STK11, TP53, TSC1, TSC2, and VHL.  RNA data is routinely analyzed for use in variant interpretation for all genes.     PHYSICAL EXAMINATION: ECOG PERFORMANCE STATUS: 1 - Symptomatic but completely ambulatory  Vitals:   07/24/21 1211  BP: 127/83  Pulse: 68  Resp: 16  Temp: 98.1 F (36.7 C)  SpO2: 100%   Filed Weights   07/24/21 1211  Weight: 142 lb 12.8 oz (64.8 kg)    GENERAL:alert, no distress and comfortable NEURO: alert & oriented x 3 with fluent speech, no focal motor/sensory deficits  LABORATORY DATA:  I have reviewed the data as listed    Component Value Date/Time   NA 141 07/24/2021 1201   K 3.4 (L) 07/24/2021 1201   CL 107 07/24/2021 1201   CO2 29 07/24/2021 1201   GLUCOSE 129 (H) 07/24/2021 1201   BUN 8 07/24/2021 1201   CREATININE 0.84 07/24/2021 1201   CALCIUM 9.4 07/24/2021 1201   GFRNONAA >60 07/24/2021 1201    No results found for: "SPEP", "UPEP"  Lab Results  Component Value Date   WBC 4.3 07/24/2021   NEUTROABS 1.6 (L) 07/24/2021   HGB 10.7 (L) 07/24/2021   HCT 32.6 (L) 07/24/2021   MCV 88.1 07/24/2021   PLT 181 07/24/2021      Chemistry      Component Value Date/Time   NA 141 07/24/2021 1201   K 3.4 (L) 07/24/2021 1201   CL 107 07/24/2021 1201   CO2 29 07/24/2021 1201   BUN 8 07/24/2021 1201   CREATININE 0.84 07/24/2021 1201      Component Value Date/Time   CALCIUM 9.4 07/24/2021 1201

## 2021-07-24 NOTE — Assessment & Plan Note (Signed)
Her pancytopenia is improving Observe closely

## 2021-07-24 NOTE — Assessment & Plan Note (Signed)
She continues to have mild loose stool She will take Imodium as needed

## 2021-08-09 ENCOUNTER — Encounter: Payer: Self-pay | Admitting: Radiation Oncology

## 2021-08-09 ENCOUNTER — Telehealth: Payer: Self-pay | Admitting: *Deleted

## 2021-08-09 NOTE — Telephone Encounter (Signed)
Pt is scheduled for a follow up appointment with Dr.Tucker on November 14 @ 3:45. Pt states she needs latest appointment time.

## 2021-08-09 NOTE — Telephone Encounter (Signed)
Per Dr Alvy Bimler and Dr Berline Lopes patient needs a follow up appt with Dr Berline Lopes in November

## 2021-08-14 ENCOUNTER — Ambulatory Visit: Payer: Managed Care, Other (non HMO) | Admitting: Radiation Oncology

## 2021-08-19 NOTE — Progress Notes (Incomplete)
  Radiation Oncology         [phone]) [phone] ________________________________  Patient Name: Robin Steele MRN: [id number] DOB: 11/01/1965 Referring Physician: Jeral Pinch Date of Service: 07/13/2021 [facility], [place]                                                        End Of Treatment Note  Diagnoses: C53.9-Malignant neoplasm of cervix uteri, unspecified  Cancer Staging: The encounter diagnosis was Cervical adenocarcinoma ([place]).   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 +)   Cancer Staging  Cervical adenocarcinoma ([facility]) Staging form: Cervix Uteri, AJCC Version 9 - Clinical stage from 03/31/2021: Stage IIB (cT2b, cN0, cM0) - Signed by Heath Lark, MD on 03/31/2021  Intent: Curative  Radiation Treatment Dates: 04/13/2021 through 07/13/2021 (Pelvic IMRT: 04/13/21 through 05/30/21) (HDR: 06/01/21 through 07/13/21) Site Technique Total Dose (Gy) Dose per Fx (Gy) Completed Fx Beam Energies  Pelvis: Pelvis IMRT 45/45 1.8 25/25 6X  Pelvis: Pelvis_Bst 3D 9/9 1.8 5/5 15X  Cervix: Cervix_Bst_Fx1 HDR-brachy 5.5/5.5 5.5 1/1 Ir-192  Cervix: Cervix_Bst_Fx2 HDR-brachy 5.5/5.5 5.5 1/1 Ir-192  Cervix: Cervix_Bst_Fx3 HDR-brachy 5.5/5.5 5.5 1/1 Ir-192  Cervix: Cervix_Bst_Fx4 HDR-brachy 5.5/5.5 5.5 1/1 Ir-192  Cervix: Cervix_Bst_Fx5 HDR-brachy 5.5/5.5 5.5 1/1 Ir-192   Narrative: The patient tolerated her external beam and brachytherapy treatments quite well. On the date of her final pelvic IMRT treatment, the patient reported mild nausea, mild diarrhea, and occasional urinary leakage/pressure. She denied any vaginal bleeding.   Plan: The patient will follow-up with radiation oncology in one month .  ________________________________________________ -----------------------------------  Blair Promise, PhD, MD  This document serves as a record of services personally performed by Gery Pray, MD. It was created on his behalf by Roney Mans, a trained medical scribe. The  creation of this record is based on the scribe's personal observations and the provider's statements to them. This document has been checked and approved by the attending provider.

## 2021-08-19 NOTE — Progress Notes (Signed)
Radiation Oncology         [phone] ________________________________  Name: Robin Steele MRN: [id number]  Date: 08/21/2021  DOB: 1965/12/12  Follow-Up Visit Note  CC: Rosalee Kaufman, PA-C  Lafonda Mosses, MD  No diagnosis found.  Diagnosis: The encounter diagnosis was Cervical adenocarcinoma ([place]).   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 +)   Cancer Staging  Cervical adenocarcinoma ([facility]) Staging form: Cervix Uteri, AJCC Version 9 - Clinical stage from 03/31/2021: Stage IIB (cT2b, cN0, cM0) - Signed by Heath Lark, MD on 03/31/2021  Interval Since Last Radiation: 1 month and 8 days   Intent: Curative  Radiation Treatment Dates: 04/13/2021 through 07/13/2021 (Pelvic IMRT: 04/13/21 through 05/30/21) (HDR: 06/01/21 through 07/13/21) Site Technique Total Dose (Gy) Dose per Fx (Gy) Completed Fx Beam Energies  Pelvis: Pelvis IMRT 45/45 1.8 25/25 6X  Pelvis: Pelvis_Bst 3D 9/9 1.8 5/5 15X  Cervix: Cervix_Bst_Fx1 HDR-brachy 5.5/5.5 5.5 1/1 Ir-192  Cervix: Cervix_Bst_Fx2 HDR-brachy 5.5/5.5 5.5 1/1 Ir-192  Cervix: Cervix_Bst_Fx3 HDR-brachy 5.5/5.5 5.5 1/1 Ir-192  Cervix: Cervix_Bst_Fx4 HDR-brachy 5.5/5.5 5.5 1/1 Ir-192  Cervix: Cervix_Bst_Fx5 HDR-brachy 5.5/5.5 5.5 1/1 Ir-192    Narrative:  The patient returns today for routine follow-up. The patient tolerated her external beam and brachytherapy treatments quite well. On the date of her final pelvic IMRT treatment, the patient reported mild nausea, mild diarrhea, and occasional urinary leakage/pressure. She denied any vaginal bleeding.         In the interval since her initial consultation date, the patient completed chemotherapy consisting of cisplatin on 05/10/21 under the care of Dr. Alvy Bimler. The patient tolerated her last cycle of systemic treatment better than her prior cycles due to dexamethasone being added to control her nausea. Other chemo toxicities encountered by the patient included pancytopenia and anemia. Per  her most recent follow up visit with Dr. Alvy Bimler on 07/24/21, the patient was noted to be recovering well from systemic treatment other than mild persistent anemia and loose stools. Her pancytopenia was also noted to be improving.                    The patient opted to pursue genetic testing on 05/08/21. Results showed no clinically significant variants detected. 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    Of note: Patient has a restaging PET scan scheduled for 10/13/21. She will follow up with Dr. Alvy Bimler around that time as well.   ***  Allergies:  has No Known Allergies.  Meds: Current Outpatient Medications  Medication Sig Dispense Refill   Ascorbic Acid (VITAMIN C) 1000 MG tablet Take 1,000 mg by mouth daily.     cholecalciferol (VITAMIN D3) 25 MCG (1000 UNIT) tablet Take 2,000 Units by mouth daily.     Cyanocobalamin (B-12) 5000 MCG CAPS Take 5,000 mcg by mouth daily.     dexamethasone (DECADRON) 4 MG tablet Take 1 tablet (4 mg total) by mouth daily. (Patient taking differently: Take 4 mg by mouth as needed.) 14 tablet 0   DULoxetine (CYMBALTA) 60 MG capsule Take by mouth at bedtime.     EYSUVIS 0.25 % SUSP Apply to eye in the morning, at noon, and at bedtime.     lidocaine-prilocaine (EMLA) cream Apply to affected area once (Patient taking differently: as needed. Apply to affected area once) 30 g 3   Loperamide HCl (IMODIUM PO) Take 2 capsules by mouth as needed.     Magnesium Oxide  250 MG TABS Take 1 tablet by mouth every morning.     Multiple Vitamins-Minerals (CENTRUM SILVER 50+WOMEN) TABS Take 1 tablet by mouth daily.     Omega-3 1000 MG CAPS Take 1,000 mg by mouth daily.     ondansetron (ZOFRAN) 8 MG tablet Take 1 tablet by mouth 2  times daily as needed. Start on the third day after cisplatin chemotherapy. 30 tablet 1   prochlorperazine (COMPAZINE) 10 MG tablet Take 1 tablet  by mouth every 6  hours as needed  (Nausea or vomiting). (Patient taking differently: Take 10 mg by mouth.) 30 tablet 1   Red Yeast Rice 600 MG TABS Take 1,200 mg by mouth daily.     temazepam (RESTORIL) 30 MG capsule Take by mouth at bedtime.     valACYclovir (VALTREX) 500 MG tablet Take 500 mg by mouth at bedtime.     No current facility-administered medications for this encounter.    Physical Findings: The patient is in no acute distress. Patient is alert and oriented.  vitals were not taken for this visit. .  No significant changes. Lungs are clear to auscultation bilaterally. Heart has regular rate and rhythm. No palpable cervical, supraclavicular, or axillary adenopathy. Abdomen soft, non-tender, normal bowel sounds.   Lab Findings: Lab Results  Component Value Date   WBC 4.3 07/24/2021   HGB 10.7 (L) 07/24/2021   HCT 32.6 (L) 07/24/2021   MCV 88.1 07/24/2021   PLT 181 07/24/2021    Radiographic Findings: No results found.  Impression:  The encounter diagnosis was Cervical adenocarcinoma ([place]).   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 +)   The patient is recovering from the effects of radiation.  ***  Plan:  ***   *** minutes of total time was spent for this patient encounter, including preparation, face-to-face counseling with the patient and coordination of care, physical exam, and documentation of the encounter. ____________________________________  Blair Promise, PhD, MD  This document serves as a record of services personally performed by Gery Pray, MD. It was created on his behalf by Roney Mans, a trained medical scribe. The creation of this record is based on the scribe's personal observations and the provider's statements to them. This document has been checked and approved by the attending provider.

## 2021-08-20 NOTE — H&P (Signed)
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 female who has been undergoing external beam and radiosensitizing chemotherapy as initial treatment for her stage IIb adenocarcinoma of the cervix.  She completed this therapy on May 23.  Patient is now ready to proceed with brachytherapy to complete her definitive course of radiation treatment.  The patient has a history of abnormal Pap smears, none of which required biopsies or procedures in the past until a Pap performed in August of 2022 showing HPV positive ASCUS. Subsequently, the patient underwent a colposcopy at [facility] on 09/14/20. 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   Upon further discussion with Dr. Addison Bailey at [facility], the patient agreed to proceed with CKC on 02/15/21. Pathology from the procedure revealed grade 2 invasive adenocarcinoma (p16 strongly positive) measuring 2.1 cm in the max diameter, with the endocervical and deep margins involved. Path also showed AIS and HGSIL with involvement of endocervical glands. Endocervical curettage also showed a detatched fragment of adenocarcinoma.   Subsequently, the patient was referred to Dr. Berline Lopes on 03/09/21 for further management. During this visit, the patient reported being on birth control pills since the age of 32, and that her menses became much lighter and less frequent several years ago. Around 1 year ago, she stated that she had her hormone levels tested which showed signs of menopause, prompting her to stop oral contraception around that time.  The patient was also noted to report light pink discharge following intercourse. Since her CKC, the patient also reported some vaginal bleeding, but denied any pelvic pain. Physical exam performed during this visit revealed ongoing healing consistent with recent CKC, and complete loss of tissue plane between the posterior cervix and vagina. No visible lesions were noted. Further work-up was discussed with the patient including PET to rule out metastatic disease.   Subsequent PET on 03/23/21 showed moderate activity in the cervix with maximum SUV of 5.6. Activity appreciated was noted as possibly postprocedural/inflammatory, related to recent conization, but residual tumor could not be excluded. Otherwise, PER showed no evidence of distant metastatic spread or hypermetabolic adenopathy.   MRI of the pelvis on 03/27/21 demonstrated signs of cervical neoplasm along the anterior and right lateral cervix, with potential extension into the right parametrium disrupting the internal endocervical boundaries, and involvement of cervical stroma. No signs of adenopathy were appreciated in the pelvis. MRI of the pelvis was repeated on 03/30/21 again showing the cervical neoplasm extending into cervical stroma, with disruption of cervical stroma most notably along the right posterolateral margin with early extension into the right parametrium.  Given no evidence of metastatic disease, the patient was referred to Dr. Alvy Bimler on 03/31/21 to discuss systemic treatment options. Following discussion of the risks and benefits, the patient agreed to proceed with chemotherapy consisting of weekly cisplatin for x 5-6 doses, along with concurrent RT.   Of note: The patient underwent cervical spinal fusion on 11/18/20 under the care of Dr. Lorin Mercy. Patient also had her most recent screening mammogram on 03/22/21 which showed no evidence of malignancy in either breast.    PREVIOUS RADIATION THERAPY:  No  PAST MEDICAL HISTORY:   Past Medical History:  Diagnosis Date   Anxiety    Arthritis 2011   in neck and back   Cervical cancer ([place])    Depression    Difficulty sleeping    Dry eyes    Family history of breast cancer 05/08/2021   Family history of colon cancer 05/08/2021   Family history of uterine cancer 05/08/2021   History of basal cell carcinoma (BCC) excision 2022   skin cancer, 2 areas removed per pt   History of COVID-19    01-2020 and 01-2021 all symptoms resolved   History of radiation therapy    Cervix- 04/23/21-07/13/21- Dr. Gery Pray   Hyperlipidemia    Pre-diabetes 2022   borderline, hgba1c 5.7 in august 2022    PAST SURGICAL HISTORY: Past Surgical History:  Procedure Laterality Date   ANTERIOR CERVICAL DECOMP/DISCECTOMY FUSION N/A 11/18/2020   Procedure: CERVICAL FOUR -CERVICAL FIVE  ANTERIOR CERVICAL DISCECTOMY FUSION, ALLOGRAFT, PLATE;  Surgeon: Marybelle Killings, MD;  Location: [place];  Service: Orthopedics;  Laterality: N/A;   BASAL CELL CARCINOMA EXCISION  2022   pt has had areas removed in the past as well   CERVICAL CONE BIOPSY  02/15/2021   HERNIA REPAIR     pt states she had an umbilical hernia repair about 25 years ago (around 1997), unsure if repaired with mesh   IR IMAGING GUIDED PORT INSERTION  04/11/2021   OPERATIVE ULTRASOUND N/A 06/01/2021   Procedure: OPERATIVE ULTRASOUND;  Surgeon: Gery Pray, MD;  Location: [facility];  Service: Urology;  Laterality: N/A;   OPERATIVE ULTRASOUND N/A 06/19/2021   Procedure: OPERATIVE ULTRASOUND;  Surgeon: Gery Pray, MD;  Location: [facility];  Service: Urology;  Laterality: N/A;   OPERATIVE ULTRASOUND N/A 06/29/2021   Procedure: OPERATIVE ULTRASOUND;  Surgeon: Gery Pray, MD;  Location: [facility];  Service: Urology;  Laterality: N/A;   OPERATIVE ULTRASOUND N/A 07/06/2021   Procedure: OPERATIVE ULTRASOUND;  Surgeon: Gery Pray, MD;  Location: [facility];  Service:  Urology;  Laterality: N/A;   OPERATIVE ULTRASOUND N/A 07/13/2021   Procedure: OPERATIVE ULTRASOUND;  Surgeon: Gery Pray, MD;  Location: [facility];  Service: Urology;  Laterality: N/A;   TANDEM RING INSERTION N/A 06/01/2021   Procedure: TANDEM RING INSERTION;  Surgeon: Gery Pray, MD;  Location: [facility];  Service: Urology;  Laterality: N/A;   TANDEM RING INSERTION N/A 06/19/2021   Procedure: TANDEM RING INSERTION;  Surgeon: Gery Pray, MD;  Location: [facility];  Service: Urology;  Laterality: N/A;   TANDEM RING INSERTION N/A 06/29/2021   Procedure: TANDEM RING INSERTION;  Surgeon: Gery Pray, MD;  Location: [facility];  Service: Urology;  Laterality: N/A;   TANDEM RING INSERTION N/A 07/06/2021   Procedure: TANDEM RING INSERTION;  Surgeon: Gery Pray, MD;  Location: [facility];  Service: Urology;  Laterality: N/A;   TANDEM RING INSERTION N/A 07/13/2021   Procedure: TANDEM RING INSERTION;  Surgeon: Gery Pray, MD;  Location: [facility];  Service: Urology;  Laterality: N/A;    FAMILY HISTORY:  Family History  Problem Relation Age of Onset   Cancer Mother        maligant tumor on arm; dx 54s   Endometrial cancer Mother        dx 56s   Breast cancer Maternal Aunt        dx unknown age  Colon cancer Maternal Grandmother        dx unknown age   Uterine cancer Half-Sister        paternal half sister; dx before age 50   Skin cancer Half-Sister        ? melanoma; d. 53   Cervical cancer Other        mother's maternal half sister; dx after 47   Colon cancer Other        MGM's mother; dx unknown age   Prostate cancer Neg Hx    Ovarian cancer Neg Hx     SOCIAL HISTORY:  Social History   Tobacco Use   Smoking status: Never   Smokeless tobacco: Never  Vaping Use   Vaping Use: Never used  Substance Use Topics   Alcohol use: Yes    Comment: very occasional, maybe once or  twice a year (1 glass of wine)   Drug use: Never  Works full-time at [employer].  She does work remotely at this time.  ALLERGIES: No Known Allergies  MEDICATIONS:  No current facility-administered medications for this encounter.   Current Outpatient Medications  Medication Sig Dispense Refill   Ascorbic Acid (VITAMIN C) 1000 MG tablet Take 1,000 mg by mouth daily.     cholecalciferol (VITAMIN D3) 25 MCG (1000 UNIT) tablet Take 2,000 Units by mouth daily.     Cyanocobalamin (B-12) 5000 MCG CAPS Take 5,000 mcg by mouth daily.     dexamethasone (DECADRON) 4 MG tablet Take 1 tablet (4 mg total) by mouth daily. (Patient taking differently: Take 4 mg by mouth as needed.) 14 tablet 0   DULoxetine (CYMBALTA) 60 MG capsule Take by mouth at bedtime.     EYSUVIS 0.25 % SUSP Apply to eye in the morning, at noon, and at bedtime.     Magnesium Oxide 250 MG TABS Take 1 tablet by mouth every morning.     Multiple Vitamins-Minerals (CENTRUM SILVER 50+WOMEN) TABS Take 1 tablet by mouth daily.     Omega-3 1000 MG CAPS Take 1,000 mg by mouth daily.     Red Yeast Rice 600 MG TABS Take 1,200 mg by mouth daily.     temazepam (RESTORIL) 30 MG capsule Take by mouth at bedtime.     valACYclovir (VALTREX) 500 MG tablet Take 500 mg by mouth at bedtime.     lidocaine-prilocaine (EMLA) cream Apply to affected area once (Patient taking differently: as needed. Apply to affected area once) 30 g 3   Loperamide HCl (IMODIUM PO) Take 2 capsules by mouth as needed.     ondansetron (ZOFRAN) 8 MG tablet Take 1 tablet by mouth 2  times daily as needed. Start on the third day after cisplatin chemotherapy. 30 tablet 1   prochlorperazine (COMPAZINE) 10 MG tablet Take 1 tablet  by mouth every 6  hours as needed (Nausea or vomiting). (Patient taking differently: Take 10 mg by mouth.) 30 tablet 1    REVIEW OF SYSTEMS:  A 10+ POINT REVIEW OF SYSTEMS WAS OBTAINED including neurology, dermatology, psychiatry, cardiac, respiratory, lymph,  extremities, GI, GU, musculoskeletal, constitutional, reproductive, HEENT.  She did have some mild postcoital bleeding prior to diagnosis.  She had some mild vaginal bleeding after her CKC.  She denies any pelvic pain abdominal bloating.   PHYSICAL EXAM:  General: Alert and oriented, in no acute distress HEENT: Head is normocephalic. Extraocular movements are intact.  Neck: Neck is supple, no palpable cervical or supraclavicular lymphadenopathy. Heart: Regular in rate and rhythm  with no murmurs, rubs, or gallops. Chest: Clear to auscultation bilaterally, with no rhonchi, wheezes, or rales. Abdomen: Soft, nontender, nondistended, with no rigidity or guarding. Extremities: No cyanosis or edema. Lymphatics: see Neck Exam Skin: No concerning lesions. Musculoskeletal: symmetric strength and muscle tone throughout. Neurologic: Cranial nerves II through XII are grossly intact. No obvious focalities. Speech is fluent. Coordination is intact. Psychiatric: Judgment and insight are intact. Affect is appropriate.  On pelvic examination the external genitalia were unremarkable. A speculum exam was performed. There are no mucosal lesions noted in the vaginal vault.  Surgical changes noted along the cervical region.  On bimanual and rectovaginal examination there were no pelvic masses appreciated.  Cervix is essentially normal in size with palpation.   ECOG = 1  0 - Asymptomatic (Fully active, able to carry on all predisease activities without restriction)  1 - Symptomatic but completely ambulatory (Restricted in physically strenuous activity but ambulatory and able to carry out work of a light or sedentary nature. For example, light housework, office work)  2 - Symptomatic, <50% in bed during the day (Ambulatory and capable of all self care but unable to carry out any work activities. Up and about more than 50% of waking hours)  3 - Symptomatic, >50% in bed, but not bedbound (Capable of only limited  self-care, confined to bed or chair 50% or more of waking hours)  4 - Bedbound (Completely disabled. Cannot carry on any self-care. Totally confined to bed or chair)  5 - Death   Eustace Pen MM, Creech RH, Tormey DC, et al. [phone]). "Toxicity and response criteria of the [facility]". [place] Oncol. 5 (6): 649-55  LABORATORY DATA:  Lab Results  Component Value Date   WBC 4.3 07/24/2021   HGB 10.7 (L) 07/24/2021   HCT 32.6 (L) 07/24/2021   MCV 88.1 07/24/2021   PLT 181 07/24/2021   NEUTROABS 1.6 (L) 07/24/2021   Lab Results  Component Value Date   NA 141 07/24/2021   K 3.4 (L) 07/24/2021   CL 107 07/24/2021   CO2 29 07/24/2021   GLUCOSE 129 (H) 07/24/2021   BUN 8 07/24/2021   CREATININE 0.84 07/24/2021   CALCIUM 9.4 07/24/2021      RADIOGRAPHY: No results found.    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  As above the MRI shows right parametrial involvement and therefore she would not be a candidate for definitive treatment with a radical hysterectomy.  She has recently completed her radiosensitizing chemotherapy and external beam radiation therapy.  She tolerated this treatment reasonably well with expected side effects.  PLAN: She will be taken to the operating room for exam under anesthesia followed by placement of tandem ring apparatus in preparation for high-dose-rate radiation therapy.  Patient will be treated with iridium 192.      ------------------------------------------------  Blair Promise, PhD, MD  This document serves as a record of services personally performed by Gery Pray, MD. It was created on his behalf by Roney Mans, a trained medical scribe. The creation of this record is based on the scribe's personal observations and the provider's statements to them. This document has been checked and approved by the attending provider.

## 2021-08-21 ENCOUNTER — Encounter: Payer: Self-pay | Admitting: Radiation Oncology

## 2021-08-21 ENCOUNTER — Ambulatory Visit
Admission: RE | Admit: 2021-08-21 | Discharge: 2021-08-21 | Disposition: A | Discharge status: Home / Self Care | Payer: Managed Care, Other (non HMO) | Source: Ambulatory Visit | Attending: Radiation Oncology | Admitting: Radiation Oncology

## 2021-08-21 ENCOUNTER — Ambulatory Visit
Admission: RE | Admit: 2021-08-21 | Discharge: 2021-08-21 | Disposition: A | Discharge status: Home / Self Care | Payer: Managed Care, Other (non HMO) | Source: Ambulatory Visit | Attending: Gynecologic Oncology | Admitting: Gynecologic Oncology

## 2021-08-21 VITALS — BP 149/80 | HR 62 | Temp 98.6°F | Resp 20 | Ht 65.0 in | Wt 144.2 lb

## 2021-08-21 DIAGNOSIS — C539 Malignant neoplasm of cervix uteri, unspecified: Secondary | ICD-10-CM | POA: Insufficient documentation

## 2021-08-21 DIAGNOSIS — Z7952 Long term (current) use of systemic steroids: Secondary | ICD-10-CM | POA: Insufficient documentation

## 2021-08-21 DIAGNOSIS — Z8049 Family history of malignant neoplasm of other genital organs: Secondary | ICD-10-CM | POA: Insufficient documentation

## 2021-08-21 DIAGNOSIS — Z923 Personal history of irradiation: Secondary | ICD-10-CM | POA: Insufficient documentation

## 2021-08-21 DIAGNOSIS — Z79899 Other long term (current) drug therapy: Secondary | ICD-10-CM | POA: Insufficient documentation

## 2021-08-21 LAB — URINALYSIS, COMPLETE (UACMP) WITH MICROSCOPIC
Bilirubin Urine: NEGATIVE
Glucose, UA: NEGATIVE mg/dL
Hgb urine dipstick: NEGATIVE
Ketones, ur: NEGATIVE mg/dL
Leukocytes,Ua: NEGATIVE
Nitrite: NEGATIVE
Protein, ur: NEGATIVE mg/dL
Specific Gravity, Urine: 1.014 (ref 1.005–1.030)
pH: 7 (ref 5.0–8.0)

## 2021-08-21 NOTE — Progress Notes (Addendum)
Robin Steele is here today for follow up post radiation to the pelvic.  They completed their radiation on: 07/13/21   Does the patient complain of any of the following:  Pain:No Abdominal bloating: Yes at times.  Diarrhea/Constipation: Continues to have some diarrhea, which is relieved with Imodium.  Nausea/Vomiting: No Vaginal Discharge: Yes, Patient reports having a yellow and green discharge that does not have an odor.Patient states she has started to have sex.  Blood in Urine or Stool: No Urinary Issues (dysuria/incomplete emptying/ incontinence/ increased frequency/urgency): Reports bladder leakage and pressure when urinating . Patient states she feels like she has a UTI.  Does patient report using vaginal dilator 2-3 times a week and/or sexually active 2-3 weeks: Patient given S+ and M vaginal dilators with instructions. Patient voiced understanding. Post radiation skin changes: No   Additional comments if applicable:  BP (!) 400/86 (BP Location: Left Arm, Patient Position: Sitting, Cuff Size: Normal)   Pulse 62   Temp 98.6 F (37 C)   Resp 20   Ht '5\' 5"'$  (1.651 m)   Wt 144 lb 3.2 oz (65.4 kg)   LMP 12/18/2018   SpO2 100%   BMI 24.00 kg/m

## 2021-08-23 ENCOUNTER — Other Ambulatory Visit: Payer: Self-pay | Admitting: Radiation Oncology

## 2021-08-23 ENCOUNTER — Telehealth: Payer: Self-pay | Admitting: Oncology

## 2021-08-23 LAB — URINE CULTURE: Culture: 40000 — AB

## 2021-08-23 MED ORDER — NITROFURANTOIN MONOHYD MACRO 100 MG PO CAPS: 100.0000 mg | ORAL_CAPSULE | Freq: Two times a day (BID) | ORAL | 0 refills | Status: DC

## 2021-08-23 NOTE — Telephone Encounter (Signed)
Left a message advised that her urine culture was positive for an infection and that Dr. Sondra Come has sent a prescription for Macrobid to her pharmacy.

## 2021-09-28 ENCOUNTER — Telehealth: Payer: Self-pay | Admitting: Oncology

## 2021-09-28 MED ORDER — NITROFURANTOIN MONOHYD MACRO 100 MG PO CAPS
100.0000 mg | ORAL_CAPSULE | Freq: Two times a day (BID) | ORAL | 0 refills | Status: DC
Start: 2021-09-28 — End: 2021-10-17

## 2021-09-28 NOTE — Telephone Encounter (Addendum)
Called Robin Steele regarding refill request for [facility] sent from [facility].  She said that she is having symptoms of a UTI again with pressure and pain.  She is wondering if she can have it refilled.  Reviewed with Dr. Sondra Come and Macrobid has been refilled.  Called Robin Steele back and advised her about the refill and to call us if her symptoms do not improve.  Also advised to try Azo over the counter to help with the pain.  She verbalized understanding and agreement.

## 2021-10-12 ENCOUNTER — Other Ambulatory Visit: Payer: Self-pay | Admitting: Hematology and Oncology

## 2021-10-12 DIAGNOSIS — C539 Malignant neoplasm of cervix uteri, unspecified: Secondary | ICD-10-CM

## 2021-10-13 ENCOUNTER — Encounter (HOSPITAL_COMMUNITY)
Admission: RE | Admit: 2021-10-13 | Discharge: 2021-10-13 | Disposition: A | Discharge status: Home / Self Care | Payer: Managed Care, Other (non HMO) | Source: Ambulatory Visit | Attending: Hematology and Oncology | Admitting: Hematology and Oncology

## 2021-10-13 ENCOUNTER — Inpatient Hospital Stay: Payer: Managed Care, Other (non HMO) | Attending: Hematology and Oncology

## 2021-10-13 DIAGNOSIS — C539 Malignant neoplasm of cervix uteri, unspecified: Secondary | ICD-10-CM | POA: Insufficient documentation

## 2021-10-13 DIAGNOSIS — D61818 Other pancytopenia: Secondary | ICD-10-CM | POA: Insufficient documentation

## 2021-10-13 LAB — CMP (CANCER CENTER ONLY)
ALT: 14 U/L (ref 0–44)
AST: 20 U/L (ref 15–41)
Albumin: 3.9 g/dL (ref 3.5–5.0)
Alkaline Phosphatase: 68 U/L (ref 38–126)
Anion gap: 6 (ref 5–15)
BUN: 13 mg/dL (ref 6–20)
CO2: 28 mmol/L (ref 22–32)
Calcium: 9 mg/dL (ref 8.9–10.3)
Chloride: 108 mmol/L (ref 98–111)
Creatinine: 0.9 mg/dL (ref 0.44–1.00)
GFR, Estimated: >60 mL/min (ref >60)
Glucose, Bld: 95 mg/dL (ref 70–99)
Potassium: 3.6 mmol/L (ref 3.5–5.1)
Sodium: 142 mmol/L (ref 135–145)
Total Bilirubin: 0.4 mg/dL (ref 0.3–1.2)
Total Protein: 6.4 g/dL — ABNORMAL LOW (ref 6.5–8.1)

## 2021-10-13 LAB — CBC WITH DIFFERENTIAL (CANCER CENTER ONLY)
Abs Immature Granulocytes: 0 10*3/uL (ref 0.00–0.07)
Basophils Absolute: 0 10*3/uL (ref 0.0–0.1)
Basophils Relative: 1 %
Eosinophils Absolute: 0.2 10*3/uL (ref 0.0–0.5)
Eosinophils Relative: 5 %
HCT: 34.6 % — ABNORMAL LOW (ref 36.0–46.0)
Hemoglobin: 11.5 g/dL — ABNORMAL LOW (ref 12.0–15.0)
Immature Granulocytes: 0 %
Lymphocytes Relative: 32 %
Lymphs Abs: 1.1 10*3/uL (ref 0.7–4.0)
MCH: 27.9 pg (ref 26.0–34.0)
MCHC: 33.2 g/dL (ref 30.0–36.0)
MCV: 84 fL (ref 80.0–100.0)
Monocytes Absolute: 0.3 10*3/uL (ref 0.1–1.0)
Monocytes Relative: 8 %
Neutro Abs: 1.9 10*3/uL (ref 1.7–7.7)
Neutrophils Relative %: 54 %
Platelet Count: 200 10*3/uL (ref 150–400)
RBC: 4.12 MIL/uL (ref 3.87–5.11)
RDW: 12.3 % (ref 11.5–15.5)
WBC Count: 3.4 10*3/uL — ABNORMAL LOW (ref 4.0–10.5)
nRBC: 0 % (ref 0.0–0.2)

## 2021-10-13 LAB — GLUCOSE, CAPILLARY: Glucose-Capillary: 96 mg/dL (ref 70–99)

## 2021-10-13 LAB — MAGNESIUM: Magnesium: 2 mg/dL (ref 1.7–2.4)

## 2021-10-13 MED ORDER — SODIUM CHLORIDE 0.9% FLUSH
10.0000 mL | Freq: Once | INTRAVENOUS | Status: AC
  Administered 2021-10-13: 10 mL

## 2021-10-13 MED ORDER — FLUDEOXYGLUCOSE F - 18 (FDG) INJECTION
7.2000 | Freq: Once | INTRAVENOUS | Status: AC
  Administered 2021-10-13: 7.17 via INTRAVENOUS

## 2021-10-17 ENCOUNTER — Inpatient Hospital Stay (HOSPITAL_BASED_OUTPATIENT_CLINIC_OR_DEPARTMENT_OTHER): Payer: Managed Care, Other (non HMO) | Admitting: Hematology and Oncology

## 2021-10-17 ENCOUNTER — Encounter: Payer: Self-pay | Admitting: Hematology and Oncology

## 2021-10-17 VITALS — BP 147/82 | HR 65 | Temp 97.7°F | Resp 18 | Ht 65.0 in | Wt 144.0 lb

## 2021-10-17 DIAGNOSIS — C539 Malignant neoplasm of cervix uteri, unspecified: Secondary | ICD-10-CM | POA: Diagnosis not present

## 2021-10-17 DIAGNOSIS — D61818 Other pancytopenia: Secondary | ICD-10-CM | POA: Diagnosis not present

## 2021-10-17 DIAGNOSIS — R197 Diarrhea, unspecified: Secondary | ICD-10-CM | POA: Diagnosis not present

## 2021-10-17 NOTE — Assessment & Plan Note (Signed)
She is recovering well from treatment PET/CT imaging is reviewed with the patient which show no evidence of active disease She has appointment scheduled to see GYN oncologist next month If her internal exam is completely normal, we will get her port removed

## 2021-10-17 NOTE — Assessment & Plan Note (Signed)
This is improved since discontinuation of treatment I recommend observe only It should resolve by the end of the year

## 2021-10-17 NOTE — Progress Notes (Signed)
[place] OFFICE PROGRESS NOTE  Patient Care Team: Rosine Door as PCP - General (Physician Assistant) Janina Mayo, MD as PCP - Cardiology (Cardiology) Pleas Koch Virgina Evener, MD as Physician Assistant (Family Medicine)  ASSESSMENT & PLAN:  Cervical adenocarcinoma [facility] She is recovering well from treatment PET/CT imaging is reviewed with the patient which show no evidence of active disease She has appointment scheduled to see GYN oncologist next month If her internal exam is completely normal, we will get her port removed  Acquired pancytopenia [facility]) This is improved since discontinuation of treatment I recommend observe only It should resolve by the end of the year  Diarrhea Her diarrhea has almost completely resolved Observe only  No orders of the defined types were placed in this encounter.   All questions were answered. The patient knows to call the clinic with any problems, questions or concerns. The total time spent in the appointment was 30 minutes encounter with patients including review of chart and various tests results, discussions about plan of care and coordination of care plan   Heath Lark, MD 10/17/2021 12:57 PM  INTERVAL HISTORY: Please see below for problem oriented charting. she returns for follow-up after completion of treatment and review of test results She is doing well Majority of side effects from treatment has almost completely resolved She had 1 loose bowel movement perhaps once a week She started to exercise Denies recent pelvic pain or abnormal vaginal bleeding or discharge  REVIEW OF SYSTEMS:   Constitutional: Denies fevers, chills or abnormal weight loss Eyes: Denies blurriness of vision Ears, nose, mouth, throat, and face: Denies mucositis or sore throat Respiratory: Denies cough, dyspnea or wheezes Cardiovascular: Denies palpitation, chest discomfort or lower extremity swelling Gastrointestinal:  Denies  nausea, heartburn or change in bowel habits Skin: Denies abnormal skin rashes Lymphatics: Denies new lymphadenopathy or easy bruising Neurological:Denies numbness, tingling or new weaknesses Behavioral/Psych: Mood is stable, no new changes  All other systems were reviewed with the patient and are negative.  I have reviewed the past medical history, past surgical history, social history and family history with the patient and they are unchanged from previous note.  ALLERGIES:  has No Known Allergies.  MEDICATIONS:  Current Outpatient Medications  Medication Sig Dispense Refill   Ascorbic Acid (VITAMIN C) 1000 MG tablet Take 1,000 mg by mouth daily.     cholecalciferol (VITAMIN D3) 25 MCG (1000 UNIT) tablet Take 2,000 Units by mouth daily.     Cyanocobalamin (B-12) 5000 MCG CAPS Take 5,000 mcg by mouth daily.     DULoxetine (CYMBALTA) 60 MG capsule Take by mouth at bedtime.     EYSUVIS 0.25 % SUSP Apply to eye in the morning, at noon, and at bedtime.     lidocaine-prilocaine (EMLA) cream Apply to affected area once (Patient taking differently: as needed. Apply to affected area once) 30 g 3   Loperamide HCl (IMODIUM PO) Take 2 capsules by mouth as needed.     Magnesium Oxide 250 MG TABS Take 1 tablet by mouth every morning.     Multiple Vitamins-Minerals (CENTRUM SILVER 50+WOMEN) TABS Take 1 tablet by mouth daily.     Omega-3 1000 MG CAPS Take 1,000 mg by mouth daily.     Red Yeast Rice 600 MG TABS Take 1,200 mg by mouth daily.     temazepam (RESTORIL) 30 MG capsule Take by mouth at bedtime. (Patient not taking: Reported on 08/21/2021)     valACYclovir (VALTREX) 500  MG tablet Take 500 mg by mouth at bedtime.     No current facility-administered medications for this visit.    SUMMARY OF ONCOLOGIC HISTORY: Oncology History  Cervical adenocarcinoma ([place])  09/14/2020 Initial Diagnosis   The patient has a history of prior abnormal paps, did not require biopsies are procedures, normalized. Has  previously been HPV+.  More recent history is as follows: Pap 08/2020: ASC-H, HR HPV positive Colposcopy 09/14/20: ECC negative, CIN3 on cervical biopsies   02/15/2021 Pathology Results   [phone]  Cone biopsy of cervix showed invasive adenocarcinoma, 2.1 cm in maximum dimension, 1.5 cm depth of invasion.  Endocervical and deep margins are involved.  High-grade squamous intraepithelial lesion with involvement of endocervical glands were present.  Endocervix curettage show detached fragment of adenocarcinoma. P16 is strongly positive.   03/09/2021 Initial Diagnosis   Cervical adenocarcinoma ([place])   03/24/2021 PET scan   1. Moderate activity in the cervix with maximum SUV of 5.6. Some of this could be postprocedural/inflammatory related to recent conization, but residual tumor is not excluded. No findings of distant metastatic spread or hypermetabolic adenopathy.   03/30/2021 Imaging   MRI pelvis Repeat imaging was performed with intersection again showing the tumor biased towards the anterior aspect of the cervix but tracking towards the RIGHT posterolateral cervix with stromal disruption at the level of the inferior posterolateral cervix on the RIGHT (image 19/40)   Contrasted imaging (image 20/44) this shows contrast enhancement which follows this pattern. Early extension into the RIGHT posterolateral parametrium at the lower margin of the cervix is demonstrated on both contrasted and precontrast T2 weighted imaging. The lesion involves RIGHT anterolateral 2/3 of the cervix with loss of endocervical distinction noted on image 19 of 40 across the entire interface between the mass and the cervix, cervical disruption of stroma noted RIGHT posterolateral aspect as described.   Ovaries are normal.   Final impression: Cervical neoplasm with extension into cervical stroma and disruption of cervical stroma most notably along the RIGHT posterolateral margin with early extension into the RIGHT  parametrium.     03/31/2021 Cancer Staging   Staging form: Cervix Uteri, AJCC Version 9 - Clinical stage from 03/31/2021: Stage IIB (cT2b, cN0, cM0) - Signed by Heath Lark, MD on 03/31/2021 Stage prefix: Initial diagnosis   04/12/2021 - 05/10/2021 Chemotherapy   Patient is on Treatment Plan : Cervical Cisplatin q7d     04/12/2021 Procedure   Placement of single lumen port a cath via right internal jugular vein. The catheter tip lies at the cavo-atrial junction. A power injectable port a cath was placed and is ready for immediate use.   05/25/2021 Genetic Testing   Negative hereditary cancer genetic testing: no pathogenic variants detected in [facility] CustomNext-Cancer +RNAinsight Panel.  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 Report date is May 25, 2021.   The CustomNext-Cancer+RNAinsight panel offered by [facility] includes sequencing and rearrangement analysis for the following 47 genes:  APC, ATM, AXIN2, BARD1, BMPR1A, BRCA1, BRCA2, BRIP1, CDH1, CDK4, CDKN2A, CHEK2, DICER1, EPCAM, GREM1, HOXB13, MEN1, MLH1, MSH2, MSH3, MSH6, MUTYH, NBN, NF1, NF2, NTHL1, PALB2, PMS2, POLD1, POLE, PTEN, RAD51C, RAD51D, RECQL, RET, SDHA, SDHAF2, SDHB, SDHC, SDHD, SMAD4, SMARCA4, STK11, TP53, TSC1, TSC2, and VHL.  RNA data is routinely analyzed for use in variant interpretation for all genes.   10/16/2021 PET scan   1. Decrease in the ill-defined mild hypermetabolic activity in the cervix. 2. No evidence of hypermetabolic metastatic disease in the neck,  chest, abdomen, or pelvis.     PHYSICAL EXAMINATION: ECOG PERFORMANCE STATUS: 1 - Symptomatic but completely ambulatory  Vitals:   10/17/21 1234  BP: (!) 147/82  Pulse: 65  Resp: 18  Temp: 97.7 F (36.5 C)  SpO2: 100%   Filed Weights   10/17/21 1234  Weight: 144 lb (65.3 kg)    GENERAL:alert, no distress and comfortable NEURO: alert & oriented x 3 with fluent speech, no focal motor/sensory deficits  LABORATORY  DATA:  I have reviewed the data as listed    Component Value Date/Time   NA 142 10/13/2021 0849   K 3.6 10/13/2021 0849   CL 108 10/13/2021 0849   CO2 28 10/13/2021 0849   GLUCOSE 95 10/13/2021 0849   BUN 13 10/13/2021 0849   CREATININE 0.90 10/13/2021 0849   CALCIUM 9.0 10/13/2021 0849   PROT 6.4 (L) 10/13/2021 0849   ALBUMIN 3.9 10/13/2021 0849   AST 20 10/13/2021 0849   ALT 14 10/13/2021 0849   ALKPHOS 68 10/13/2021 0849   BILITOT 0.4 10/13/2021 0849   GFRNONAA >60 10/13/2021 0849    No results found for: "SPEP", "UPEP"  Lab Results  Component Value Date   WBC 3.4 (L) 10/13/2021   NEUTROABS 1.9 10/13/2021   HGB 11.5 (L) 10/13/2021   HCT 34.6 (L) 10/13/2021   MCV 84.0 10/13/2021   PLT 200 10/13/2021      Chemistry      Component Value Date/Time   NA 142 10/13/2021 0849   K 3.6 10/13/2021 0849   CL 108 10/13/2021 0849   CO2 28 10/13/2021 0849   BUN 13 10/13/2021 0849   CREATININE 0.90 10/13/2021 0849      Component Value Date/Time   CALCIUM 9.0 10/13/2021 0849   ALKPHOS 68 10/13/2021 0849   AST 20 10/13/2021 0849   ALT 14 10/13/2021 0849   BILITOT 0.4 10/13/2021 0849       RADIOGRAPHIC STUDIES: I have reviewed multiple imaging studies with the patient and family I have personally reviewed the radiological images as listed and agreed with the findings in the report. NM PET Image Restage (PS) Skull Base to Thigh (F-18 FDG)  Result Date: 10/16/2021 CLINICAL DATA:  Subsequent treatment strategy for cervical cancer. EXAM: NUCLEAR MEDICINE PET SKULL BASE TO THIGH TECHNIQUE: 7.17 mCi F-18 FDG was injected intravenously. Full-ring PET imaging was performed from the skull base to thigh after the radiotracer. CT data was obtained and used for attenuation correction and anatomic localization. Fasting blood glucose: 96 mg/dl COMPARISON:  MRI pelvis March 27, 2021 and PET-CT March 23, 2021 FINDINGS: Mediastinal blood pool activity: SUV max 1.9 Liver activity: SUV max NA  NECK: No hypermetabolic cervical adenopathy. Incidental CT findings: None. CHEST: No hypermetabolic thoracic adenopathy. No hypermetabolic pulmonary nodules or masses. Incidental CT findings: Right chest wall Port-A-Cath with tip near the superior cavoatrial junction. No suspicious pulmonary nodules or masses. ABDOMEN/PELVIS: Decrease in the ill-defined mild hypermetabolic activity in the cervix now with a max SUV of 3.6 previously 5.6. No abnormal hypermetabolic activity within the liver, pancreas, adrenal glands, or spleen. No hypermetabolic lymph nodes in the abdomen or pelvis. Incidental CT findings: None. SKELETON: No focal hypermetabolic activity to suggest skeletal metastasis. Incidental CT findings: None. IMPRESSION: 1. Decrease in the ill-defined mild hypermetabolic activity in the cervix. 2. No evidence of hypermetabolic metastatic disease in the neck, chest, abdomen, or pelvis. Electronically Signed   By: Dahlia Bailiff M.D.   On: 10/16/2021 08:18

## 2021-10-17 NOTE — Assessment & Plan Note (Signed)
Her diarrhea has almost completely resolved Observe only

## 2021-11-16 ENCOUNTER — Ambulatory Visit (INDEPENDENT_AMBULATORY_CARE_PROVIDER_SITE_OTHER): Payer: Managed Care, Other (non HMO) | Admitting: Orthopaedic Surgery

## 2021-11-16 ENCOUNTER — Ambulatory Visit (INDEPENDENT_AMBULATORY_CARE_PROVIDER_SITE_OTHER): Payer: Managed Care, Other (non HMO)

## 2021-11-16 ENCOUNTER — Encounter: Payer: Self-pay | Admitting: Orthopaedic Surgery

## 2021-11-16 VITALS — Ht 65.0 in | Wt 144.0 lb

## 2021-11-16 DIAGNOSIS — M65311 Trigger thumb, right thumb: Secondary | ICD-10-CM

## 2021-11-16 DIAGNOSIS — M79644 Pain in right finger(s): Secondary | ICD-10-CM

## 2021-11-16 NOTE — Progress Notes (Signed)
Office Visit Note   Patient: Robin Steele           Date of Birth: [birth year]           MRN: [id number] Visit Date: 11/16/2021              Requested by: Rosalee Kaufman, PA-C [place],  [place] [id number] PCP: Rosalee Kaufman, PA-C   Assessment & Plan: Visit Diagnoses:  1. Pain of right thumb   2. Trigger thumb, right thumb     Plan: trigger thumb injected.  Dorsal splint applied since she is mowing her yard today with her 0 turn mower.  She will let us know if she has persistent triggering.  Pathophysiology discussed.  Follow-Up Instructions: No follow-ups on file.   Orders:  Orders Placed This Encounter  Procedures   Hand/UE Inj: R thumb A1   XR Finger Thumb Right   No orders of the defined types were placed in this encounter.     Procedures: Hand/UE Inj: R thumb A1 for trigger finger on 11/16/2021 4:30 PM Details: volar approach Medications: 1 mL lidocaine 1 %; 0.5 mL bupivacaine 0.25 %; 20 mg methylPREDNISolone acetate 40 MG/ML      Clinical Data: No additional findings.   Subjective: Chief Complaint  Patient presents with   Right Thumb - Pain    HPI the patient is a pressure washing around the house and then started noticing clicking pain in her thumb and sharp pain with DIP motion.  Painful and she has problems squeezing objects.  She is right-hand dominant.  She has a port on her chest where she had chemo treatment for cervical cancer.  She states is in remission.  Previous cervical fusion C4-5 by me November 2022 she states that it did great she is very happy with the surgery.  Review of Systems updated unchanged   Objective: Vital Signs: Ht '5\' 5"'$  (1.651 m)   Wt 144 lb (65.3 kg)   LMP 12/18/2018   BMI 23.96 kg/m   Physical Exam Constitutional:      Appearance: She is well-developed.  HENT:     Head: Normocephalic.     Right Ear: External ear normal.     Left Ear: External ear normal. There is no impacted cerumen.  Eyes:      Pupils: Pupils are equal, round, and reactive to light.  Neck:     Thyroid: No thyromegaly.     Trachea: No tracheal deviation.  Cardiovascular:     Rate and Rhythm: Normal rate.  Pulmonary:     Effort: Pulmonary effort is normal.  Abdominal:     Palpations: Abdomen is soft.  Musculoskeletal:     Cervical back: No rigidity.  Skin:    General: Skin is warm and dry.  Neurological:     Mental Status: She is alert and oriented to person, place, and time.  Psychiatric:        Behavior: Behavior normal.   Port right anterior chest wall.  Ortho Exam tenderness over the A1 pulley and pressure over the A1 pulley right thumb reproduces her pain some popping with IP flexion but no definite locking.  Specialty Comments:  No specialty comments available.  Imaging: Three-view x-rays right thumb obtained and reviewed.  Negative for acute injury mild first CMC narrowing.   Impression: Right thumb x-rays negative for acute injury.   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   S/P cervical spinal fusion 11/18/2020   Spinal stenosis of cervical region 11/10/2020   Past Medical History:  Diagnosis Date   Anxiety    Arthritis 2011   in neck and back   Cervical cancer ([facility])    Depression    Difficulty sleeping    Dry eyes    Family history of breast cancer 05/08/2021   Family history of colon cancer 05/08/2021   Family history of uterine cancer 05/08/2021   History of basal cell carcinoma (BCC) excision 2022   skin  cancer, 2 areas removed per pt   History of COVID-19    01-2020 and 01-2021 all symptoms resolved   History of radiation therapy    Cervix- 04/23/21-07/13/21- Dr. Gery Pray   Hyperlipidemia    Pre-diabetes 2022   borderline, hgba1c 5.7 in august 2022    Family History  Problem Relation Age of Onset   Cancer Mother        maligant tumor on arm; dx 70s   Endometrial cancer Mother        dx 37s   Breast cancer Maternal Aunt        dx unknown age   Colon cancer Maternal Grandmother        dx unknown age   Uterine cancer Half-Sister        paternal half sister; dx before age 84   Skin cancer Half-Sister        ? melanoma; d. 57   Cervical cancer Other        mother's maternal half sister; dx after 63   Colon cancer Other        MGM's mother; dx unknown age   Prostate cancer Neg Hx    Ovarian cancer Neg Hx     Past Surgical History:  Procedure Laterality Date   ANTERIOR CERVICAL DECOMP/DISCECTOMY FUSION N/A 11/18/2020   Procedure: CERVICAL FOUR -CERVICAL FIVE  ANTERIOR CERVICAL DISCECTOMY FUSION, ALLOGRAFT, PLATE;  Surgeon: Marybelle Killings, MD;  Location: [facility];  Service: Orthopedics;  Laterality: N/A;   BASAL CELL CARCINOMA EXCISION  2022   pt has had areas removed in the past as well   CERVICAL CONE BIOPSY  02/15/2021   HERNIA REPAIR     pt states she had an umbilical hernia repair about 25 years ago (around 1997), unsure if repaired with mesh   IR IMAGING GUIDED PORT INSERTION  04/11/2021   OPERATIVE ULTRASOUND N/A 06/01/2021   Procedure: OPERATIVE ULTRASOUND;  Surgeon: Gery Pray, MD;  Location: [facility];  Service: Urology;  Laterality: N/A;   OPERATIVE ULTRASOUND N/A 06/19/2021   Procedure: OPERATIVE ULTRASOUND;  Surgeon: Gery Pray, MD;  Location: [facility];  Service: Urology;  Laterality: N/A;   OPERATIVE ULTRASOUND N/A 06/29/2021   Procedure: OPERATIVE ULTRASOUND;  Surgeon: Gery Pray, MD;  Location: [facility];   Service: Urology;  Laterality: N/A;   OPERATIVE ULTRASOUND N/A 07/06/2021   Procedure: OPERATIVE ULTRASOUND;  Surgeon: Gery Pray, MD;  Location: [facility];  Service: Urology;  Laterality: N/A;   OPERATIVE  ULTRASOUND N/A 07/13/2021   Procedure: OPERATIVE ULTRASOUND;  Surgeon: Gery Pray, MD;  Location: [facility];  Service: Urology;  Laterality: N/A;   TANDEM RING INSERTION N/A 06/01/2021   Procedure: TANDEM RING INSERTION;  Surgeon: Gery Pray, MD;  Location: [facility];  Service: Urology;  Laterality: N/A;   TANDEM RING INSERTION N/A 06/19/2021   Procedure: TANDEM RING INSERTION;  Surgeon: Gery Pray, MD;  Location: [facility];  Service: Urology;  Laterality: N/A;   TANDEM RING INSERTION N/A 06/29/2021   Procedure: TANDEM RING INSERTION;  Surgeon: Gery Pray, MD;  Location: [facility];  Service: Urology;  Laterality: N/A;   TANDEM RING INSERTION N/A 07/06/2021   Procedure: TANDEM RING INSERTION;  Surgeon: Gery Pray, MD;  Location: [facility];  Service: Urology;  Laterality: N/A;   TANDEM RING INSERTION N/A 07/13/2021   Procedure: TANDEM RING INSERTION;  Surgeon: Gery Pray, MD;  Location: [facility];  Service: Urology;  Laterality: N/A;   Social History   Occupational History   Not on file  Tobacco Use   Smoking status: Never   Smokeless tobacco: Never  Vaping Use   Vaping Use: Never used  Substance and Sexual Activity   Alcohol use: Yes    Comment: very occasional, maybe once or twice a year (1 glass of wine)   Drug use: Never   Sexual activity: Yes

## 2021-11-20 ENCOUNTER — Encounter: Payer: Self-pay | Admitting: Gynecologic Oncology

## 2021-11-21 ENCOUNTER — Inpatient Hospital Stay: Payer: Managed Care, Other (non HMO) | Attending: Hematology and Oncology | Admitting: Gynecologic Oncology

## 2021-11-21 VITALS — BP 129/67 | HR 65 | Temp 98.6°F | Resp 16 | Ht 65.35 in | Wt 144.7 lb

## 2021-11-21 DIAGNOSIS — Z9221 Personal history of antineoplastic chemotherapy: Secondary | ICD-10-CM | POA: Diagnosis not present

## 2021-11-21 DIAGNOSIS — R32 Unspecified urinary incontinence: Secondary | ICD-10-CM | POA: Insufficient documentation

## 2021-11-21 DIAGNOSIS — N898 Other specified noninflammatory disorders of vagina: Secondary | ICD-10-CM | POA: Diagnosis not present

## 2021-11-21 DIAGNOSIS — Z923 Personal history of irradiation: Secondary | ICD-10-CM | POA: Insufficient documentation

## 2021-11-21 DIAGNOSIS — C539 Malignant neoplasm of cervix uteri, unspecified: Secondary | ICD-10-CM | POA: Diagnosis not present

## 2021-11-21 DIAGNOSIS — N941 Unspecified dyspareunia: Secondary | ICD-10-CM | POA: Diagnosis not present

## 2021-11-21 DIAGNOSIS — L598 Other specified disorders of the skin and subcutaneous tissue related to radiation: Secondary | ICD-10-CM

## 2021-11-21 MED ORDER — BUPIVACAINE HCL 0.25 % IJ SOLN
0.5000 mL | INTRAMUSCULAR | Status: AC | PRN
  Administered 2021-11-16: .5 mL

## 2021-11-21 MED ORDER — LIDOCAINE HCL 1 % IJ SOLN
1.0000 mL | INTRAMUSCULAR | Status: AC | PRN
  Administered 2021-11-16: 1 mL

## 2021-11-21 MED ORDER — METHYLPREDNISOLONE ACETATE 40 MG/ML IJ SUSP
20.0000 mg | INTRAMUSCULAR | Status: AC | PRN
  Administered 2021-11-16: 20 mg

## 2021-11-21 NOTE — Patient Instructions (Signed)
It was good to see you today.  I will call you with your biopsy results later this week.  My clinic will reach out to Dr. Clabe Seal clinic to help get you scheduled for a follow-up visit in February.  I will see you back in May.  We will continue to alternate visits every 3 months.  If you develop any new and concerning symptoms before then, such as vaginal bleeding, pelvic pain, change to bowel function, or unintentional weight loss, please call to see me sooner.

## 2021-11-21 NOTE — Progress Notes (Signed)
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 Has previously been HPV+.  More recent history is as follows: Pap 08/2020: ASC-H, HR HPV positive Colposcopy 09/14/20: ECC negative, CIN3 on cervical biopsies   02/15/2021 Pathology Results   [phone]  Cone biopsy of cervix showed invasive adenocarcinoma, 2.1 cm in maximum dimension, 1.5 cm depth of invasion.  Endocervical and deep margins are involved.  High-grade squamous intraepithelial lesion with involvement of endocervical glands were present.  Endocervix curettage show detached fragment of adenocarcinoma. P16 is strongly positive.   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 Moderate activity in the cervix with maximum SUV of 5.6. Some of this could be postprocedural/inflammatory related to recent conization, but residual tumor is not excluded. No findings of distant metastatic spread or hypermetabolic adenopathy.   03/30/2021 Imaging   MRI pelvis Repeat imaging was performed with intersection again showing the tumor biased towards the anterior aspect of the cervix but tracking towards the RIGHT posterolateral cervix with stromal disruption at the level of the inferior posterolateral cervix on the RIGHT (image 19/40)   Contrasted imaging (image 20/44) this shows contrast enhancement which follows this pattern. Early extension into the RIGHT posterolateral parametrium at the lower margin of the cervix is demonstrated on both contrasted and precontrast T2 weighted imaging. The lesion involves RIGHT anterolateral 2/3 of the cervix with loss of endocervical distinction noted on image 19 of 40 across the entire interface between the  mass and the cervix, cervical disruption of stroma noted RIGHT posterolateral aspect as described.   Ovaries are normal.   Final impression: Cervical neoplasm with extension into cervical stroma and disruption of cervical stroma most notably along the RIGHT posterolateral margin with early extension into the RIGHT parametrium.     03/31/2021 Cancer Staging   Staging form: Cervix Uteri, AJCC Version 9 - Clinical stage from 03/31/2021: Stage IIB (cT2b, cN0, cM0) - Signed by Robin Lark, MD on 03/31/2021 Stage prefix: Initial diagnosis   04/12/2021 - 05/10/2021 Chemotherapy   Patient is on Treatment Plan : Cervical Cisplatin q7d     04/12/2021 Procedure   Placement of single lumen port a cath via right internal jugular vein. The catheter tip lies at the cavo-atrial junction. A power injectable port a cath was placed and is ready for immediate use.   04/13/2021 - 07/13/2021 Radiation Therapy   04/13/2021 through 07/13/2021 (Pelvic IMRT: 04/13/21 through 05/30/21) (HDR: 06/01/21 through 07/13/21) Site Technique Total Dose (Gy) Dose per Fx (Gy) Completed Fx Beam Energies  Pelvis: Pelvis IMRT 45/45 1.8 25/25 6X  Pelvis: Pelvis_Bst 3D 9/9 1.8 5/5 15X  Cervix: Cervix_Bst_Fx1 HDR-brachy 5.5/5.5 5.5 1/1 Ir-192  Cervix: Cervix_Bst_Fx2 HDR-brachy 5.5/5.5 5.5 1/1 Ir-192  Cervix: Cervix_Bst_Fx3 HDR-brachy 5.5/5.5 5.5 1/1 Ir-192  Cervix: Cervix_Bst_Fx4 HDR-brachy 5.5/5.5 5.5 1/1 Ir-192  Cervix: Cervix_Bst_Fx5 HDR-brachy 5.5/5.5 5.5 1/1 Ir-192        05/25/2021 Genetic Testing   Negative hereditary cancer genetic testing: no pathogenic variants detected in [facility] CustomNext-Cancer +RNAinsight Panel.  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 Report date is May 25, 2021.   The CustomNext-Cancer+RNAinsight panel offered by [facility] includes sequencing and rearrangement analysis for the following 47 genes:  APC, ATM, AXIN2, BARD1,  BMPR1A, BRCA1, BRCA2, BRIP1, CDH1, CDK4, CDKN2A,  CHEK2, DICER1, EPCAM, GREM1, HOXB13, MEN1, MLH1, MSH2, MSH3, MSH6, MUTYH, NBN, NF1, NF2, NTHL1, PALB2, PMS2, POLD1, POLE, PTEN, RAD51C, RAD51D, RECQL, RET, SDHA, SDHAF2, SDHB, SDHC, SDHD, SMAD4, SMARCA4, STK11, TP53, TSC1, TSC2, and VHL.  RNA data is routinely analyzed for use in variant interpretation for all genes.   10/16/2021 PET scan   1. Decrease in the ill-defined mild hypermetabolic activity in the cervix. 2. No evidence of hypermetabolic metastatic disease in the neck, chest, abdomen, or pelvis.     Interval History: Patient reports overall doing well.  Had diarrhea related to radiation which has finally resolved.  She was using Imodium as needed.  She denies any abdominal or pelvic pain.  Her appetite has been stable.  She denies any urinary symptoms.  About a month ago, she began having what she describes as yellow/green discharge, that she thinks is vaginal although was not sure if this was urinary incontinence.  She states that this discharge does not smell like urine.  She is wearing panty liners and having to change them 4 times a day.  She notes that they are mostly full when she changes them.  She is using her vaginal dilator regularly and having intercourse about once a week.  Past Medical/Surgical History: Past Medical History:  Diagnosis Date   Anxiety    Arthritis 2011   in neck and back   Cervical cancer ([name])    Depression    Difficulty sleeping    Dry eyes    Family history of breast cancer 05/08/2021   Family history of colon cancer 05/08/2021   Family history of uterine cancer 05/08/2021   History of basal cell carcinoma (BCC) excision 2022   skin cancer, 2 areas removed per pt   History of COVID-19    01-2020 and 01-2021 all symptoms resolved   History of radiation therapy    Cervix- 04/23/21-07/13/21- Dr. Gery Steele   Hyperlipidemia    Pre-diabetes 2022   borderline, hgba1c 5.7 in august 2022    Past Surgical History:  Procedure Laterality Date    ANTERIOR CERVICAL DECOMP/DISCECTOMY FUSION N/A 11/18/2020   Procedure: CERVICAL FOUR -CERVICAL FIVE  ANTERIOR CERVICAL DISCECTOMY FUSION, ALLOGRAFT, PLATE;  Surgeon: Robin Killings, MD;  Location: [place];  Service: Orthopedics;  Laterality: N/A;   BASAL CELL CARCINOMA EXCISION  2022   pt has had areas removed in the past as well   CERVICAL CONE BIOPSY  02/15/2021   HERNIA REPAIR     pt states she had an umbilical hernia repair about 25 years ago (around 1997), unsure if repaired with mesh   IR IMAGING GUIDED PORT INSERTION  04/11/2021   OPERATIVE ULTRASOUND N/A 06/01/2021   Procedure: OPERATIVE ULTRASOUND;  Surgeon: Robin Pray, MD;  Location: [facility];  Service: Urology;  Laterality: N/A;   OPERATIVE ULTRASOUND N/A 06/19/2021   Procedure: OPERATIVE ULTRASOUND;  Surgeon: Robin Pray, MD;  Location: [facility];  Service: Urology;  Laterality: N/A;   OPERATIVE ULTRASOUND N/A 06/29/2021   Procedure: OPERATIVE ULTRASOUND;  Surgeon: Robin Pray, MD;  Location: [facility];  Service: Urology;  Laterality: N/A;   OPERATIVE ULTRASOUND N/A 07/06/2021   Procedure: OPERATIVE ULTRASOUND;  Surgeon: Robin Pray, MD;  Location: [facility];  Service: Urology;  Laterality: N/A;   OPERATIVE ULTRASOUND N/A 07/13/2021   Procedure: OPERATIVE ULTRASOUND;  Surgeon: Robin Pray, MD;  Location: [facility];  Service: Urology;  Laterality: N/A;   TANDEM RING INSERTION N/A 06/01/2021   Procedure: TANDEM RING INSERTION;  Surgeon: Robin Pray, MD;  Location: [facility];  Service: Urology;  Laterality: N/A;   TANDEM RING INSERTION N/A 06/19/2021   Procedure: TANDEM RING INSERTION;  Surgeon: Robin Pray, MD;  Location: [facility];  Service: Urology;  Laterality: N/A;   TANDEM RING INSERTION N/A 06/29/2021   Procedure: TANDEM RING INSERTION;  Surgeon: Robin Pray, MD;  Location: [facility];   Service: Urology;  Laterality: N/A;   TANDEM RING INSERTION N/A 07/06/2021   Procedure: TANDEM RING INSERTION;  Surgeon: Robin Pray, MD;  Location: [facility];  Service: Urology;  Laterality: N/A;   TANDEM RING INSERTION N/A 07/13/2021   Procedure: TANDEM RING INSERTION;  Surgeon: Robin Pray, MD;  Location: [facility];  Service: Urology;  Laterality: N/A;    Family History  Problem Relation Age of Onset   Cancer Mother        maligant tumor on arm; dx 13s   Endometrial cancer Mother        dx 63s   Breast cancer Maternal Aunt        dx unknown age   Colon cancer Maternal Grandmother        dx unknown age   Uterine cancer Half-Sister        paternal half sister; dx before age 44   Skin cancer Half-Sister        ? melanoma; d. 24   Cervical cancer Other        mother's maternal half sister; dx after 69   Colon cancer Other        MGM's mother; dx unknown age   Prostate cancer Neg Hx    Ovarian cancer Neg Hx     Social History   Socioeconomic History   Marital status: Single    Spouse name: Not on file   Number of children: Not on file   Years of education: Not on file   Highest education level: Not on file  Occupational History   Not on file  Tobacco Use   Smoking status: Never   Smokeless tobacco: Never  Vaping Use   Vaping Use: Never used  Substance and Sexual Activity   Alcohol use: Yes    Comment: very occasional, maybe once or twice a year (1 glass of wine)   Drug use: Never   Sexual activity: Yes  Other Topics Concern   Not on file  Social History Narrative   Not on file   Social Determinants of Health   Financial Resource Strain: Not on file  Food Insecurity: Not on file  Transportation Needs: Not on file  Physical Activity: Not on file  Stress: Not on file  Social Connections: Not on file    Current Medications:  Current Outpatient Medications:    Ascorbic Acid (VITAMIN C) 1000 MG tablet, Take 1,000 mg by  mouth daily., Disp: , Rfl:    cholecalciferol (VITAMIN D3) 25 MCG (1000 UNIT) tablet, Take 2,000 Units by mouth daily., Disp: , Rfl:    Cyanocobalamin (B-12) 5000 MCG CAPS, Take 5,000 mcg by mouth daily., Disp: , Rfl:    DULoxetine (CYMBALTA) 60 MG capsule, Take by mouth at bedtime., Disp: , Rfl:    EYSUVIS 0.25 % SUSP, Apply to eye in the morning, at noon, and at bedtime., Disp: , Rfl:    Magnesium Oxide 250 MG TABS, Take 1 tablet by  mouth every morning., Disp: , Rfl:    Multiple Vitamins-Minerals (CENTRUM SILVER 50+WOMEN) TABS, Take 1 tablet by mouth daily., Disp: , Rfl:    Omega-3 1000 MG CAPS, Take 1,000 mg by mouth daily., Disp: , Rfl:    Red Yeast Rice 600 MG TABS, Take 1,200 mg by mouth daily., Disp: , Rfl:    temazepam (RESTORIL) 30 MG capsule, Take by mouth at bedtime., Disp: , Rfl:    valACYclovir (VALTREX) 500 MG tablet, Take 500 mg by mouth at bedtime., Disp: , Rfl:    lidocaine-prilocaine (EMLA) cream, Apply to affected area once (Patient taking differently: as needed. Apply to affected area once), Disp: 30 g, Rfl: 3   Loperamide HCl (IMODIUM PO), Take 2 capsules by mouth as needed., Disp: , Rfl:   Review of Systems: + Hearing loss, ringing in ears, voice changes, vision problems, swelling of the legs, dyspareunia, incontinence, vaginal discharge. Denies appetite changes, fevers, chills, fatigue, unexplained weight changes. Denies neck lumps or masses, mouth sores. Denies cough or wheezing.  Denies shortness of breath. Denies chest pain or palpitations.  Denies abdominal distention, pain, blood in stools, constipation, diarrhea, nausea, vomiting, or early satiety. Denies dysuria, frequency, hematuria. Denies hot flashes, pelvic pain, vaginal bleeding.   Denies joint pain, back pain or muscle pain/cramps. Denies itching, rash, or wounds. Denies dizziness, headaches, numbness or seizures. Denies swollen lymph nodes or glands, denies easy bruising or bleeding. Denies anxiety,  depression, confusion, or decreased concentration.  Physical Exam: BP 129/67 (BP Location: Left Arm, Patient Position: Sitting)   Pulse 65   Temp 98.6 F (37 C) (Oral)   Resp 16   Ht 5' 5.35" (1.66 m)   Wt 144 lb 11.2 oz (65.6 kg)   LMP 12/18/2018   SpO2 100%   BMI 23.82 kg/m  General: Alert, oriented, no acute distress.  HEENT: Normocephalic, atraumatic. Sclera anicteric.  Chest: Clear to auscultation bilaterally. No wheezes, rhonchi, or rales. Cardiovascular: Regular rate and rhythm, no murmurs, rubs, or gallops.  Abdomen: Normoactive bowel sounds. Soft, nondistended, nontender to palpation. No masses or hepatosplenomegaly appreciated. No palpable fluid wave.  Extremities: Grossly normal range of motion. Warm, well perfused. No edema bilaterally.  Skin: No rashes or lesions.  Lymphatics: No cervical, supraclavicular, or inguinal adenopathy.  GU: Normal appearing external genitalia without erythema, excoriation, or lesions.  Speculum exam reveals some thicker yellow discharge.  After this was removed using Q-tips, the cervix itself is flush with the vaginal apex and has some necrosis centrally.  Some moderate atrophy and radiation changes also noted at the vaginal apex.  No discrete lesions or masses.  No bleeding.  Bimanual exam reveals cervix is flush with the vagina, no firmness or nodularity.  Rectovaginal exam confirms findings.  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  She was then placed in dorsolithotomy position and a speculum was placed in the vagina.  Once the cervix was well visualized it was cleansed with Betadine x3.   Tischler forceps were then used to take a biopsy from the central portion of the cervix where necrosis was noted.  This was placed in formalin.  Overall the patient tolerated the procedure well.  All instruments were removed from the vagina.  Laboratory &  Radiologic Studies: PET 10/13/21: 1. Decrease in the ill-defined mild hypermetabolic activity in the cervix. 2. No evidence of hypermetabolic metastatic disease in the neck, chest, abdomen, or pelvis.  Assessment & Plan: Robin Steele is a 56 y.o. woman with Stage IIB adenocarcinoma of the cervix who presents for surveillance after completing primary chemoradiation.  Patient is overall doing well and has recovered from treatment side effects.  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  PET scan last month showed excellent response to treatment with no obvious evidence of persistent disease.  On exam today, she has some central necrosis of the cervix noted.  Biopsy was performed to rule out active malignancy.  I will call her with these results.  If biopsy confirms radiation necrosis, we discussed that typically the symptoms should get better within several months.  She could use hydrogen peroxide douching, although this is unlikely to significantly decrease her discharge.  This can be helpful for foul-smelling discharge.  She and I will discuss further when I call her with biopsy results.  Per NCCN surveillance recommendations, we discussed plan for surveillance visits every 3 months.  My office will reach out to Dr Clabe Seal office to facilitate getting the patient scheduled for follow-up in February.  I will see her back in May.  She will need yearly cotesting, which will be due in July or August of next year.  We reviewed signs and symptoms that should prompt a phone call between scheduled visits.  24 minutes of total time was spent for this patient encounter, including preparation,  face-to-face counseling with the patient and coordination of care, and documentation of the encounter.  Jeral Pinch, MD  Division of Gynecologic Oncology  Department of Obstetrics and Gynecology  [facility]

## 2021-11-23 ENCOUNTER — Telehealth: Payer: Self-pay | Admitting: Gynecologic Oncology

## 2021-11-23 DIAGNOSIS — C539 Malignant neoplasm of cervix uteri, unspecified: Secondary | ICD-10-CM

## 2021-11-23 LAB — SURGICAL PATHOLOGY

## 2021-11-23 NOTE — Telephone Encounter (Signed)
Called patient - discussed biopsy which shows necrotic and inflammed tissue, c/w radiation necrosis. Plan to place order for port removal. Discussed some interventions for discharge (eg hydrogen peroxide douches). Will get repeat imaging when I see her in May.   Jeral Pinch MD Gynecologic Oncology

## 2021-12-01 ENCOUNTER — Telehealth: Payer: Self-pay | Admitting: *Deleted

## 2021-12-01 NOTE — Telephone Encounter (Signed)
Patient called and stated "I saw Dr Berline Lopes last week. I was having some cervical drainage. I had some bleeding with the drainage yesterday. It was pinkish in color. I had intercourse and then the bleeding started.I have had intercourse before with no bleeding.  It was yesterday and none today. But Dr Berline Lopes did say to let her know." Explained that Dr Berline Lopes is out of the office today but the message will be sent to her. The office may call her back later today or on Monday. Explained for the patient to call the office back if her bleeding increased to a period type bleeding.

## 2021-12-04 ENCOUNTER — Telehealth: Payer: Self-pay | Admitting: Surgery

## 2021-12-04 NOTE — Telephone Encounter (Signed)
Called patient to check status of bleeding per phone call 11/24. Patient states bleeding stopped but then came back after she had intercourse again and bleeding returned. Patient states bleeding is minimal, spotting only and went away the next morning. Advised patient to continue to monitor and if bleeding increases in amount to period type bleeding to call our office back. Patient verbalized understanding and had no further questions at this time.

## 2021-12-08 ENCOUNTER — Encounter: Payer: Self-pay | Admitting: Hematology and Oncology

## 2021-12-08 ENCOUNTER — Other Ambulatory Visit: Payer: Self-pay | Admitting: Radiology

## 2021-12-08 NOTE — H&P (Signed)
Referring Physician(s): Tucker,Katherine R  Supervising Physician: Ruthann Cancer  Patient Status:  [facility] OP  Chief Complaint:  "I'm getting my port out"  Subjective: Pt known to IR service from port a cath placement on 04/11/21. She has a hx of cervical cancer , initially diagnosed in Feb 2023 , s/p chemoradiation. She has completed treatment and is no longer using her port. She presents today for port a cath removal. She denies fever,HA,CP,dyspnea, cough, abd/back pain,N/V or bleeding. She is anxious.    Past Medical History:  Diagnosis Date   Anxiety    Arthritis 2011   in neck and back   Cervical cancer ([employer])    Depression    Difficulty sleeping    Dry eyes    Family history of breast cancer 05/08/2021   Family history of colon cancer 05/08/2021   Family history of uterine cancer 05/08/2021   History of basal cell carcinoma (BCC) excision 2022   skin cancer, 2 areas removed per pt   History of COVID-19    01-2020 and 01-2021 all symptoms resolved   History of radiation therapy    Cervix- 04/23/21-07/13/21- Dr. Gery Pray   Hyperlipidemia    Pre-diabetes 2022   borderline, hgba1c 5.7 in august 2022   Past Surgical History:  Procedure Laterality Date   ANTERIOR CERVICAL DECOMP/DISCECTOMY FUSION N/A 11/18/2020   Procedure: CERVICAL FOUR -CERVICAL FIVE  ANTERIOR CERVICAL DISCECTOMY FUSION, ALLOGRAFT, PLATE;  Surgeon: Marybelle Killings, MD;  Location: [place];  Service: Orthopedics;  Laterality: N/A;   BASAL CELL CARCINOMA EXCISION  2022   pt has had areas removed in the past as well   CERVICAL CONE BIOPSY  02/15/2021   HERNIA REPAIR     pt states she had an umbilical hernia repair about 25 years ago (around 1997), unsure if repaired with mesh   IR IMAGING GUIDED PORT INSERTION  04/11/2021   OPERATIVE ULTRASOUND N/A 06/01/2021   Procedure: OPERATIVE ULTRASOUND;  Surgeon: Gery Pray, MD;  Location: [facility];  Service: Urology;  Laterality: N/A;   OPERATIVE  ULTRASOUND N/A 06/19/2021   Procedure: OPERATIVE ULTRASOUND;  Surgeon: Gery Pray, MD;  Location: [facility];  Service: Urology;  Laterality: N/A;   OPERATIVE ULTRASOUND N/A 06/29/2021   Procedure: OPERATIVE ULTRASOUND;  Surgeon: Gery Pray, MD;  Location: [facility];  Service: Urology;  Laterality: N/A;   OPERATIVE ULTRASOUND N/A 07/06/2021   Procedure: OPERATIVE ULTRASOUND;  Surgeon: Gery Pray, MD;  Location: [facility];  Service: Urology;  Laterality: N/A;   OPERATIVE ULTRASOUND N/A 07/13/2021   Procedure: OPERATIVE ULTRASOUND;  Surgeon: Gery Pray, MD;  Location: [facility];  Service: Urology;  Laterality: N/A;   TANDEM RING INSERTION N/A 06/01/2021   Procedure: TANDEM RING INSERTION;  Surgeon: Gery Pray, MD;  Location: [facility];  Service: Urology;  Laterality: N/A;   TANDEM RING INSERTION N/A 06/19/2021   Procedure: TANDEM RING INSERTION;  Surgeon: Gery Pray, MD;  Location: [facility];  Service: Urology;  Laterality: N/A;   TANDEM RING INSERTION N/A 06/29/2021   Procedure: TANDEM RING INSERTION;  Surgeon: Gery Pray, MD;  Location: [facility];  Service: Urology;  Laterality: N/A;   TANDEM RING INSERTION N/A 07/06/2021   Procedure: TANDEM RING INSERTION;  Surgeon: Gery Pray, MD;  Location: [facility];  Service: Urology;  Laterality: N/A;   TANDEM RING INSERTION N/A 07/13/2021   Procedure: TANDEM RING INSERTION;  Surgeon: Gery Pray, MD;  Location: [facility];  Service: Urology;  Laterality: N/A;      Allergies: Patient has no known allergies.  Medications: Prior to Admission medications   Medication Sig Start Date End Date Taking? Authorizing Provider  Ascorbic Acid (VITAMIN C) 1000 MG tablet Take 1,000 mg by mouth daily.    [provider]  cholecalciferol (VITAMIN D3) 25 MCG (1000 UNIT) tablet Take 2,000 Units  by mouth daily.    [provider]  Cyanocobalamin (B-12) 5000 MCG CAPS Take 5,000 mcg by mouth daily.    [provider]  DULoxetine (CYMBALTA) 60 MG capsule Take by mouth at bedtime.    [provider]  EYSUVIS 0.25 % SUSP Apply to eye in the morning, at noon, and at bedtime. 02/25/21   [provider]  Magnesium Oxide 250 MG TABS Take 1 tablet by mouth every morning.    [provider]  Multiple Vitamins-Minerals (CENTRUM SILVER 50+WOMEN) TABS Take 1 tablet by mouth daily.    [provider]  Omega-3 1000 MG CAPS Take 1,000 mg by mouth daily.    [provider]  Red Yeast Rice 600 MG TABS Take 1,200 mg by mouth daily.    [provider]  temazepam (RESTORIL) 30 MG capsule Take by mouth at bedtime. 06/13/17   [provider]  valACYclovir (VALTREX) 500 MG tablet Take 500 mg by mouth at bedtime. 08/05/10   [provider]     Vital Signs:pending  LMP 12/18/2018   Physical Exam awake/alert; chest- CTA bilat; clean, intact rt chest wall port a cath; heart- RRR; abd- soft,+BS,NT; no LE edema  Imaging: No results found.  Labs:  CBC: Recent Labs    05/08/21 1522 05/15/21 1607 07/24/21 1201 10/13/21 0849  WBC 2.5* 3.1* 4.3 3.4*  HGB 11.2* 10.9* 10.7* 11.5*  HCT 34.4* 33.4* 32.6* 34.6*  PLT 104* 102* 181 200    COAGS: No results for input(s): "INR", "APTT" in the last 8760 hours.  BMP: Recent Labs    05/08/21 1522 05/15/21 1607 07/24/21 1201 10/13/21 0849  NA 136 135 141 142  K 3.8 4.1 3.4* 3.6  CL 102 103 107 108  CO2 '29 25 29 28  '$ GLUCOSE 100* 202* 129* 95  BUN '14 16 8 13  '$ CALCIUM 8.6* 8.8* 9.4 9.0  CREATININE 0.80 0.84 0.84 0.90  GFRNONAA >60 >60 >60 >60    LIVER FUNCTION TESTS: Recent Labs    10/13/21 0849  BILITOT 0.4  AST 20  ALT 14  ALKPHOS 68  PROT 6.4*  ALBUMIN 3.9    Assessment and Plan: Pt known to IR service from port a cath placement on 04/11/21. She has a  hx of cervical cancer , initially diagnosed in Feb 2023 , s/p chemoradiation. She has completed treatment and is no longer using her port. She presents today for port a cath removal. Details/risks of procedure, incl but not limited to, internal bleeding, infection, injury to adjacent structures d/w pt with her understanding and consent.    Electronically Signed: D. Rowe Robert, PA-C 12/08/2021, 3:36 PM   I spent a total of 20 minutes at the the patient's bedside AND on the patient's hospital floor or unit, greater than 50% of which was counseling/coordinating care for port a cath removal

## 2021-12-11 ENCOUNTER — Encounter (HOSPITAL_COMMUNITY): Payer: Self-pay

## 2021-12-11 ENCOUNTER — Ambulatory Visit (HOSPITAL_COMMUNITY)
Admission: RE | Admit: 2021-12-11 | Discharge: 2021-12-11 | Disposition: A | Discharge status: Home / Self Care | Payer: Managed Care, Other (non HMO) | Source: Ambulatory Visit

## 2021-12-11 ENCOUNTER — Ambulatory Visit (HOSPITAL_COMMUNITY)
Admission: RE | Admit: 2021-12-11 | Discharge: 2021-12-11 | Disposition: A | Discharge status: Home / Self Care | Payer: Managed Care, Other (non HMO) | Source: Ambulatory Visit | Attending: Gynecologic Oncology | Admitting: Gynecologic Oncology

## 2021-12-11 ENCOUNTER — Other Ambulatory Visit: Payer: Self-pay

## 2021-12-11 DIAGNOSIS — Z923 Personal history of irradiation: Secondary | ICD-10-CM | POA: Diagnosis not present

## 2021-12-11 DIAGNOSIS — Z9221 Personal history of antineoplastic chemotherapy: Secondary | ICD-10-CM | POA: Insufficient documentation

## 2021-12-11 DIAGNOSIS — C539 Malignant neoplasm of cervix uteri, unspecified: Secondary | ICD-10-CM

## 2021-12-11 DIAGNOSIS — Z452 Encounter for adjustment and management of vascular access device: Secondary | ICD-10-CM | POA: Insufficient documentation

## 2021-12-11 DIAGNOSIS — Z8541 Personal history of malignant neoplasm of cervix uteri: Secondary | ICD-10-CM | POA: Insufficient documentation

## 2021-12-11 HISTORY — PX: IR REMOVAL TUN ACCESS W/ PORT W/O FL MOD SED: IMG2290

## 2021-12-11 MED ORDER — SODIUM CHLORIDE 0.9 % IV SOLN: INTRAVENOUS | Status: DC

## 2021-12-11 MED ORDER — FENTANYL CITRATE PF 50 MCG/ML IJ SOSY
PREFILLED_SYRINGE | INTRAMUSCULAR | Status: AC
  Filled 2021-12-11: qty 1

## 2021-12-11 MED ORDER — MIDAZOLAM HCL 2 MG/2ML IJ SOLN
INTRAMUSCULAR | Status: AC | PRN
  Administered 2021-12-11 (×2): 1 mg via INTRAVENOUS

## 2021-12-11 MED ORDER — LIDOCAINE HCL (PF) 1 % IJ SOLN
INTRAMUSCULAR | Status: AC | PRN
  Administered 2021-12-11: 10 mL

## 2021-12-11 MED ORDER — FENTANYL CITRATE (PF) 100 MCG/2ML IJ SOLN
INTRAMUSCULAR | Status: AC | PRN
  Administered 2021-12-11: 50 ug via INTRAVENOUS

## 2021-12-11 MED ORDER — LIDOCAINE-EPINEPHRINE 1 %-1:100000 IJ SOLN
INTRAMUSCULAR | Status: AC
  Administered 2021-12-11: 10 mL via INTRADERMAL
  Filled 2021-12-11: qty 1

## 2021-12-11 MED ORDER — MIDAZOLAM HCL 2 MG/2ML IJ SOLN
INTRAMUSCULAR | Status: AC
  Filled 2021-12-11: qty 2

## 2021-12-11 NOTE — Procedures (Signed)
Interventional Radiology Procedure Note  Procedure: Port removal  Findings: Please refer to procedural dictation for full description.  Complications: None immediate  Estimated Blood Loss: < 5 mL  Recommendations: Routine wound care. Follow up with IR as needed.   Laynie Espy, MD   

## 2021-12-11 NOTE — Progress Notes (Signed)
Ice pack sent home to use for comfort to right chest.

## 2021-12-11 NOTE — Discharge Instructions (Addendum)
Please call Interventional Radiology clinic [phone] with any questions or concerns.  May remove dressing tomorrow, 12-12-21 and shower.     Implanted Port Removal, Care After The following information offers guidance on how to care for yourself after your procedure. Your health care provider may also give you more specific instructions. If you have problems or questions, contact your health care provider. What can I expect after the procedure? After the procedure, it is common to have: Soreness or pain near your incision. Some swelling or bruising near your incision. Follow these instructions at home: Medicines Take over-the-counter and prescription medicines only as told by your health care provider. If you were prescribed an antibiotic medicine, take it as told by your health care provider. Do not stop taking the antibiotic even if you start to feel better. Bathing Do not take baths, swim, or use a hot tub until your health care provider approves. Ask your health care provider if you can take showers. You may only be allowed to take sponge baths. Incision care Follow instructions from your health care provider about how to take care of your incision. Make sure you: Wash your hands with soap and water for at least 20 seconds before and after you change your bandage (dressing). If soap and water are not available, use hand sanitizer. Change your dressing as told by your health care provider. Keep your dressing dry. Leave stitches (sutures), skin glue, or adhesive strips in place. These skin closures may need to stay in place for 2 weeks or longer. If adhesive strip edges start to loosen and curl up, you may trim the loose edges. Do not remove adhesive strips completely unless your health care provider tells you to do that. Check your incision area every day for signs of infection. Check for: More redness, swelling, or pain. More fluid or blood. Warmth. Pus or a bad  smell. Activity Return to your normal activities as told by your health care provider. Ask your health care provider what activities are safe for you. You may have to avoid lifting. Ask your health care provider how much you can safely lift. Do not do activities that involve lifting your arms over your head. Driving If you were given a sedative during the procedure, it can affect you for several hours. Do not drive or operate machinery until your health care provider says that it is safe. If you did not receive a sedative, ask your health care provider when it is safe to drive. General instructions Do not use any products that contain nicotine or tobacco. These products include cigarettes, chewing tobacco, and vaping devices, such as e-cigarettes. These can delay healing after surgery. If you need help quitting, ask your health care provider. Keep all follow-up visits. This is important. Contact a health care provider if: You have a fever or chills. You have more redness, swelling, or pain around your incision. You have more fluid or blood coming from your incision. Your incision feels warm to the touch. You have pus or a bad smell coming from your incision. You have pain that is not relieved by your pain medicine. Get help right away if: You have chest pain. You have difficulty breathing. These symptoms may be an emergency. Get help right away. Call 911. Do not wait to see if the symptoms will go away. Do not drive yourself to the hospital. Summary After the procedure, it is common to have pain, soreness, swelling, or bruising near your incision. If you were prescribed  an antibiotic medicine, take it as told by your health care provider. Do not stop taking the antibiotic even if you start to feel better. If you were given a sedative during the procedure, it can affect you for several hours. Do not drive or operate machinery until your health care provider says that it is safe. Return to  your normal activities as told by your health care provider. Ask your health care provider what activities are safe for you. This information is not intended to replace advice given to you by your health care provider. Make sure you discuss any questions you have with your health care provider. Document Revised: 06/28/2020 Document Reviewed: 06/28/2020 Elsevier Patient Education  [place].   Moderate Conscious Sedation, Adult, Care After This sheet gives you information about how to care for yourself after your procedure. Your health care provider may also give you more specific instructions. If you have problems or questions, contact your health care provider. What can I expect after the procedure? After the procedure, it is common to have: Sleepiness for several hours. Impaired judgment for several hours. Difficulty with balance. Vomiting if you eat too soon. Follow these instructions at home: For the time period you were told by your health care provider:     Rest. Do not participate in activities where you could fall or become injured. Do not drive or use machinery. Do not drink alcohol. Do not take sleeping pills or medicines that cause drowsiness. Do not make important decisions or sign legal documents. Do not take care of children on your own. Eating and drinking  Follow the diet recommended by your health care provider. Drink enough fluid to keep your urine pale yellow. If you vomit: Drink water, juice, or soup when you can drink without vomiting. Make sure you have little or no nausea before eating solid foods. General instructions Take over-the-counter and prescription medicines only as told by your health care provider. Have a responsible adult stay with you for the time you are told. It is important to have someone help care for you until you are awake and alert. Do not smoke. Keep all follow-up visits as told by your health care provider. This is  important. Contact a health care provider if: You are still sleepy or having trouble with balance after 24 hours. You feel light-headed. You keep feeling nauseous or you keep vomiting. You develop a rash. You have a fever. You have redness or swelling around the IV site. Get help right away if: You have trouble breathing. You have new-onset confusion at home. Summary After the procedure, it is common to feel sleepy, have impaired judgment, or feel nauseous if you eat too soon. Rest after you get home. Know the things you should not do after the procedure. Follow the diet recommended by your health care provider and drink enough fluid to keep your urine pale yellow. Get help right away if you have trouble breathing or new-onset confusion at home. This information is not intended to replace advice given to you by your health care provider. Make sure you discuss any questions you have with your health care provider. Document Revised: 04/24/2019 Document Reviewed: 11/20/2018 Elsevier Patient Education  [place].

## 2021-12-14 ENCOUNTER — Telehealth: Payer: Self-pay

## 2021-12-14 NOTE — Telephone Encounter (Signed)
Would one of you please call this patient for update? Thank you

## 2021-12-14 NOTE — Telephone Encounter (Signed)
(  See message from Bonna Gains RN on 11/27) I called Robin Steele today to follow up on bleeding    Pt states bleeding is the same, minimal blood mixed in with discharge after intercourse. It is usually gone in the AM. It is not heavy and no pain associated with bleeding or during intercourse.  Dr. Berline Lopes notified

## 2022-02-21 NOTE — Progress Notes (Signed)
Radiation Oncology         [phone] ________________________________  Name: Robin Steele MRN: [id number]  Date: 02/22/2022  DOB: 1965-04-09  Follow-Up Visit Note  CC: Rosalee Kaufman, PA-C  Lafonda Mosses, MD  No diagnosis found.  Diagnosis: The encounter diagnosis was Cervical adenocarcinoma ([place]).   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 +)   Cancer Staging  Cervical adenocarcinoma ([facility]) Staging form: Cervix Uteri, AJCC Version 9 - Clinical stage from 03/31/2021: Stage IIB (cT2b, cN0, cM0) - Signed by Heath Lark, MD on 03/31/2021  Interval Since Last Radiation: 7 months and 9 days   Intent: Curative  Radiation Treatment Dates: 04/13/2021 through 07/13/2021 (Pelvic IMRT: 04/13/21 through 05/30/21) (HDR: 06/01/21 through 07/13/21) Site Technique Total Dose (Gy) Dose per Fx (Gy) Completed Fx Beam Energies  Pelvis: Pelvis IMRT 45/45 1.8 25/25 6X  Pelvis: Pelvis_Bst 3D 9/9 1.8 5/5 15X  Cervix: Cervix_Bst_Fx1 HDR-brachy 5.5/5.5 5.5 1/1 Ir-192  Cervix: Cervix_Bst_Fx2 HDR-brachy 5.5/5.5 5.5 1/1 Ir-192  Cervix: Cervix_Bst_Fx3 HDR-brachy 5.5/5.5 5.5 1/1 Ir-192  Cervix: Cervix_Bst_Fx4 HDR-brachy 5.5/5.5 5.5 1/1 Ir-192  Cervix: Cervix_Bst_Fx5 HDR-brachy 5.5/5.5 5.5 1/1 Ir-192   Narrative:  The patient returns today for routine follow-up. She was last seen here for follow-up on 08/21/21. Since her last visit, the patient had a restaging PET scan performed on 10/13/21 which showed a decrease in ill-defined mild hypermetabolic activity in the cervix, and no evidence of hypermetabolic metastatic disease in the neck, chest, abdomen, or pelvis.   On 10/17/21, the patient followed up with Dr. Alvy Bimler. Per encounter notes, the patient reported near complete resolution of treatment side effects, which consisted of mainly diarrhea. Dr. 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   During her most  recent follow-up visit with Dr. Berline Lopes on 11/21/21, the patient reported complete resolution of her diarrhea from systemic treatment. She however endorsed a 1 month history of yellow/green discharge. 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 Berline Lopes. The cervix was also noted to be flush with the vaginal apex and with some necrosis centrally. No other abnormalities were appreciated on GU examination.  A cervical biopsy was obtained in the setting of new vaginal discharge and central necrosis of the cervix. Pathology revealed findings consistent with necrotic tissue, with acute and chronic inflammation. (No viable cervical mucosa was identified in the submitted specimen).       The patient had her port removed on [id number] without complication.    Of note: The patient has a family history of colon cancer and underwent a routine colonoscopy on 11/24/21. Biopsy of a rectal polyp was obtained during the procedure revealed findings consistent with a hyperplastic polyp, without evidence of malignancy.   ***  Allergies:  has No Known Allergies.  Meds: Current Outpatient Medications  Medication Sig Dispense Refill   Ascorbic Acid (VITAMIN C) 1000 MG tablet Take 1,000 mg by mouth daily.     cholecalciferol (VITAMIN D3) 25 MCG (1000 UNIT) tablet Take 2,000 Units by mouth daily.     Cyanocobalamin (B-12) 5000 MCG CAPS Take 5,000 mcg by mouth daily.     DULoxetine (CYMBALTA) 60 MG capsule Take by mouth at bedtime.     EYSUVIS 0.25 % SUSP Apply to eye in the morning, at noon, and at bedtime.     Magnesium Oxide 250 MG TABS Take 1 tablet by mouth every morning.  Multiple Vitamins-Minerals (CENTRUM SILVER 50+WOMEN) TABS Take 1 tablet by mouth daily.     Omega-3 1000 MG CAPS Take 1,000 mg by mouth daily.     Red Yeast Rice 600 MG TABS Take 1,200 mg by mouth daily.     temazepam (RESTORIL) 30 MG capsule Take by mouth at  bedtime.     valACYclovir (VALTREX) 500 MG tablet Take 500 mg by mouth at bedtime.     No current facility-administered medications for this encounter.    Physical Findings: The patient is in no acute distress. Patient is alert and oriented.  vitals were not taken for this visit. .  No significant changes. Lungs are clear to auscultation bilaterally. Heart has regular rate and rhythm. No palpable cervical, supraclavicular, or axillary adenopathy. Abdomen soft, non-tender, normal bowel sounds.  On pelvic examination the external genitalia were unremarkable. A speculum exam was performed. There are no mucosal lesions noted in the vaginal vault. A Pap smear was obtained of the proximal vagina. On bimanual and rectovaginal examination there were no pelvic masses appreciated. ***   Lab Findings: Lab Results  Component Value Date   WBC 3.4 (L) 10/13/2021   HGB 11.5 (L) 10/13/2021   HCT 34.6 (L) 10/13/2021   MCV 84.0 10/13/2021   PLT 200 10/13/2021    Radiographic Findings: No results found.  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  ***  Plan:  ***   *** minutes of total time was spent for this patient encounter, including preparation, face-to-face counseling with the patient and coordination of care, physical exam, and documentation of the encounter. ____________________________________  Blair Promise, PhD, MD  This document serves as a record of services personally performed by Gery Pray, MD. It was created on his behalf by Roney Mans, a trained medical scribe. The creation of this record is based on the scribe's personal observations and the provider's statements to them. This document has been checked and approved by the attending provider.

## 2022-02-22 ENCOUNTER — Ambulatory Visit
Admission: RE | Admit: 2022-02-22 | Discharge: 2022-02-22 | Disposition: A | Discharge status: Home / Self Care | Payer: Managed Care, Other (non HMO) | Source: Ambulatory Visit | Attending: Radiation Oncology | Admitting: Radiation Oncology

## 2022-02-22 VITALS — BP 144/83 | HR 73 | Temp 97.6°F | Resp 18 | Ht 65.0 in | Wt 149.0 lb

## 2022-02-22 DIAGNOSIS — N898 Other specified noninflammatory disorders of vagina: Secondary | ICD-10-CM | POA: Insufficient documentation

## 2022-02-22 DIAGNOSIS — Z79899 Other long term (current) drug therapy: Secondary | ICD-10-CM | POA: Insufficient documentation

## 2022-02-22 DIAGNOSIS — Z9221 Personal history of antineoplastic chemotherapy: Secondary | ICD-10-CM | POA: Insufficient documentation

## 2022-02-22 DIAGNOSIS — Z800 Family history of malignant neoplasm of digestive organs: Secondary | ICD-10-CM | POA: Diagnosis not present

## 2022-02-22 DIAGNOSIS — Y781 Therapeutic (nonsurgical) and rehabilitative radiological devices associated with adverse incidents: Secondary | ICD-10-CM | POA: Insufficient documentation

## 2022-02-22 DIAGNOSIS — Y92238 Other place in hospital as the place of occurrence of the external cause: Secondary | ICD-10-CM | POA: Diagnosis not present

## 2022-02-22 DIAGNOSIS — Y842 Radiological procedure and radiotherapy as the cause of abnormal reaction of the patient, or of later complication, without mention of misadventure at the time of the procedure: Secondary | ICD-10-CM | POA: Insufficient documentation

## 2022-02-22 DIAGNOSIS — Z923 Personal history of irradiation: Secondary | ICD-10-CM | POA: Insufficient documentation

## 2022-02-22 DIAGNOSIS — C539 Malignant neoplasm of cervix uteri, unspecified: Secondary | ICD-10-CM | POA: Diagnosis not present

## 2022-02-22 NOTE — Progress Notes (Signed)
Robin Steele is here today for follow up post radiation to the pelvic.  They completed their radiation on: 07/13/21   Does the patient complain of any of the following:  Pain: Denies Abdominal bloating: Denies Diarrhea/Constipation: Denies--does alternate between several BMs a day (states they're formed and not loose/watery) or 3-4 days in-between BMs (states they're regular and not painful) Nausea/Vomiting: Denies Wt Readings from Last 3 Encounters:  02/22/22 149 lb (67.6 kg)  12/11/21 144 lb 11.2 oz (65.6 kg)  11/21/21 144 lb 11.2 oz (65.6 kg)   Vaginal Discharge: Reports a watery drainage since Oct/Nov. Has to wear pads/liners in her underwear. States it went away earlier this year, then returned but only after intercourse where it was also blood tinged. Has not had intercourse for a few days, but small amount of bloody discharge is still occurring. Reports a very mild odor Blood in Urine or Stool: Denies Urinary Issues (dysuria/incomplete emptying/ incontinence/ increased frequency/urgency): Denies Does patient report using vaginal dilator 2-3 times a week and/or sexually active 2-3 weeks: Having intercourse once a week Post radiation skin changes: Denies   Additional comments if applicable: Constant tinnitus  since late November/early December. Also feels like she has a constant lump in her throat, with occasional difficulty swallowing (Has not been referred to an ENT). Last saw Medical Oncologist (Dr. Alvy Bimler 10/2021)

## 2022-03-19 ENCOUNTER — Other Ambulatory Visit: Payer: Self-pay | Admitting: Nurse Practitioner

## 2022-03-19 DIAGNOSIS — Z1231 Encounter for screening mammogram for malignant neoplasm of breast: Secondary | ICD-10-CM

## 2022-05-01 ENCOUNTER — Ambulatory Visit: Payer: Managed Care, Other (non HMO)

## 2022-05-02 ENCOUNTER — Ambulatory Visit
Admission: RE | Admit: 2022-05-02 | Discharge: 2022-05-02 | Disposition: A | Discharge status: Home / Self Care | Payer: Managed Care, Other (non HMO) | Source: Ambulatory Visit | Attending: Nurse Practitioner | Admitting: Nurse Practitioner

## 2022-05-02 DIAGNOSIS — Z1231 Encounter for screening mammogram for malignant neoplasm of breast: Secondary | ICD-10-CM

## 2022-05-03 ENCOUNTER — Other Ambulatory Visit: Payer: Self-pay | Admitting: Hematology and Oncology

## 2022-05-03 ENCOUNTER — Telehealth: Payer: Self-pay

## 2022-05-03 ENCOUNTER — Encounter: Payer: Self-pay | Admitting: Hematology and Oncology

## 2022-05-03 DIAGNOSIS — H9313 Tinnitus, bilateral: Secondary | ICD-10-CM | POA: Insufficient documentation

## 2022-05-03 DIAGNOSIS — H9319 Tinnitus, unspecified ear: Secondary | ICD-10-CM | POA: Insufficient documentation

## 2022-05-03 NOTE — Telephone Encounter (Signed)
Faxed referral to Dr. Suszanne Conners at [phone], received fax confirmation.  Called and left a message for Wille Celeste that the referral was sent to Dr. Suszanne Conners. Ask her to call the office for questions.

## 2022-05-03 NOTE — Telephone Encounter (Signed)
-----   Message from Artis Delay, MD sent at 05/03/2022 12:34 PM EDT ----- Pls send ENT referral to anyone

## 2022-05-25 ENCOUNTER — Ambulatory Visit: Payer: Managed Care, Other (non HMO) | Admitting: Gynecologic Oncology

## 2022-05-31 ENCOUNTER — Encounter: Payer: Self-pay | Admitting: Gynecologic Oncology

## 2022-05-31 ENCOUNTER — Other Ambulatory Visit (HOSPITAL_COMMUNITY)
Admission: RE | Admit: 2022-05-31 | Discharge: 2022-05-31 | Disposition: A | Discharge status: Home / Self Care | Payer: Managed Care, Other (non HMO) | Source: Ambulatory Visit

## 2022-05-31 ENCOUNTER — Inpatient Hospital Stay: Payer: Managed Care, Other (non HMO) | Attending: Gynecologic Oncology | Admitting: Gynecologic Oncology

## 2022-05-31 VITALS — BP 129/77 | HR 70 | Temp 98.2°F | Resp 18 | Ht 65.35 in | Wt 148.4 lb

## 2022-05-31 DIAGNOSIS — N952 Postmenopausal atrophic vaginitis: Secondary | ICD-10-CM | POA: Insufficient documentation

## 2022-05-31 DIAGNOSIS — Z800 Family history of malignant neoplasm of digestive organs: Secondary | ICD-10-CM

## 2022-05-31 DIAGNOSIS — Z9221 Personal history of antineoplastic chemotherapy: Secondary | ICD-10-CM | POA: Diagnosis not present

## 2022-05-31 DIAGNOSIS — Z803 Family history of malignant neoplasm of breast: Secondary | ICD-10-CM | POA: Insufficient documentation

## 2022-05-31 DIAGNOSIS — Z923 Personal history of irradiation: Secondary | ICD-10-CM | POA: Insufficient documentation

## 2022-05-31 DIAGNOSIS — Z808 Family history of malignant neoplasm of other organs or systems: Secondary | ICD-10-CM | POA: Diagnosis not present

## 2022-05-31 DIAGNOSIS — Z8541 Personal history of malignant neoplasm of cervix uteri: Secondary | ICD-10-CM | POA: Diagnosis present

## 2022-05-31 DIAGNOSIS — C539 Malignant neoplasm of cervix uteri, unspecified: Secondary | ICD-10-CM

## 2022-05-31 DIAGNOSIS — Z8049 Family history of malignant neoplasm of other genital organs: Secondary | ICD-10-CM | POA: Diagnosis not present

## 2022-05-31 MED ORDER — ESTROGENS CONJUGATED 0.625 MG/GM VA CREA
1.0000 | TOPICAL_CREAM | VAGINAL | 1 refills | Status: DC
Start: 2022-06-01 — End: 2023-06-07

## 2022-05-31 NOTE — Patient Instructions (Signed)
It was good to see you today.  I do not see or feel any evidence of cancer recurrence on your exam.  I will see you for follow-up in 6 months.  I am sending a prescription for vaginal estrogen to your pharmacy.  Please throw the applicator away and use a small amount of this 2-3 times a week at night in the vagina.  As always, if you develop any new and concerning symptoms before your next visit, please call to see me sooner.

## 2022-05-31 NOTE — Progress Notes (Signed)
Gynecologic Oncology Return Clinic Visit  05/31/22  Reason for Visit: surveillance  Treatment History: Oncology History  Cervical adenocarcinoma (HCC)  09/14/2020 Initial Diagnosis   The patient has a history of prior abnormal paps, did not require biopsies are procedures, normalized. Has previously been HPV+.  More recent history is as follows: Pap 08/2020: ASC-H, HR HPV positive Colposcopy 09/14/20: ECC negative, CIN3 on cervical biopsies   02/15/2021 Pathology Results   [phone]  Cone biopsy of cervix showed invasive adenocarcinoma, 2.1 cm in maximum dimension, 1.5 cm depth of invasion.  Endocervical and deep margins are involved.  High-grade squamous intraepithelial lesion with involvement of endocervical glands were present.  Endocervix curettage show detached fragment of adenocarcinoma. P16 is strongly positive.   03/09/2021 Initial Diagnosis   Cervical adenocarcinoma (HCC)   03/24/2021 PET scan   1. Moderate activity in the cervix with maximum SUV of 5.6. Some of this could be postprocedural/inflammatory related to recent conization, but residual tumor is not excluded. No findings of distant metastatic spread or hypermetabolic adenopathy.   03/30/2021 Imaging   MRI pelvis Repeat imaging was performed with intersection again showing the tumor biased towards the anterior aspect of the cervix but tracking towards the RIGHT posterolateral cervix with stromal disruption at the level of the inferior posterolateral cervix on the RIGHT (image 19/40)   Contrasted imaging (image 20/44) this shows contrast enhancement which follows this pattern. Early extension into the RIGHT posterolateral parametrium at the lower margin of the cervix is demonstrated on both contrasted and precontrast T2 weighted imaging. The lesion involves RIGHT anterolateral 2/3 of the cervix with loss of endocervical distinction noted on image 19 of 40 across the entire interface between the mass and the cervix,  cervical disruption of stroma noted RIGHT posterolateral aspect as described.   Ovaries are normal.   Final impression: Cervical neoplasm with extension into cervical stroma and disruption of cervical stroma most notably along the RIGHT posterolateral margin with early extension into the RIGHT parametrium.     03/31/2021 Cancer Staging   Staging form: Cervix Uteri, AJCC Version 9 - Clinical stage from 03/31/2021: Stage IIB (cT2b, cN0, cM0) - Signed by Artis Delay, MD on 03/31/2021 Stage prefix: Initial diagnosis   04/12/2021 - 05/10/2021 Chemotherapy   Patient is on Treatment Plan : Cervical Cisplatin q7d     04/12/2021 Procedure   Placement of single lumen port a cath via right internal jugular vein. The catheter tip lies at the cavo-atrial junction. A power injectable port a cath was placed and is ready for immediate use.   04/13/2021 - 07/13/2021 Radiation Therapy   04/13/2021 through 07/13/2021 (Pelvic IMRT: 04/13/21 through 05/30/21) (HDR: 06/01/21 through 07/13/21) Site Technique Total Dose (Gy) Dose per Fx (Gy) Completed Fx Beam Energies  Pelvis: Pelvis IMRT 45/45 1.8 25/25 6X  Pelvis: Pelvis_Bst 3D 9/9 1.8 5/5 15X  Cervix: Cervix_Bst_Fx1 HDR-brachy 5.5/5.5 5.5 1/1 Ir-192  Cervix: Cervix_Bst_Fx2 HDR-brachy 5.5/5.5 5.5 1/1 Ir-192  Cervix: Cervix_Bst_Fx3 HDR-brachy 5.5/5.5 5.5 1/1 Ir-192  Cervix: Cervix_Bst_Fx4 HDR-brachy 5.5/5.5 5.5 1/1 Ir-192  Cervix: Cervix_Bst_Fx5 HDR-brachy 5.5/5.5 5.5 1/1 Ir-192        05/25/2021 Genetic Testing   Negative hereditary cancer genetic testing: no pathogenic variants detected in [facility]Next-Cancer +RNAinsight Panel.  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 Report date is May 25, 2021.   The CustomNext-Cancer+RNAinsight panel offered by [employer] includes sequencing and rearrangement analysis for the following 47 genes:  APC, ATM, AXIN2, BARD1, BMPR1A, BRCA1, BRCA2, BRIP1,  CDH1, CDK4, CDKN2A, CHEK2, DICER1, EPCAM,  GREM1, HOXB13, MEN1, MLH1, MSH2, MSH3, MSH6, MUTYH, NBN, NF1, NF2, NTHL1, PALB2, PMS2, POLD1, POLE, PTEN, RAD51C, RAD51D, RECQL, RET, SDHA, SDHAF2, SDHB, SDHC, SDHD, SMAD4, SMARCA4, STK11, TP53, TSC1, TSC2, and VHL.  RNA data is routinely analyzed for use in variant interpretation for all genes.   10/16/2021 PET scan   1. Decrease in the ill-defined mild hypermetabolic activity in the cervix. 2. No evidence of hypermetabolic metastatic disease in the neck, chest, abdomen, or pelvis.   11/24/2021 Pathology Results   A. CERVICAL BIOPSY:   - Necrotic tissue with acute and chronic inflammation  -  No viable cervical mucosa identified      Interval History: Patient reports overall doing well.  Endorses some vaginal spotting/bleeding after intercourse, otherwise denies any vaginal bleeding.  Mild insertional dyspareunia.  Has not been using her dilator.  Discharge that she was having began decreasing in January, finally stopped.  She notes normal bowel function.  Has some stress urinary incontinence with sneezing and coughing, better than it had been.  Endorses a good appetite.  Past Medical/Surgical History: Past Medical History:  Diagnosis Date   Anxiety    Arthritis 2011   in neck and back   Cervical cancer (HCC)    Depression    Difficulty sleeping    Dry eyes    Family history of breast cancer 05/08/2021   Family history of colon cancer 05/08/2021   Family history of uterine cancer 05/08/2021   History of basal cell carcinoma (BCC) excision 2022   skin cancer, 2 areas removed per pt   History of COVID-19    01-2020 and 01-2021 all symptoms resolved   History of radiation therapy    Cervix- 04/23/21-07/13/21- Dr. Antony Blackbird   Hyperlipidemia    Pre-diabetes 2022   borderline, hgba1c 5.7 in august 2022    Past Surgical History:  Procedure Laterality Date   ANTERIOR CERVICAL DECOMP/DISCECTOMY FUSION N/A 11/18/2020   Procedure: CERVICAL FOUR -CERVICAL FIVE  ANTERIOR CERVICAL  DISCECTOMY FUSION, ALLOGRAFT, PLATE;  Surgeon: Eldred Manges, MD;  Location: [facility] OR;  Service: Orthopedics;  Laterality: N/A;   BASAL CELL CARCINOMA EXCISION  2022   pt has had areas removed in the past as well   CERVICAL CONE BIOPSY  02/15/2021   HERNIA REPAIR     pt states she had an umbilical hernia repair about 25 years ago (around 1997), unsure if repaired with mesh   IR IMAGING GUIDED PORT INSERTION  04/11/2021   IR REMOVAL TUN ACCESS W/ PORT W/O FL MOD SED  12/11/2021   OPERATIVE ULTRASOUND N/A 06/01/2021   Procedure: OPERATIVE ULTRASOUND;  Surgeon: Antony Blackbird, MD;  Location: [facility];  Service: Urology;  Laterality: N/A;   OPERATIVE ULTRASOUND N/A 06/19/2021   Procedure: OPERATIVE ULTRASOUND;  Surgeon: Antony Blackbird, MD;  Location: [facility];  Service: Urology;  Laterality: N/A;   OPERATIVE ULTRASOUND N/A 06/29/2021   Procedure: OPERATIVE ULTRASOUND;  Surgeon: Antony Blackbird, MD;  Location: [facility];  Service: Urology;  Laterality: N/A;   OPERATIVE ULTRASOUND N/A 07/06/2021   Procedure: OPERATIVE ULTRASOUND;  Surgeon: Antony Blackbird, MD;  Location: [facility];  Service: Urology;  Laterality: N/A;   OPERATIVE ULTRASOUND N/A 07/13/2021   Procedure: OPERATIVE ULTRASOUND;  Surgeon: Antony Blackbird, MD;  Location: [facility];  Service: Urology;  Laterality: N/A;   TANDEM RING INSERTION N/A 06/01/2021   Procedure: TANDEM RING INSERTION;  Surgeon:  Antony Blackbird, MD;  Location: [facility];  Service: Urology;  Laterality: N/A;   TANDEM RING INSERTION N/A 06/19/2021   Procedure: TANDEM RING INSERTION;  Surgeon: Antony Blackbird, MD;  Location: [facility];  Service: Urology;  Laterality: N/A;   TANDEM RING INSERTION N/A 06/29/2021   Procedure: TANDEM RING INSERTION;  Surgeon: Antony Blackbird, MD;  Location: [facility];  Service: Urology;  Laterality: N/A;   TANDEM RING INSERTION N/A  07/06/2021   Procedure: TANDEM RING INSERTION;  Surgeon: Antony Blackbird, MD;  Location: [facility];  Service: Urology;  Laterality: N/A;   TANDEM RING INSERTION N/A 07/13/2021   Procedure: TANDEM RING INSERTION;  Surgeon: Antony Blackbird, MD;  Location: [facility];  Service: Urology;  Laterality: N/A;    Family History  Problem Relation Age of Onset   Cancer Mother        maligant tumor on arm; dx 30s   Endometrial cancer Mother        dx 107s   Breast cancer Maternal Aunt        dx unknown age   Colon cancer Maternal Grandmother        dx unknown age   Uterine cancer Half-Sister        paternal half sister; dx before age 9   Skin cancer Half-Sister        ? melanoma; d. 43   Cervical cancer Other        mother's maternal half sister; dx after 10   Colon cancer Other        MGM's mother; dx unknown age   Prostate cancer Neg Hx    Ovarian cancer Neg Hx     Social History   Socioeconomic History   Marital status: Single    Spouse name: Not on file   Number of children: Not on file   Years of education: Not on file   Highest education level: Not on file  Occupational History   Not on file  Tobacco Use   Smoking status: Never   Smokeless tobacco: Never  Vaping Use   Vaping Use: Never used  Substance and Sexual Activity   Alcohol use: Yes    Comment: very occasional, maybe once or twice a year (1 glass of wine)   Drug use: Never   Sexual activity: Yes  Other Topics Concern   Not on file  Social History Narrative   Not on file   Social Determinants of Health   Financial Resource Strain: Not on file  Food Insecurity: Not on file  Transportation Needs: Not on file  Physical Activity: Not on file  Stress: Not on file  Social Connections: Not on file    Current Medications:  Current Outpatient Medications:    Ascorbic Acid (VITAMIN C) 1000 MG tablet, Take 1,000 mg by mouth daily., Disp: , Rfl:    cholecalciferol (VITAMIN D3) 25 MCG  (1000 UNIT) tablet, Take 2,000 Units by mouth daily., Disp: , Rfl:    [START ON 06/01/2022] conjugated estrogens (PREMARIN) vaginal cream, Place 1 Applicatorful vaginally 3 (three) times a week., Disp: 42.5 g, Rfl: 1   Cyanocobalamin (B-12) 5000 MCG CAPS, Take 5,000 mcg by mouth daily., Disp: , Rfl:    DULoxetine (CYMBALTA) 60 MG capsule, Take by mouth at bedtime., Disp: , Rfl:    EYSUVIS 0.25 % SUSP, Apply to eye in the morning, at noon, and at bedtime., Disp: , Rfl:    Magnesium Oxide  250 MG TABS, Take 1 tablet by mouth every morning., Disp: , Rfl:    Multiple Vitamins-Minerals (CENTRUM SILVER 50+WOMEN) TABS, Take 1 tablet by mouth daily., Disp: , Rfl:    Omega-3 1000 MG CAPS, Take 1,000 mg by mouth daily., Disp: , Rfl:    Red Yeast Rice 600 MG TABS, Take 1,200 mg by mouth daily., Disp: , Rfl:    temazepam (RESTORIL) 30 MG capsule, Take by mouth at bedtime., Disp: , Rfl:    Turmeric Curcumin 500 MG CAPS, Take 1 capsule by mouth daily., Disp: , Rfl:    valACYclovir (VALTREX) 500 MG tablet, Take 500 mg by mouth at bedtime., Disp: , Rfl:   Review of Systems: + Ringing in the ears, dyspareunia, incontinence, vaginal bleeding during/after intercourse, anxiety, decreased concentration. Denies appetite changes, fevers, chills, fatigue, unexplained weight changes. Denies hearing loss, neck lumps or masses, mouth sores or voice changes. Denies cough or wheezing.  Denies shortness of breath. Denies chest pain or palpitations. Denies leg swelling. Denies abdominal distention, pain, blood in stools, constipation, diarrhea, nausea, vomiting, or early satiety. Denies dysuria, frequency, hematuria. Denies hot flashes, pelvic pain or vaginal discharge.   Denies joint pain, back pain or muscle pain/cramps. Denies itching, rash, or wounds. Denies dizziness, headaches, numbness or seizures. Denies swollen lymph nodes or glands, denies easy bruising or bleeding. Denies depression, confusion.  Physical  Exam: BP 129/77   Pulse 70   Temp 98.2 F (36.8 C) (Oral)   Resp 18   Ht 5' 5.35" (1.66 m)   Wt 148 lb 6.4 oz (67.3 kg)   LMP 12/18/2018   SpO2 100%   BMI 24.43 kg/m  General: Alert, oriented, no acute distress.  HEENT: Normocephalic, atraumatic. Sclera anicteric.  Chest: Clear to auscultation bilaterally. No wheezes, rhonchi, or rales. Cardiovascular: Regular rate and rhythm, no murmurs, rubs, or gallops.  Abdomen: Normoactive bowel sounds. Soft, nondistended, nontender to palpation. No masses or hepatosplenomegaly appreciated. No palpable fluid wave.  Extremities: Grossly normal range of motion. Warm, well perfused. No edema bilaterally.  Skin: No rashes or lesions.  Lymphatics: No cervical, supraclavicular, or inguinal adenopathy.  GU: Normal appearing external genitalia without erythema, excoriation, or lesions.  On speculum exam, no discharge noted.  With manipulation of the speculum, there is very minimal oozing from atrophic vaginal apex and cervix.  Significant radiation changes noted.  Cervix is now flush with the vaginal apex.  No lesions noted.  Pap and HPV collected.  On bimanual exam, cervix is flush with the vaginal apex, no firmness or nodularity. Rectovaginal exam confirms findings.  Laboratory & Radiologic Studies: None new  Assessment & Plan: Robin Steele is a 57 y.o. woman with Stage IIB adenocarcinoma of the cervix who presents for surveillance. Primary chemoRT completed 07/2021. 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  Doing well, NED on exam today.  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  Discussed vaginal atrophy and spotting related to intercourse.  Discussed trial of vaginal estrogen to see if this helps given significantly atrophic tissue.   Per NCCN surveillance recommendations, we discussed plan for surveillance visits every 3 months.  She will need yearly cotesting, which was  performed today.  We reviewed signs and symptoms that should prompt a phone call between scheduled visits.  22 minutes of total time was spent for this patient encounter, including preparation, face-to-face counseling with the patient and coordination of care, and documentation of the encounter.  Eugene Garnet,  MD  Division of Gynecologic Oncology  Department of Obstetrics and Gynecology  [facility]

## 2022-06-12 LAB — CYTOLOGY - PAP
Comment: NEGATIVE
Diagnosis: NEGATIVE
Diagnosis: REACTIVE
High risk HPV: NEGATIVE

## 2022-08-22 NOTE — Progress Notes (Signed)
Radiation Oncology         [phone] ________________________________  Name: Robin Steele MRN: [id number]  Date: 08/23/2022  DOB: September 19, 1965  Follow-Up Visit Note  CC: Royann Shivers, PA-C  Carver Fila, MD  No diagnosis found.  Diagnosis: The encounter diagnosis was Cervical adenocarcinoma (HCC).   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5/5.5 5.5 1/1 Ir-192  Cervix: Cervix_Bst_Fx2 HDR-brachy 5.5/5.5 5.5 1/1 Ir-192  Cervix: Cervix_Bst_Fx3 HDR-brachy 5.5/5.5 5.5 1/1 Ir-192  Cervix: Cervix_Bst_Fx4 HDR-brachy 5.5/5.5 5.5 1/1 Ir-192  Cervix: Cervix_Bst_Fx5 HDR-brachy 5.5/5.5 5.5 1/1 Ir-192   Narrative:  The patient returns today for routine follow-up. She was last seen here for follow-up on 02/22/22.  Since her last visit, the patient followed up with Dr. Pricilla Holm on 05/31/22. During which time, the patient endorsed some vaginal spotting/bleeding after intercourse, mild insertional dyspareunia, and some ongoing stress urinary incontinence with sneezing and coughing which has improved slightly. She otherwise denied any symptoms concerning for disease recurrence and was noted as NED on examination. She also reported resolution of her vaginal discharge which was related to radiation necrosis.   Pertinent imaging performed in the interval includes a bilateral screening mammogram on  05/02/22 which showed no evidence of malignancy in either breast.   ***                             Allergies:  has No Known Allergies.  Meds: Current Outpatient Medications  Medication Sig Dispense Refill   Ascorbic Acid (VITAMIN C) 1000 MG tablet Take 1,000 mg by mouth daily.     cholecalciferol (VITAMIN D3) 25 MCG (1000 UNIT) tablet Take 2,000 Units by mouth daily.     conjugated estrogens (PREMARIN) vaginal cream Place 1 Applicatorful vaginally 3 (three) times a week. 42.5 g 1   Cyanocobalamin (B-12) 5000 MCG CAPS Take 5,000 mcg by mouth daily.     DULoxetine (CYMBALTA) 60 MG capsule Take by mouth at bedtime.     EYSUVIS 0.25 % SUSP Apply to eye in the morning, at noon, and at bedtime.     Magnesium Oxide 250 MG TABS Take 1 tablet by mouth every morning.     Multiple Vitamins-Minerals (CENTRUM SILVER 50+WOMEN) TABS Take 1 tablet by mouth daily.     Omega-3 1000 MG CAPS Take 1,000 mg by mouth daily.     Red Yeast Rice 600 MG TABS Take 1,200 mg by mouth daily.     temazepam (RESTORIL) 30 MG capsule Take by mouth at bedtime.     Turmeric Curcumin 500 MG CAPS Take 1 capsule by mouth daily.     valACYclovir (VALTREX) 500 MG tablet Take 500 mg by mouth at bedtime.     No current facility-administered medications for this encounter.    Physical Findings: The patient is in no acute distress. Patient is alert and oriented.  vitals were  not taken for this visit. .  No significant changes. Lungs are clear to auscultation bilaterally. Heart has regular rate and rhythm. No palpable cervical, supraclavicular, or axillary adenopathy. Abdomen soft, non-tender, normal bowel sounds.  On pelvic examination the external genitalia were unremarkable. A speculum exam was performed. There are no mucosal lesions noted in the vaginal vault. A Pap smear was obtained of the proximal vagina. On bimanual and rectovaginal examination there were no pelvic masses appreciated. ***   Lab Findings: Lab Results   Component Value Date   WBC 3.4 (L) 10/13/2021   HGB 11.5 (L) 10/13/2021   HCT 34.6 (L) 10/13/2021   MCV 84.0 10/13/2021   PLT 200 10/13/2021    Radiographic Findings: No results found.  Impression: The encounter diagnosis was Cervical adenocarcinoma (HCC).   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  ***  Plan:  ***   *** minutes of total time was spent for this patient encounter, including preparation, face-to-face counseling with the patient and coordination of care, physical exam, and documentation of the encounter. ____________________________________  Billie Lade, PhD, MD  This document serves as a record of services personally performed by Antony Blackbird, MD. It was created on his behalf by Neena Rhymes, a trained medical scribe. The creation of this record is based on the scribe's personal observations and the provider's statements to them. This document has been checked and approved by the attending provider.

## 2022-08-23 ENCOUNTER — Ambulatory Visit
Admission: RE | Admit: 2022-08-23 | Discharge: 2022-08-23 | Disposition: A | Discharge status: Home / Self Care | Payer: Managed Care, Other (non HMO) | Source: Ambulatory Visit | Attending: Radiation Oncology | Admitting: Radiation Oncology

## 2022-08-23 ENCOUNTER — Other Ambulatory Visit: Payer: Self-pay

## 2022-08-23 VITALS — BP 113/75 | HR 67 | Temp 97.6°F | Resp 18 | Ht 65.0 in | Wt 149.4 lb

## 2022-08-23 DIAGNOSIS — Z923 Personal history of irradiation: Secondary | ICD-10-CM | POA: Insufficient documentation

## 2022-08-23 DIAGNOSIS — C539 Malignant neoplasm of cervix uteri, unspecified: Secondary | ICD-10-CM | POA: Insufficient documentation

## 2022-08-23 DIAGNOSIS — Z79899 Other long term (current) drug therapy: Secondary | ICD-10-CM | POA: Insufficient documentation

## 2022-08-23 NOTE — Progress Notes (Signed)
Robin Steele is here today for follow up post radiation to the pelvic.  They completed their radiation on: 07/13/2021  Does the patient complain of any of the following:  Pain: No Abdominal bloating: No Diarrhea/Constipation: No Nausea/Vomiting: No Vaginal Discharge: Mild Blood in Urine or Stool: No Urinary Issues (dysuria/incomplete emptying/ incontinence/ increased frequency/urgency): No Does patient report using vaginal dilator 2-3 times a week and/or sexually active 2-3 weeks: No Post radiation skin changes: No   BP 113/75 (BP Location: Left Arm, Patient Position: Sitting, Cuff Size: Normal)   Pulse 67   Temp 97.6 F (36.4 C)   Resp 18   Ht 5\' 5"  (1.651 m)   Wt 149 lb 6.4 oz (67.8 kg)   LMP 12/18/2018   SpO2 100%   BMI 24.86 kg/m

## 2022-11-28 ENCOUNTER — Encounter: Payer: Self-pay | Admitting: Gynecologic Oncology

## 2022-11-30 ENCOUNTER — Inpatient Hospital Stay: Payer: Managed Care, Other (non HMO) | Attending: Gynecologic Oncology | Admitting: Gynecologic Oncology

## 2022-11-30 ENCOUNTER — Encounter: Payer: Self-pay | Admitting: Gynecologic Oncology

## 2022-11-30 VITALS — BP 136/79 | HR 73 | Temp 98.3°F | Resp 16 | Ht 65.0 in | Wt 150.0 lb

## 2022-11-30 DIAGNOSIS — N952 Postmenopausal atrophic vaginitis: Secondary | ICD-10-CM

## 2022-11-30 DIAGNOSIS — Z9221 Personal history of antineoplastic chemotherapy: Secondary | ICD-10-CM | POA: Insufficient documentation

## 2022-11-30 DIAGNOSIS — Z923 Personal history of irradiation: Secondary | ICD-10-CM | POA: Insufficient documentation

## 2022-11-30 DIAGNOSIS — Z8541 Personal history of malignant neoplasm of cervix uteri: Secondary | ICD-10-CM | POA: Diagnosis not present

## 2022-11-30 DIAGNOSIS — C539 Malignant neoplasm of cervix uteri, unspecified: Secondary | ICD-10-CM

## 2022-11-30 NOTE — Patient Instructions (Signed)
It was good to see you today.  I do not see or feel any evidence of cancer recurrence on your exam.  I will see you for follow-up in 6 months.  As always, if you develop any new and concerning symptoms before your next visit, please call to see me sooner.   

## 2022-11-30 NOTE — Progress Notes (Signed)
Gynecologic Oncology Return Clinic Visit  11/30/22  Reason for Visit: surveillance   Treatment History: Oncology History  Cervical adenocarcinoma (HCC)  09/14/2020 Initial Diagnosis   The patient has a history of prior abnormal paps, did not require biopsies are procedures, normalized. Has previously been HPV+.  More recent history is as follows: Pap 08/2020: ASC-H, HR HPV positive Colposcopy 09/14/20: ECC negative, CIN3 on cervical biopsies   02/15/2021 Pathology Results   [phone]  Cone biopsy of cervix showed invasive adenocarcinoma, 2.1 cm in maximum dimension, 1.5 cm depth of invasion.  Endocervical and deep margins are involved.  High-grade squamous intraepithelial lesion with involvement of endocervical glands were present.  Endocervix curettage show detached fragment of adenocarcinoma. P16 is strongly positive.   03/09/2021 Initial Diagnosis   Cervical adenocarcinoma (HCC)   03/24/2021 PET scan   1. Moderate activity in the cervix with maximum SUV of 5.6. Some of this could be postprocedural/inflammatory related to recent conization, but residual tumor is not excluded. No findings of distant metastatic spread or hypermetabolic adenopathy.   03/30/2021 Imaging   MRI pelvis Repeat imaging was performed with intersection again showing the tumor biased towards the anterior aspect of the cervix but tracking towards the RIGHT posterolateral cervix with stromal disruption at the level of the inferior posterolateral cervix on the RIGHT (image 19/40)   Contrasted imaging (image 20/44) this shows contrast enhancement which follows this pattern. Early extension into the RIGHT posterolateral parametrium at the lower margin of the cervix is demonstrated on both contrasted and precontrast T2 weighted imaging. The lesion involves RIGHT anterolateral 2/3 of the cervix with loss of endocervical distinction noted on image 19 of 40 across the entire interface between the mass and the cervix,  cervical disruption of stroma noted RIGHT posterolateral aspect as described.   Ovaries are normal.   Final impression: Cervical neoplasm with extension into cervical stroma and disruption of cervical stroma most notably along the RIGHT posterolateral margin with early extension into the RIGHT parametrium.     03/31/2021 Cancer Staging   Staging form: Cervix Uteri, AJCC Version 9 - Clinical stage from 03/31/2021: Stage IIB (cT2b, cN0, cM0) - Signed by Artis Delay, MD on 03/31/2021 Stage prefix: Initial diagnosis   04/12/2021 - 05/10/2021 Chemotherapy   Patient is on Treatment Plan : Cervical Cisplatin q7d     04/12/2021 Procedure   Placement of single lumen port a cath via right internal jugular vein. The catheter tip lies at the cavo-atrial junction. A power injectable port a cath was placed and is ready for immediate use.   04/13/2021 - 07/13/2021 Radiation Therapy   04/13/2021 through 07/13/2021 (Pelvic IMRT: 04/13/21 through 05/30/21) (HDR: 06/01/21 through 07/13/21) Site Technique Total Dose (Gy) Dose per Fx (Gy) Completed Fx Beam Energies  Pelvis: Pelvis IMRT 45/45 1.8 25/25 6X  Pelvis: Pelvis_Bst 3D 9/9 1.8 5/5 15X  Cervix: Cervix_Bst_Fx1 HDR-brachy 5.5/5.5 5.5 1/1 Ir-192  Cervix: Cervix_Bst_Fx2 HDR-brachy 5.5/5.5 5.5 1/1 Ir-192  Cervix: Cervix_Bst_Fx3 HDR-brachy 5.5/5.5 5.5 1/1 Ir-192  Cervix: Cervix_Bst_Fx4 HDR-brachy 5.5/5.5 5.5 1/1 Ir-192  Cervix: Cervix_Bst_Fx5 HDR-brachy 5.5/5.5 5.5 1/1 Ir-192        05/25/2021 Genetic Testing   Negative hereditary cancer genetic testing: no pathogenic variants detected in [facility] CustomNext-Cancer +RNAinsight Panel.  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 Report date is May 25, 2021.   The CustomNext-Cancer+RNAinsight panel offered by [employer] includes sequencing and rearrangement analysis for the following 47 genes:  APC, ATM, AXIN2, BARD1, BMPR1A, BRCA1, BRCA2,  BRIP1, CDH1, CDK4, CDKN2A, CHEK2, DICER1, EPCAM,  GREM1, HOXB13, MEN1, MLH1, MSH2, MSH3, MSH6, MUTYH, NBN, NF1, NF2, NTHL1, PALB2, PMS2, POLD1, POLE, PTEN, RAD51C, RAD51D, RECQL, RET, SDHA, SDHAF2, SDHB, SDHC, SDHD, SMAD4, SMARCA4, STK11, TP53, TSC1, TSC2, and VHL.  RNA data is routinely analyzed for use in variant interpretation for all genes.   10/16/2021 PET scan   1. Decrease in the ill-defined mild hypermetabolic activity in the cervix. 2. No evidence of hypermetabolic metastatic disease in the neck, chest, abdomen, or pelvis.   11/24/2021 Pathology Results   A. CERVICAL BIOPSY:   - Necrotic tissue with acute and chronic inflammation  -  No viable cervical mucosa identified      Interval History: Doing well.  Endorses very scant spotting or discharge after intercourse, otherwise denies any vaginal bleeding or discharge.  Is not using her vaginal dilator but has intercourse once a week.  Reports baseline bowel and bladder function.  Denies any abdominal or pelvic pain.  Continues to have some symptoms of feeling a lump in her throat or like she chokes more easily on liquids.  The symptoms are stable.  She has had dry mouth and coughing, uses a humidifier.  Symptoms have improved recently.  Past Medical/Surgical History: Past Medical History:  Diagnosis Date   Anxiety    Arthritis 2011   in neck and back   Cervical cancer (HCC)    Depression    Difficulty sleeping    Dry eyes    Family history of breast cancer 05/08/2021   Family history of colon cancer 05/08/2021   Family history of uterine cancer 05/08/2021   History of basal cell carcinoma (BCC) excision 2022   skin cancer, 2 areas removed per pt   History of COVID-19    01-2020 and 01-2021 all symptoms resolved   History of radiation therapy    Cervix- 04/23/21-07/13/21- Dr. Antony Blackbird   Hyperlipidemia    Pre-diabetes 2022   borderline, hgba1c 5.7 in august 2022    Past Surgical History:  Procedure Laterality Date   ANTERIOR CERVICAL DECOMP/DISCECTOMY FUSION N/A  11/18/2020   Procedure: CERVICAL FOUR -CERVICAL FIVE  ANTERIOR CERVICAL DISCECTOMY FUSION, ALLOGRAFT, PLATE;  Surgeon: Eldred Manges, MD;  Location: [facility];  Service: Orthopedics;  Laterality: N/A;   BASAL CELL CARCINOMA EXCISION  2022   pt has had areas removed in the past as well   CERVICAL CONE BIOPSY  02/15/2021   HERNIA REPAIR     pt states she had an umbilical hernia repair about 25 years ago (around 1997), unsure if repaired with mesh   IR IMAGING GUIDED PORT INSERTION  04/11/2021   IR REMOVAL TUN ACCESS W/ PORT W/O FL MOD SED  12/11/2021   OPERATIVE ULTRASOUND N/A 06/01/2021   Procedure: OPERATIVE ULTRASOUND;  Surgeon: Antony Blackbird, MD;  Location: [facility];  Service: Urology;  Laterality: N/A;   OPERATIVE ULTRASOUND N/A 06/19/2021   Procedure: OPERATIVE ULTRASOUND;  Surgeon: Antony Blackbird, MD;  Location: [facility];  Service: Urology;  Laterality: N/A;   OPERATIVE ULTRASOUND N/A 06/29/2021   Procedure: OPERATIVE ULTRASOUND;  Surgeon: Antony Blackbird, MD;  Location: [facility];  Service: Urology;  Laterality: N/A;   OPERATIVE ULTRASOUND N/A 07/06/2021   Procedure: OPERATIVE ULTRASOUND;  Surgeon: Antony Blackbird, MD;  Location: [facility];  Service: Urology;  Laterality: N/A;   OPERATIVE ULTRASOUND N/A 07/13/2021   Procedure: OPERATIVE ULTRASOUND;  Surgeon: Antony Blackbird, MD;  Location: [facility];  Service: Urology;  Laterality: N/A;   TANDEM RING INSERTION N/A 06/01/2021   Procedure: TANDEM RING INSERTION;  Surgeon: Antony Blackbird, MD;  Location: [facility];  Service: Urology;  Laterality: N/A;   TANDEM RING INSERTION N/A 06/19/2021   Procedure: TANDEM RING INSERTION;  Surgeon: Antony Blackbird, MD;  Location: [facility];  Service: Urology;  Laterality: N/A;   TANDEM RING INSERTION N/A 06/29/2021   Procedure: TANDEM RING INSERTION;  Surgeon: Antony Blackbird, MD;  Location: [facility];  Service: Urology;  Laterality: N/A;   TANDEM RING INSERTION N/A 07/06/2021   Procedure: TANDEM RING INSERTION;  Surgeon: Antony Blackbird, MD;  Location: [facility];  Service: Urology;  Laterality: N/A;   TANDEM RING INSERTION N/A 07/13/2021   Procedure: TANDEM RING INSERTION;  Surgeon: Antony Blackbird, MD;  Location: [facility];  Service: Urology;  Laterality: N/A;    Family History  Problem Relation Age of Onset   Cancer Mother        maligant tumor on arm; dx 30s   Endometrial cancer Mother        dx 40s   Breast cancer Maternal Aunt        dx unknown age   Colon cancer Maternal Grandmother        dx unknown age   Uterine cancer Half-Sister        paternal half sister; dx before age 61   Skin cancer Half-Sister        ? melanoma; d. 29   Cervical cancer Other        mother's maternal half sister; dx after 80   Colon cancer Other        MGM's mother; dx unknown age   Prostate cancer Neg Hx    Ovarian cancer Neg Hx     Social History   Socioeconomic History   Marital status: Single    Spouse name: Not on file   Number of children: Not on file   Years of education: Not on file   Highest education level: Not on file  Occupational History   Not on file  Tobacco Use   Smoking status: Never   Smokeless tobacco: Never  Vaping Use   Vaping status: Never Used  Substance and Sexual Activity   Alcohol use: Yes    Comment: very occasional, maybe once or twice a year (1 glass of wine)   Drug use: Never   Sexual activity: Yes  Other Topics Concern   Not on file  Social History Narrative   Not on file   Social Determinants of Health   Financial Resource Strain: Not on file  Food Insecurity: Not on file  Transportation Needs: Not on file  Physical Activity: Not on file  Stress: Not on file  Social Connections: Not on file    Current Medications:  Current Outpatient Medications:    Ascorbic Acid (VITAMIN C) 1000 MG tablet, Take  1,000 mg by mouth daily., Disp: , Rfl:    cholecalciferol (VITAMIN D3) 25 MCG (1000 UNIT) tablet, Take 2,000 Units by mouth daily., Disp: , Rfl:    conjugated estrogens (PREMARIN) vaginal cream, Place 1 Applicatorful vaginally 3 (three) times a week., Disp: 42.5 g, Rfl: 1   Cyanocobalamin (B-12) 5000 MCG CAPS, Take 5,000 mcg by mouth daily., Disp: , Rfl:    DULoxetine (CYMBALTA) 60 MG capsule, Take by mouth at bedtime., Disp: , Rfl:    EYSUVIS 0.25 % SUSP,  Apply to eye in the morning, at noon, and at bedtime., Disp: , Rfl:    Magnesium Oxide 250 MG TABS, Take 1 tablet by mouth every morning., Disp: , Rfl:    Multiple Vitamins-Minerals (CENTRUM SILVER 50+WOMEN) TABS, Take 1 tablet by mouth daily., Disp: , Rfl:    Omega-3 1000 MG CAPS, Take 1,000 mg by mouth daily., Disp: , Rfl:    Red Yeast Rice 600 MG TABS, Take 1,200 mg by mouth daily., Disp: , Rfl:    temazepam (RESTORIL) 30 MG capsule, Take by mouth at bedtime., Disp: , Rfl:    Turmeric Curcumin 500 MG CAPS, Take 1 capsule by mouth daily., Disp: , Rfl:    valACYclovir (VALTREX) 500 MG tablet, Take 500 mg by mouth at bedtime., Disp: , Rfl:   Review of Systems: + Ringing in ears, hot flashes, dizziness Denies appetite changes, fevers, chills, fatigue, unexplained weight changes. Denies hearing loss, neck lumps or masses, mouth sores or voice changes. Denies cough or wheezing.  Denies shortness of breath. Denies chest pain or palpitations. Denies leg swelling. Denies abdominal distention, pain, blood in stools, constipation, diarrhea, nausea, vomiting, or early satiety. Denies pain with intercourse, dysuria, frequency, hematuria or incontinence. Denies pelvic pain, vaginal bleeding or vaginal discharge.   Denies joint pain, back pain or muscle pain/cramps. Denies itching, rash, or wounds. Denies headaches, numbness or seizures. Denies swollen lymph nodes or glands, denies easy bruising or bleeding. Denies anxiety, depression, confusion, or  decreased concentration.  Physical Exam: BP 136/79 (BP Location: Left Arm, Patient Position: Sitting)   Pulse 73   Temp 98.3 F (36.8 C) (Oral)   Resp 16   Ht 5\' 5"  (1.651 m)   Wt 150 lb (68 kg)   LMP 12/18/2018   BMI 24.96 kg/m  General: Alert, oriented, no acute distress.  HEENT: Normocephalic, atraumatic. Sclera anicteric.  Chest: Clear to auscultation bilaterally. No wheezes, rhonchi, or rales. Cardiovascular: Regular rate and rhythm, no murmurs, rubs, or gallops.  Abdomen: Normoactive bowel sounds. Soft, nondistended, nontender to palpation. No masses or hepatosplenomegaly appreciated. No palpable fluid wave.  Extremities: Grossly normal range of motion. Warm, well perfused. No edema bilaterally.  Skin: No rashes or lesions.  Lymphatics: No cervical, supraclavicular, or inguinal adenopathy.  GU: Normal appearing external genitalia without erythema, excoriation, or lesions.  On speculum exam, no discharge noted.  With manipulation of the speculum, there is very minimal oozing from atrophic vaginal apex and cervix.  Significant radiation changes noted.  Cervix is now flush with the vaginal apex.  No lesions noted.  On bimanual exam, cervix is flush with the vaginal apex, no firmness or nodularity. Rectovaginal exam confirms findings.  Laboratory & Radiologic Studies: None new  Assessment & Plan: KATILIN IM is a 57 y.o. woman with a history of Stage IIB adenocarcinoma of the cervix who presents for surveillance. Primary chemoRT completed 07/2021. 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 Last pap 05/2022, NIML and HR HPV negative.   Doing well, NED on exam today.     Patient has been using vaginal estrogen although does not think this has helped at all with scant spotting related to intercourse.  Discussed that she can continue to use or can continue using lubricants around the time of intercourse.   Per NCCN surveillance  recommendations, we discussed plan for surveillance visits every 3 months.  She sees Dr. Roselind Messier in February and will return to see me in 6 months.  She  will need yearly cotesting, due next in 05/2023.  We reviewed signs and symptoms that should prompt a phone call between scheduled visits.  20 minutes of total time was spent for this patient encounter, including preparation, face-to-face counseling with the patient and coordination of care, and documentation of the encounter.  Eugene Garnet, MD  Division of Gynecologic Oncology  Department of Obstetrics and Gynecology  [facility]

## 2023-03-03 NOTE — Progress Notes (Signed)
 Radiation Oncology         [phone] ________________________________  Name: Robin Steele MRN: [id number]  Date: 03/04/2023  DOB: 1965/04/18  Follow-Up Visit Note  CC: Sheela Stack  Carver Fila, MD  No diagnosis found.  Diagnosis: The encounter diagnosis was Cervical adenocarcinoma (HCC).   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 +)  Interval Since Last Radiation: 1 year, 2 months, and 18 days   Intent: Curative  Radiation Treatment Dates: 04/13/2021 through 07/13/2021 (Pelvic IMRT: 04/13/21 through 05/30/21) (HDR: 06/01/21 through 07/13/21) Site Technique Total Dose (Gy) Dose per Fx (Gy) Completed Fx Beam Energies  Pelvis: Pelvis IMRT 45/45 1.8 25/25 6X  Pelvis: Pelvis_Bst 3D 9/9 1.8 5/5 15X  Cervix: Cervix_Bst_Fx1 HDR-brachy 5.5/5.5 5.5 1/1 Ir-192  Cervix: Cervix_Bst_Fx2 HDR-brachy 5.5/5.5 5.5 1/1 Ir-192  Cervix: Cervix_Bst_Fx3 HDR-brachy 5.5/5.5 5.5 1/1 Ir-192  Cervix: Cervix_Bst_Fx4 HDR-brachy 5.5/5.5 5.5 1/1 Ir-192  Cervix: Cervix_Bst_Fx5 HDR-brachy 5.5/5.5 5.5 1/1 Ir-192    Narrative:  The patient returns today for routine follow-up. She was last seen here for follow-up on 08/23/22.   During her most recent visit with Dr. Pricilla Holm on 11/30/22, the patient endorsed some very mild and scant spotting / discharge after having intercourse. She is on vaginal estrogen for management of this which Dr. Pricilla Holm has advised her to continue, along with using lubricants during intercourse. She otherwise denied any symptoms concerning for disease recurrence at that time, and was noted as NED on examination.        No other significant interval history since the patient was last seen for follow-up.   ***                          Allergies:  has no known allergies.  Meds: Current Outpatient Medications  Medication Sig Dispense Refill   Ascorbic Acid (VITAMIN C) 1000 MG tablet Take 1,000 mg by mouth daily.     cholecalciferol (VITAMIN D3) 25 MCG (1000 UNIT)  tablet Take 2,000 Units by mouth daily.     conjugated estrogens (PREMARIN) vaginal cream Place 1 Applicatorful vaginally 3 (three) times a week. 42.5 g 1   Cyanocobalamin (B-12) 5000 MCG CAPS Take 5,000 mcg by mouth daily.     DULoxetine (CYMBALTA) 60 MG capsule Take by mouth at bedtime.     EYSUVIS 0.25 % SUSP Apply to eye in the morning, at noon, and at bedtime.     Magnesium Oxide 250 MG TABS Take 1 tablet by mouth every morning.     Multiple Vitamins-Minerals (CENTRUM SILVER 50+WOMEN) TABS Take 1 tablet by mouth daily.     Omega-3 1000 MG CAPS Take 1,000 mg by mouth daily.     Red Yeast Rice 600 MG TABS Take 1,200 mg by mouth daily.     temazepam (RESTORIL) 30 MG capsule Take by mouth at bedtime.     Turmeric Curcumin 500 MG CAPS Take 1 capsule by mouth daily.     valACYclovir (VALTREX) 500 MG tablet Take 500 mg by mouth at bedtime.     No current facility-administered medications for this encounter.    Physical Findings: The patient is in no acute distress. Patient is alert and oriented.  vitals were not taken for this visit. .  No significant changes. Lungs are clear to auscultation bilaterally. Heart has regular rate and rhythm. No palpable cervical, supraclavicular, or axillary adenopathy. Abdomen soft, non-tender, normal bowel sounds.  On pelvic examination the external genitalia were  unremarkable. A speculum exam was performed. There are no mucosal lesions noted in the vaginal vault. A Pap smear was obtained of the proximal vagina. On bimanual and rectovaginal examination there were no pelvic masses appreciated. ***   Lab Findings: Lab Results  Component Value Date   WBC 3.4 (L) 10/13/2021   HGB 11.5 (L) 10/13/2021   HCT 34.6 (L) 10/13/2021   MCV 84.0 10/13/2021   PLT 200 10/13/2021    Radiographic Findings: No results found.  Impression:  The encounter diagnosis was Cervical adenocarcinoma (HCC).   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  ***  Plan:  ***   *** minutes of total time was spent for this patient encounter, including preparation, face-to-face counseling with the patient and coordination of care, physical exam, and documentation of the encounter. ____________________________________  Billie Lade, PhD, MD  This document serves as a record of services personally performed by Antony Blackbird, MD. It was created on his behalf by Neena Rhymes, a trained medical scribe. The creation of this record is based on the scribe's personal observations and the provider's statements to them. This document has been checked and approved by the attending provider.

## 2023-03-04 ENCOUNTER — Encounter: Payer: Self-pay | Admitting: Radiation Oncology

## 2023-03-04 ENCOUNTER — Ambulatory Visit
Admission: RE | Admit: 2023-03-04 | Discharge: 2023-03-04 | Disposition: A | Discharge status: Home / Self Care | Payer: Managed Care, Other (non HMO) | Source: Ambulatory Visit | Attending: Radiation Oncology | Admitting: Radiation Oncology

## 2023-03-04 VITALS — BP 123/83 | HR 66 | Temp 97.7°F | Resp 18 | Ht 65.0 in | Wt 153.4 lb

## 2023-03-04 DIAGNOSIS — C531 Malignant neoplasm of exocervix: Secondary | ICD-10-CM | POA: Insufficient documentation

## 2023-03-04 DIAGNOSIS — Z79899 Other long term (current) drug therapy: Secondary | ICD-10-CM | POA: Diagnosis not present

## 2023-03-04 DIAGNOSIS — Z923 Personal history of irradiation: Secondary | ICD-10-CM | POA: Insufficient documentation

## 2023-03-04 DIAGNOSIS — C539 Malignant neoplasm of cervix uteri, unspecified: Secondary | ICD-10-CM

## 2023-03-04 NOTE — Progress Notes (Signed)
 Robin Steele is here today for follow up post radiation to the pelvic.  They completed their radiation on: 07/13/21   Does the patient complain of any of the following:  Pain: No Abdominal bloating: yes, reports feeling full of gas.  Diarrhea/Constipation: No, more frequency bowel movements.  Nausea/Vomiting: No Vaginal Discharge: No Blood in Urine or Stool: No Urinary Issues (dysuria/incomplete emptying/ incontinence/ increased frequency/urgency): No Does patient report using vaginal dilator 2-3 times a week and/or sexually active 2-3 weeks: Yes patient sexually active.  Post radiation skin changes: No   Additional comments if applicable:  Patient last pap smear was on 05/31/22.   BP 123/83 (BP Location: Left Arm, Patient Position: Sitting)   Pulse 66   Temp 97.7 F (36.5 C) (Temporal)   Resp 18   Ht 5\' 5"  (1.651 m)   Wt 153 lb 6 oz (69.6 kg)   LMP 12/18/2018   SpO2 100%   BMI 25.52 kg/m

## 2023-03-14 ENCOUNTER — Other Ambulatory Visit (INDEPENDENT_AMBULATORY_CARE_PROVIDER_SITE_OTHER): Payer: Self-pay

## 2023-03-14 ENCOUNTER — Ambulatory Visit: Admitting: Orthopaedic Surgery

## 2023-03-14 ENCOUNTER — Encounter: Payer: Self-pay | Admitting: Orthopaedic Surgery

## 2023-03-14 DIAGNOSIS — M79641 Pain in right hand: Secondary | ICD-10-CM

## 2023-03-14 NOTE — Progress Notes (Signed)
 Office Visit Note   Patient: Robin Steele           Date of Birth: 1965-10-04           MRN: [id number] Visit Date: 03/14/2023              Requested by: Royann Shivers, PA-C [address] [place],  [place] [address] PCP: Royann Shivers, PA-C   Assessment & Plan: Visit Diagnoses:  1. Pain in right hand     Plan: Patient can apply some Aspercreme index and middle finger.  If the cyst gets larger and painful on her middle finger PIP joint she could see Dr.Arguwala.   Follow-Up Instructions: No follow-ups on file.   Orders:  Orders Placed This Encounter  Procedures   XR Hand Complete Right   No orders of the defined types were placed in this encounter.     Procedures: No procedures performed   Clinical Data: No additional findings.   Subjective: Chief Complaint  Patient presents with   Right Hand - Pain    HPI 58 year old female still working noticed a small knot dorsally over the PIP joint middle finger right hand that is not painful.  More noticeable with complete flexion.  She has no extension lag no numbness in the finger.  She has also noticed some discomfort and stiffness in the index PIP joint although with some effort fingertip can touch distal palmar crease.  No rash.  Patient is right-hand dominant.  Review of Systems past treatment for right trigger thumb previous cervical fusion doing well.   Objective: Vital Signs: LMP 12/18/2018   Physical Exam Constitutional:      Appearance: She is well-developed.  HENT:     Head: Normocephalic.     Right Ear: External ear normal.     Left Ear: External ear normal. There is no impacted cerumen.  Eyes:     Pupils: Pupils are equal, round, and reactive to light.  Neck:     Thyroid: No thyromegaly.     Trachea: No tracheal deviation.  Cardiovascular:     Rate and Rhythm: Normal rate.  Pulmonary:     Effort: Pulmonary effort is normal.  Abdominal:     Palpations: Abdomen is soft.   Musculoskeletal:     Cervical back: No rigidity.  Skin:    General: Skin is warm and dry.  Neurological:     Mental Status: She is alert and oriented to person, place, and time.  Psychiatric:        Behavior: Behavior normal.     Ortho Exam right middle finger small palpable nodule more noticeable with flexion just distal to the central slip slightly mobile.  She has full flexion full extension collateral ligaments are stable.  Slight swelling index PIP joint full extension no extension lag sensation the tip capillary refill is normal.  Specialty Comments:  No specialty comments available.  Imaging: No results found.   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   S/P cervical  spinal fusion 11/18/2020   Spinal stenosis of cervical region 11/10/2020   Past Medical History:  Diagnosis Date   Anxiety    Arthritis 2011   in neck and back   Cervical cancer (HCC)    Depression    Difficulty sleeping    Dry eyes    Family history of breast cancer 05/08/2021   Family history of colon cancer 05/08/2021   Family history of uterine cancer 05/08/2021   History of basal cell carcinoma (BCC) excision 2022   skin cancer, 2 areas removed per pt   History of COVID-19    01-2020 and 01-2021 all symptoms resolved   History of radiation therapy    Cervix- 04/23/21-07/13/21- Dr. Antony Blackbird   Hyperlipidemia    Pre-diabetes 2022   borderline, hgba1c 5.7 in august 2022    Family History  Problem Relation  Age of Onset   Cancer Mother        maligant tumor on arm; dx 30s   Endometrial cancer Mother        dx 59s   Breast cancer Maternal Aunt        dx unknown age   Colon cancer Maternal Grandmother        dx unknown age   Uterine cancer Half-Sister        paternal half sister; dx before age 60   Skin cancer Half-Sister        ? melanoma; d. 97   Cervical cancer Other        mother's maternal half sister; dx after 35   Colon cancer Other        MGM's mother; dx unknown age   Prostate cancer Neg Hx    Ovarian cancer Neg Hx     Past Surgical History:  Procedure Laterality Date   ANTERIOR CERVICAL DECOMP/DISCECTOMY FUSION N/A 11/18/2020   Procedure: CERVICAL FOUR -CERVICAL FIVE  ANTERIOR CERVICAL DISCECTOMY FUSION, ALLOGRAFT, PLATE;  Surgeon: Eldred Manges, MD;  Location: [facility];  Service: Orthopedics;  Laterality: N/A;   BASAL CELL CARCINOMA EXCISION  2022   pt has had areas removed in the past as well   CERVICAL CONE BIOPSY  02/15/2021   HERNIA REPAIR     pt states she had an umbilical hernia repair about 25 years ago (around 1997), unsure if repaired with mesh   IR IMAGING GUIDED PORT INSERTION  04/11/2021   IR REMOVAL TUN ACCESS W/ PORT W/O FL MOD SED  12/11/2021   OPERATIVE ULTRASOUND N/A 06/01/2021   Procedure: OPERATIVE ULTRASOUND;  Surgeon: Antony Blackbird, MD;  Location: [facility];  Service: Urology;  Laterality: N/A;   OPERATIVE ULTRASOUND N/A 06/19/2021   Procedure: OPERATIVE ULTRASOUND;  Surgeon: Antony Blackbird, MD;  Location: [facility];  Service: Urology;  Laterality: N/A;   OPERATIVE ULTRASOUND N/A 06/29/2021   Procedure: OPERATIVE ULTRASOUND;  Surgeon: Antony Blackbird, MD;  Location: [facility];  Service: Urology;  Laterality: N/A;   OPERATIVE ULTRASOUND N/A 07/06/2021   Procedure: OPERATIVE ULTRASOUND;  Surgeon: Antony Blackbird, MD;  Location: [facility];  Service: Urology;  Laterality: N/A;   OPERATIVE ULTRASOUND N/A  07/13/2021   Procedure: OPERATIVE ULTRASOUND;  Surgeon: Antony Blackbird, MD;  Location: [facility];  Service: Urology;  Laterality: N/A;   TANDEM RING INSERTION N/A 06/01/2021   Procedure: TANDEM RING INSERTION;  Surgeon: Antony Blackbird, MD;  Location: [facility];  Service: Urology;  Laterality: N/A;   TANDEM RING  INSERTION N/A 06/19/2021   Procedure: TANDEM RING INSERTION;  Surgeon: Antony Blackbird, MD;  Location: [facility];  Service: Urology;  Laterality: N/A;   TANDEM RING INSERTION N/A 06/29/2021   Procedure: TANDEM RING INSERTION;  Surgeon: Antony Blackbird, MD;  Location: [facility];  Service: Urology;  Laterality: N/A;   TANDEM RING INSERTION N/A 07/06/2021   Procedure: TANDEM RING INSERTION;  Surgeon: Antony Blackbird, MD;  Location[facility];  Service: Urology;  Laterality: N/A;   TANDEM RING INSERTION N/A 07/13/2021   Procedure: TANDEM RING INSERTION;  Surgeon: Antony Blackbird, MD;  Location: [facility];  Service: Urology;  Laterality: N/A;   Social History   Occupational History   Not on file  Tobacco Use   Smoking status: Never   Smokeless tobacco: Never  Vaping Use   Vaping status: Never Used  Substance and Sexual Activity   Alcohol use: Yes    Comment: very occasional, maybe once or twice a year (1 glass of wine)   Drug use: Never   Sexual activity: Yes

## 2023-03-21 ENCOUNTER — Encounter: Payer: Self-pay | Admitting: Nurse Practitioner

## 2023-04-09 NOTE — Progress Notes (Unsigned)
 CARDIOLOGY CONSULT NOTE       Patient ID: Robin Steele MRN: [id number] DOB/AGE: [birth year] 58 y.o.   Referring Physician: Vernice Jefferson Primary Physician: Sheela Stack Primary Cardiologist: New Reason for Consultation: Palpitations   HPI:  58 y.o. referred by Dr Vernice Jefferson for palpitations. History of GAD, cervical cancer with XRT 2023, hyperplastic rectal polyp removed 2023  And HLD.  Seen by Dr Wyline Mood 2022 for palpitations. Mother with MI age 13 No w/u PRN f/u and cleared for anterior cervical fusion. No history of PAF, SVT, WPW. No history of thyroid dx or anemia.   I took care of her mom Angie. Patient just wants to have heart doctor given her family history of premature CAD/MI   She has one son she is close to One estranged daughter. She has a boyfriend 15 years No chest pain, or syncope. She complains of fatigue and some dyspnea over last few months   ROS All other systems reviewed and negative except as noted above  Past Medical History:  Diagnosis Date   Anxiety    Arthritis 2011   in neck and back   Cervical cancer (HCC)    Depression    Difficulty sleeping    Dry eyes    Family history of breast cancer 05/08/2021   Family history of colon cancer 05/08/2021   Family history of uterine cancer 05/08/2021   History of basal cell carcinoma (BCC) excision 2022   skin cancer, 2 areas removed per pt   History of COVID-19    01-2020 and 01-2021 all symptoms resolved   History of radiation therapy    Cervix- 04/23/21-07/13/21- Dr. Antony Blackbird   Hyperlipidemia    Pre-diabetes 2022   borderline, hgba1c 5.7 in august 2022    Family History  Problem Relation Age of Onset   Cancer Mother        maligant tumor on arm; dx 30s   Endometrial cancer Mother        dx 30s   Breast cancer Maternal Aunt        dx unknown age   Colon cancer Maternal Grandmother        dx unknown age   Uterine cancer Half-Sister        paternal half sister; dx before age 22   Skin  cancer Half-Sister        ? melanoma; d. 32   Cervical cancer Other        mother's maternal half sister; dx after 15   Colon cancer Other        MGM's mother; dx unknown age   Prostate cancer Neg Hx    Ovarian cancer Neg Hx     Social History   Socioeconomic History   Marital status: Single    Spouse name: Not on file   Number of children: Not on file   Years of education: Not on file   Highest education level: Not on file  Occupational History   Not on file  Tobacco Use   Smoking status: Never   Smokeless tobacco: Never  Vaping Use   Vaping status: Never Used  Substance and Sexual Activity   Alcohol use: Yes    Comment: very occasional, maybe once or twice a year (1 glass of wine)   Drug use: Never   Sexual activity: Yes  Other Topics Concern   Not on file  Social History Narrative   Not on file   Social Drivers of Health   Financial  Resource Strain: Not on file  Food Insecurity: Not on file  Transportation Needs: Not on file  Physical Activity: Not on file  Stress: Not on file  Social Connections: Not on file  Intimate Partner Violence: Not At Risk (03/08/2021)   Received from [facility], [facility]   Humiliation, Afraid, Rape, and Kick questionnaire    Fear of Current or Ex-Partner: No    Emotionally Abused: No    Physically Abused: No    Sexually Abused: No    Past Surgical History:  Procedure Laterality Date   ANTERIOR CERVICAL DECOMP/DISCECTOMY FUSION N/A 11/18/2020   Procedure: CERVICAL FOUR -CERVICAL FIVE  ANTERIOR CERVICAL DISCECTOMY FUSION, ALLOGRAFT, PLATE;  Surgeon: Eldred Manges, MD;  Location: [facility];  Service: Orthopedics;  Laterality: N/A;   BASAL CELL CARCINOMA EXCISION  2022   pt has had areas removed in the past as well   CERVICAL CONE BIOPSY  02/15/2021   HERNIA REPAIR     pt states she had an umbilical hernia repair about 25 years ago (around 1997), unsure if repaired with mesh   IR IMAGING GUIDED PORT INSERTION  04/11/2021   IR  REMOVAL TUN ACCESS W/ PORT W/O FL MOD SED  12/11/2021   OPERATIVE ULTRASOUND N/A 06/01/2021   Procedure: OPERATIVE ULTRASOUND;  Surgeon: Antony Blackbird, MD;  Location: [facility];  Service: Urology;  Laterality: N/A;   OPERATIVE ULTRASOUND N/A 06/19/2021   Procedure: OPERATIVE ULTRASOUND;  Surgeon: Antony Blackbird, MD;  Location: [facility];  Service: Urology;  Laterality: N/A;   OPERATIVE ULTRASOUND N/A 06/29/2021   Procedure: OPERATIVE ULTRASOUND;  Surgeon: Antony Blackbird, MD;  Location: [facility];  Service: Urology;  Laterality: N/A;   OPERATIVE ULTRASOUND N/A 07/06/2021   Procedure: OPERATIVE ULTRASOUND;  Surgeon: Antony Blackbird, MD;  Location: [facility];  Service: Urology;  Laterality: N/A;   OPERATIVE ULTRASOUND N/A 07/13/2021   Procedure: OPERATIVE ULTRASOUND;  Surgeon: Antony Blackbird, MD;  Location: [facility];  Service: Urology;  Laterality: N/A;   TANDEM RING INSERTION N/A 06/01/2021   Procedure: TANDEM RING INSERTION;  Surgeon: Antony Blackbird, MD;  Location: [facility];  Service: Urology;  Laterality: N/A;   TANDEM RING INSERTION N/A 06/19/2021   Procedure: TANDEM RING INSERTION;  Surgeon: Antony Blackbird, MD;  Location: [facility];  Service: Urology;  Laterality: N/A;   TANDEM RING INSERTION N/A 06/29/2021   Procedure: TANDEM RING INSERTION;  Surgeon: Antony Blackbird, MD;  Location: [facility];  Service: Urology;  Laterality: N/A;   TANDEM RING INSERTION N/A 07/06/2021   Procedure: TANDEM RING INSERTION;  Surgeon: Antony Blackbird, MD;  Location: [facility];  Service: Urology;  Laterality: N/A;   TANDEM RING INSERTION N/A 07/13/2021   Procedure: TANDEM RING INSERTION;  Surgeon: Antony Blackbird, MD;  Location: [facility];  Service: Urology;  Laterality: N/A;      Current Outpatient Medications:    Ascorbic Acid (VITAMIN C) 1000 MG tablet, Take 1,000  mg by mouth daily., Disp: , Rfl:    cholecalciferol (VITAMIN D3) 25 MCG (1000 UNIT) tablet, Take 2,000 Units by mouth daily., Disp: , Rfl:    Cyanocobalamin (B-12) 5000 MCG CAPS, Take 5,000 mcg by mouth daily., Disp: , Rfl:    DULoxetine (CYMBALTA) 60 MG capsule, Take by mouth at bedtime., Disp: , Rfl:    EYSUVIS 0.25 % SUSP, Apply to eye in the morning, at noon, and  at bedtime., Disp: , Rfl:    Maca 500 MG CAPS, Take 500 mg by mouth daily., Disp: , Rfl:    Magnesium Oxide 250 MG TABS, Take 1 tablet by mouth every morning., Disp: , Rfl:    MIEBO 1.338 GM/ML SOLN, Place 1 drop into both eyes QID., Disp: , Rfl:    Multiple Vitamins-Minerals (CENTRUM SILVER 50+WOMEN) TABS, Take 1 tablet by mouth daily., Disp: , Rfl:    Omega-3 1000 MG CAPS, Take 1,000 mg by mouth daily., Disp: , Rfl:    Red Yeast Rice 600 MG TABS, Take 1,200 mg by mouth daily., Disp: , Rfl:    temazepam (RESTORIL) 30 MG capsule, Take by mouth at bedtime., Disp: , Rfl:    valACYclovir (VALTREX) 500 MG tablet, Take 500 mg by mouth at bedtime., Disp: , Rfl:    conjugated estrogens (PREMARIN) vaginal cream, Place 1 Applicatorful vaginally 3 (three) times a week. (Patient not taking: Reported on 03/04/2023), Disp: 42.5 g, Rfl: 1   Turmeric Curcumin 500 MG CAPS, Take 1 capsule by mouth daily., Disp: , Rfl:     Physical Exam: Blood pressure 120/78, pulse 66, height 5\' 5"  (1.651 m), weight 153 lb 9.6 oz (69.7 kg), last menstrual period 12/18/2018, SpO2 97%.    Affect appropriate Healthy:  appears stated age HEENT: normal Neck supple with no adenopathy JVP normal no bruits no thyromegaly Lungs clear with no wheezing and good diaphragmatic motion Heart:  S1/S2 no murmur, no rub, gallop or click PMI normal Abdomen: benighn, BS positve, no tenderness, no AAA no bruit.  No HSM or HJR Distal pulses intact with no bruits No edema Neuro non-focal Skin warm and dry No muscular weakness   Labs:   Lab Results  Component Value Date    WBC 3.4 (L) 10/13/2021   HGB 11.5 (L) 10/13/2021   HCT 34.6 (L) 10/13/2021   MCV 84.0 10/13/2021   PLT 200 10/13/2021   No results for input(s): "NA", "K", "CL", "CO2", "BUN", "CREATININE", "CALCIUM", "PROT", "BILITOT", "ALKPHOS", "ALT", "AST", "GLUCOSE" in the last 168 hours.  Invalid input(s): "LABALBU" No results found for: "CKTOTAL", "CKMB", "CKMBINDEX", "TROPONINI" No results found for: "CHOL" No results found for: "HDL" No results found for: "LDLCALC" No results found for: "TRIG" No results found for: "CHOLHDL" No results found for: "LDLDIRECT"    Radiology: No results found.   EKG: SR rate 66 biphasic lateral T waves    ASSESSMENT AND PLAN:   Palpitations:  2023 benign. No w/u done. Not recurrent Anxiety:  takes cymbalta and restoril  Polyps:  colonsocopy 2023 f/u 2028 per GI Cervical Cancer: stage 2B Pap smears negative f/u Dr Pricilla Holm and Roselind Messier CAD Risk:  calcium score given family history Fatigue/Dyspnea Normal ECG/Exam check echo for EF   Echo Calcium score   F/U in a year   Signed: Charlton Haws 04/15/2023, 10:17 AM

## 2023-04-15 ENCOUNTER — Ambulatory Visit: Attending: Cardiovascular Disease | Admitting: Cardiovascular Disease

## 2023-04-15 ENCOUNTER — Encounter: Payer: Self-pay | Admitting: Cardiovascular Disease

## 2023-04-15 ENCOUNTER — Ambulatory Visit: Admitting: Cardiovascular Disease

## 2023-04-15 VITALS — BP 120/78 | HR 66 | Ht 65.0 in | Wt 153.6 lb

## 2023-04-15 DIAGNOSIS — R072 Precordial pain: Secondary | ICD-10-CM | POA: Diagnosis not present

## 2023-04-15 DIAGNOSIS — R0609 Other forms of dyspnea: Secondary | ICD-10-CM | POA: Diagnosis not present

## 2023-04-15 DIAGNOSIS — R002 Palpitations: Secondary | ICD-10-CM | POA: Diagnosis not present

## 2023-04-15 DIAGNOSIS — Z8249 Family history of ischemic heart disease and other diseases of the circulatory system: Secondary | ICD-10-CM

## 2023-04-15 NOTE — Patient Instructions (Signed)
 Medication Instructions:  Your physician recommends that you continue on your current medications as directed. Please refer to the Current Medication list given to you today.  *If you need a refill on your cardiac medications before your next appointment, please call your pharmacy*  Lab Work: If you have labs (blood work) drawn today and your tests are completely normal, you will receive your results only by: MyChart Message (if you have MyChart) OR A paper copy in the mail If you have any lab test that is abnormal or we need to change your treatment, we will call you to review the results.  Testing/Procedures: Your physician has requested that you have an echocardiogram. Echocardiography is a painless test that uses sound waves to create images of your heart. It provides your doctor with information about the size and shape of your heart and how well your heart's chambers and valves are working. This procedure takes approximately one hour. There are no restrictions for this procedure. Please do NOT wear cologne, perfume, aftershave, or lotions (deodorant is allowed). Please arrive 15 minutes prior to your appointment time.  Please note: We ask at that you not bring children with you during ultrasound (echo/ vascular) testing. Due to room size and safety concerns, children are not allowed in the ultrasound rooms during exams. Our front office staff cannot provide observation of children in our lobby area while testing is being conducted. An adult accompanying a patient to their appointment will only be allowed in the ultrasound room at the discretion of the ultrasound technician under special circumstances. We apologize for any inconvenience. CT scanning for a cardiac calcium score (CAT scanning), is a noninvasive, special x-ray that produces cross-sectional images of the body using x-rays and a computer. CT scans help physicians diagnose and treat medical conditions. For some CT exams, a contrast  material is used to enhance visibility in the area of the body being studied. CT scans provide greater clarity and reveal more details than regular x-ray exams.   Follow-Up: At [facility], you and your health needs are our priority.  As part of our continuing mission to provide you with exceptional heart care, our providers are all part of one team.  This team includes your primary Cardiologist (physician) and Advanced Practice Providers or APPs (Physician Assistants and Nurse Practitioners) who all work together to provide you with the care you need, when you need it.  Your next appointment:   1 year(s)  Provider:    Dr. Eden Emms    We recommend signing up for the patient portal called "MyChart".  Sign up information is provided on this After Visit Summary.  MyChart is used to connect with patients for Virtual Visits (Telemedicine).  Patients are able to view lab/test results, encounter notes, upcoming appointments, etc.  Non-urgent messages can be sent to your provider as well.   To learn more about what you can do with MyChart, go to [email or web address].   Other Instructions       1st Floor: - Lobby - Registration  - Pharmacy  - Lab - Cafe  2nd Floor: - PV Lab - Diagnostic Testing (echo, CT, nuclear med)  3rd Floor: - Vacant  4th Floor: - TCTS (cardiothoracic surgery) - AFib Clinic - Structural Heart Clinic - Vascular Surgery  - Vascular Ultrasound  5th Floor: - [facility] Card[facility] (general and EP) - Clinical Pharmacy for coumadin, hypertension, lipid, weight-loss medications, and med management appointments    Valet parking services will be available as well.

## 2023-04-16 ENCOUNTER — Telehealth: Payer: Self-pay | Admitting: *Deleted

## 2023-04-16 NOTE — Telephone Encounter (Signed)
 Per provider moved appt from 5/22 to 5/30, patient aware of new date/time

## 2023-04-26 ENCOUNTER — Ambulatory Visit (HOSPITAL_COMMUNITY)
Admission: RE | Admit: 2023-04-26 | Discharge: 2023-04-26 | Disposition: A | Discharge status: Home / Self Care | Source: Ambulatory Visit | Attending: Cardiovascular Disease | Admitting: Cardiovascular Disease

## 2023-04-26 ENCOUNTER — Ambulatory Visit (HOSPITAL_COMMUNITY)
Admission: RE | Admit: 2023-04-26 | Discharge: 2023-04-26 | Disposition: A | Discharge status: Home / Self Care | Payer: Self-pay | Source: Ambulatory Visit | Attending: Cardiovascular Disease | Admitting: Cardiovascular Disease

## 2023-04-26 DIAGNOSIS — R002 Palpitations: Secondary | ICD-10-CM | POA: Diagnosis present

## 2023-04-26 DIAGNOSIS — R0609 Other forms of dyspnea: Secondary | ICD-10-CM

## 2023-04-26 DIAGNOSIS — R072 Precordial pain: Secondary | ICD-10-CM | POA: Diagnosis present

## 2023-04-26 DIAGNOSIS — Z8249 Family history of ischemic heart disease and other diseases of the circulatory system: Secondary | ICD-10-CM

## 2023-04-26 LAB — ECHOCARDIOGRAM COMPLETE
AR max vel: 2.64 cm2
AV Area VTI: 2.73 cm2
AV Area mean vel: 2.66 cm2
AV Mean grad: 2 mmHg
AV Peak grad: 5.7 mmHg
Ao pk vel: 1.19 m/s
Area-P 1/2: 3.01 cm2
Calc EF: 65.6 %
MV VTI: 2.64 cm2
S' Lateral: 2.6 cm
Single Plane A2C EF: 56.8 %
Single Plane A4C EF: 69.7 %

## 2023-04-26 NOTE — Progress Notes (Signed)
*  PRELIMINARY RESULTS* Echocardiogram 2D Echocardiogram has been performed.  Robin Steele 04/26/2023, 10:25 AM

## 2023-05-13 ENCOUNTER — Other Ambulatory Visit: Payer: Self-pay | Admitting: Nurse Practitioner

## 2023-05-13 DIAGNOSIS — Z1231 Encounter for screening mammogram for malignant neoplasm of breast: Secondary | ICD-10-CM

## 2023-05-16 ENCOUNTER — Telehealth: Payer: Self-pay | Admitting: *Deleted

## 2023-05-16 NOTE — Telephone Encounter (Signed)
 confirmed appointment for Monday 05/20/23

## 2023-05-20 ENCOUNTER — Ambulatory Visit (INDEPENDENT_AMBULATORY_CARE_PROVIDER_SITE_OTHER): Payer: Managed Care, Other (non HMO) | Admitting: Otolaryngology

## 2023-05-20 ENCOUNTER — Encounter (INDEPENDENT_AMBULATORY_CARE_PROVIDER_SITE_OTHER): Payer: Self-pay | Admitting: Otolaryngology

## 2023-05-20 ENCOUNTER — Encounter (HOSPITAL_COMMUNITY): Payer: Self-pay

## 2023-05-20 VITALS — BP 129/83 | HR 72 | Ht 65.0 in | Wt 155.0 lb

## 2023-05-20 DIAGNOSIS — H9313 Tinnitus, bilateral: Secondary | ICD-10-CM

## 2023-05-21 NOTE — Progress Notes (Signed)
 Follow-up: Bilateral tinnitus  HPI: The patient is a 58 year old female who returns today for her follow-up evaluation.  She was last seen in May 2024.  At that time, she was complaining of bilateral tinnitus. Her tinnitus started after she was treated with chemoradiation therapy.  The chemoradiation was to treat her uterine cancer.  Her cancer is currently in remission.  Since chemotherapy, she has noted a constant bilateral buzzing tinnitus.  The tinnitus is nonpulsatile.  Her chemotherapy includes the use of cisplatin .  Her hearing was normal bilaterally across all frequencies.  The patient returns today complaining of persistent bilateral tinnitus.  She denies any change in her hearing.  Currently she denies any otalgia, otorrhea, or vertigo.  Exam: General: Communicates without difficulty, well nourished, no acute distress. Head: Normocephalic, no evidence injury, no tenderness, facial buttresses intact without stepoff. Face/sinus: No tenderness to palpation and percussion. Facial movement is normal and symmetric. Eyes: PERRL, EOMI. No scleral icterus, conjunctivae clear. Neuro: CN II exam reveals vision grossly intact.  No nystagmus at any point of gaze. Ears: Auricles well formed without lesions.  Ear canals are intact without mass or lesion.  No erythema or edema is appreciated.  The TMs are intact without fluid. Nose: External evaluation reveals normal support and skin without lesions.  Dorsum is intact.  Anterior rhinoscopy reveals normal mucosa over anterior aspect of inferior turbinates and intact septum.  No purulence noted. Oral:  Oral cavity and oropharynx are intact, symmetric, without erythema or edema.  Mucosa is moist without lesions. Neck: Full range of motion without pain.  There is no significant lymphadenopathy.  No masses palpable.  Thyroid bed within normal limits to palpation.  Parotid glands and submandibular glands equal bilaterally without mass.  Trachea is midline. Neuro:  CN 2-12  grossly intact.   Assessment: 1.  Bilateral subjective tinnitus. 2.  The patient's ear canals, tympanic membranes, and middle ear spaces are normal.  No infection is noted today. 3.  Subjectively normal hearing.  Plan: 1.  The physical exam findings are reviewed with the patient. 2.  The strategies to cope with tinnitus, including the use of masker, tinnitus retraining therapy, and avoidance of caffeine and alcohol are discussed. 3.  The patient will return for reevaluation in 1 year.

## 2023-05-22 IMAGING — MR MR CERVICAL SPINE W/O CM
5 series · 38 of 48 positions shown · non-contrast
Comparison: None.

CLINICAL DATA: Myelopathy, tingling in the upper extremity with
weakness

EXAM:
MRI CERVICAL SPINE WITHOUT CONTRAST
TECHNIQUE: Multiplanar, multisequence MR imaging of the cervical spine was
performed. No intravenous contrast was administered.

[Series 5: T2 · sagittal · 3.0mm · 0.69mm/px · 6 of 15 slices shown (1 of 2)]
[im 1/15]
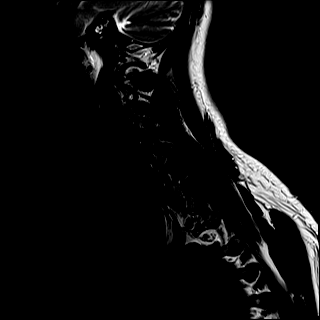
[im 3/15]
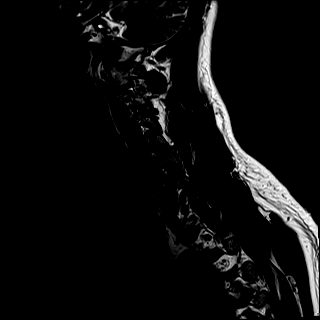
[im 6/15]
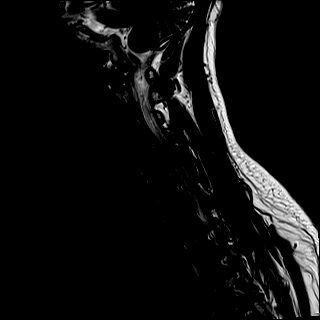
[im 9/15]
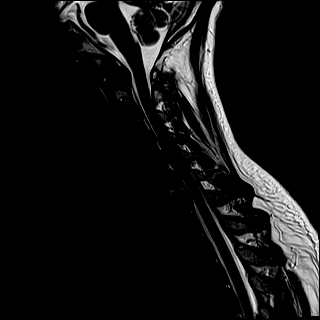
[im 12/15]
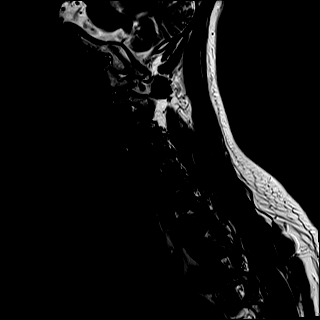
[im 15/15]
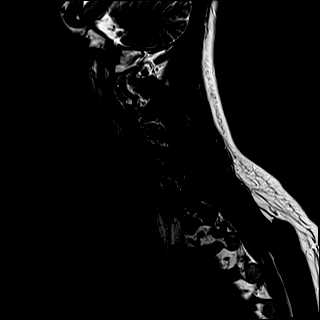

[Series 6: T1 · sagittal · 3.0mm · 0.86mm/px · 6 of 15 slices shown]
[im 1/15]
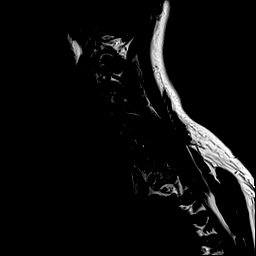
[im 3/15]
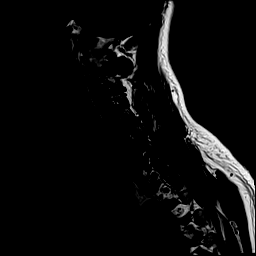
[im 6/15]
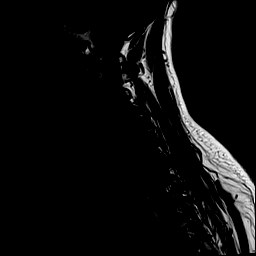
[im 9/15]
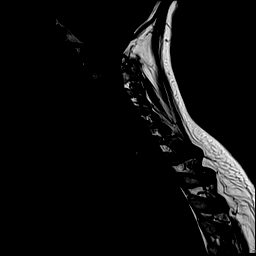
[im 12/15]
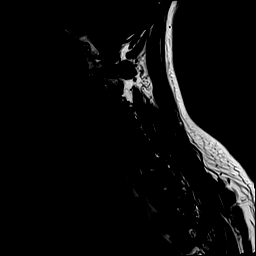
[im 15/15]
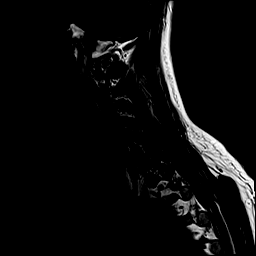

[Series 7: STIR · sagittal · 3.0mm · 0.69mm/px · 6 of 15 slices shown]
[im 1/15]
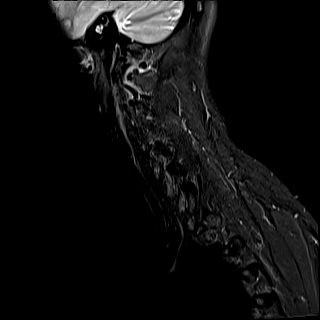
[im 3/15]
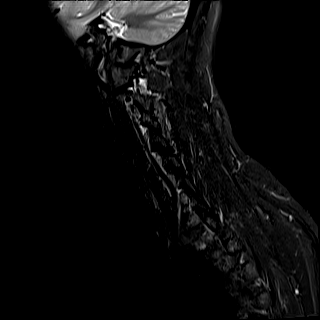
[im 6/15]
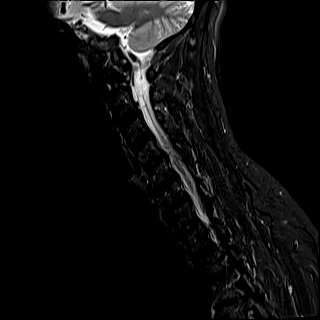
[im 9/15]
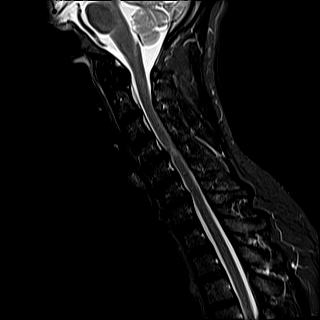
[im 12/15]
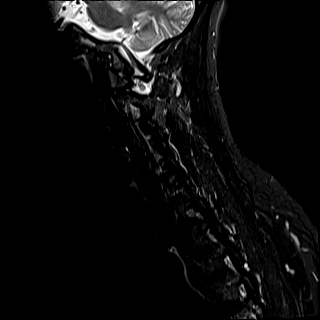
[im 15/15]
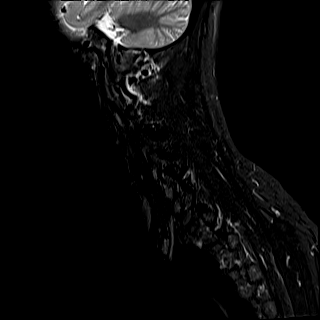

[Series 8: T2 · axial · 3.0mm · 0.70mm/px · z∈[-12,+92]mm · 12 of 35 slices shown (2 of 2)]
[im 1/35]
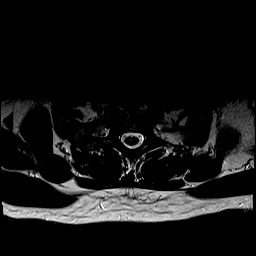
[im 3/35]
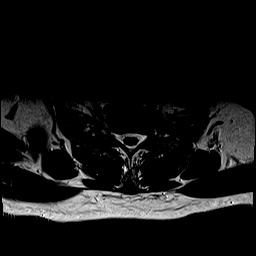
[im 5/35]
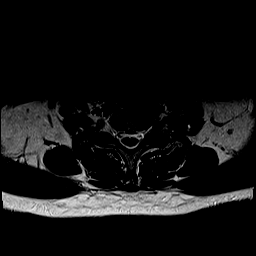
[im 8/35]
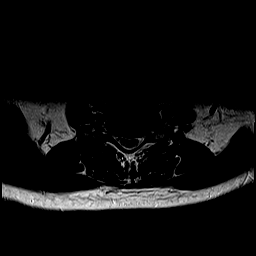
[im 10/35]
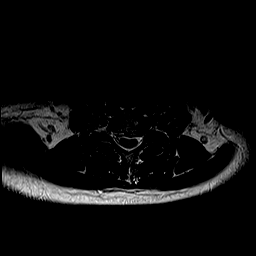
[im 13/35]
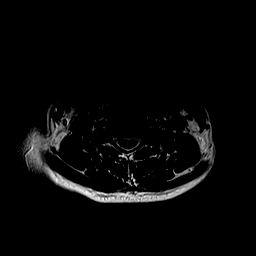
[im 15/35]
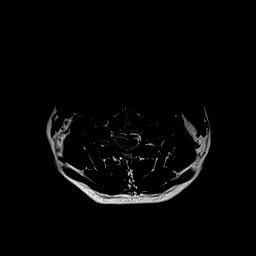
[im 18/35]
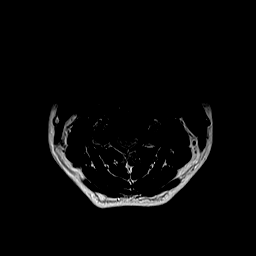
[im 20/35]
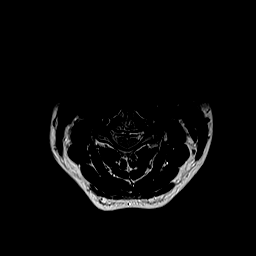
[im 25/35]
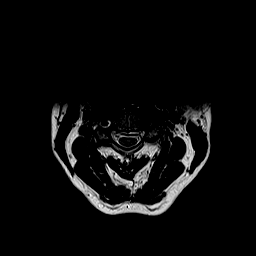
[im 30/35]
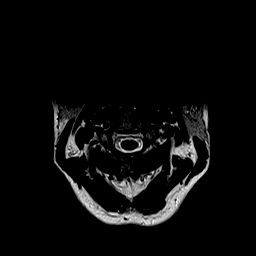
[im 35/35]
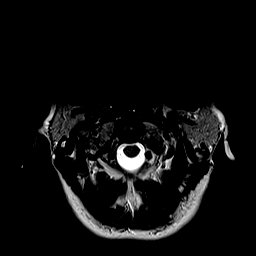

[Series 9: GRE · axial · 3.0mm · 0.35mm/px · z∈[-12,+92]mm · 8 of 35 slices shown]
[im 1/35]
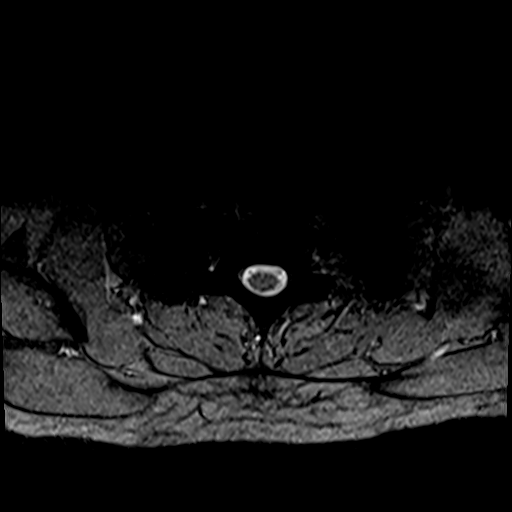
[im 5/35]
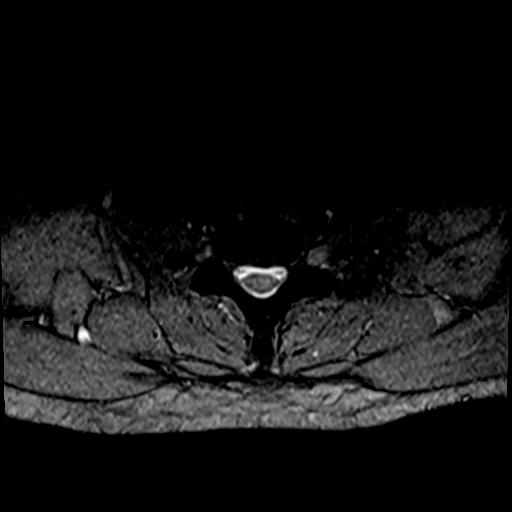
[im 10/35]
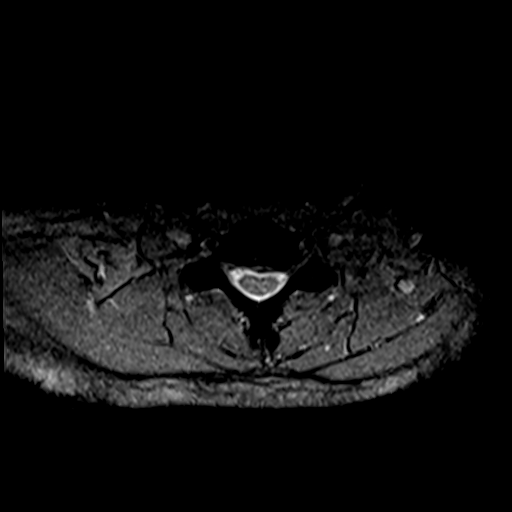
[im 15/35]
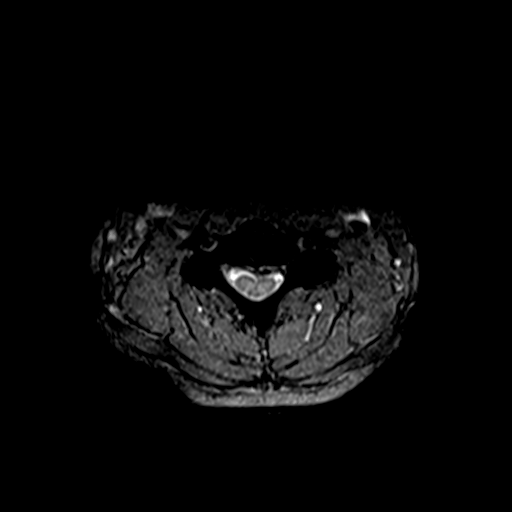
[im 20/35]
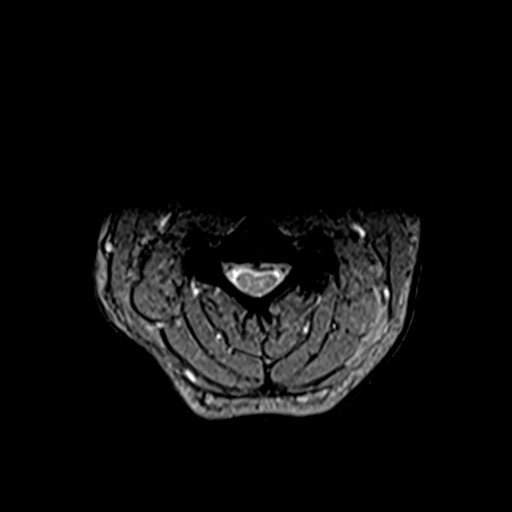
[im 25/35]
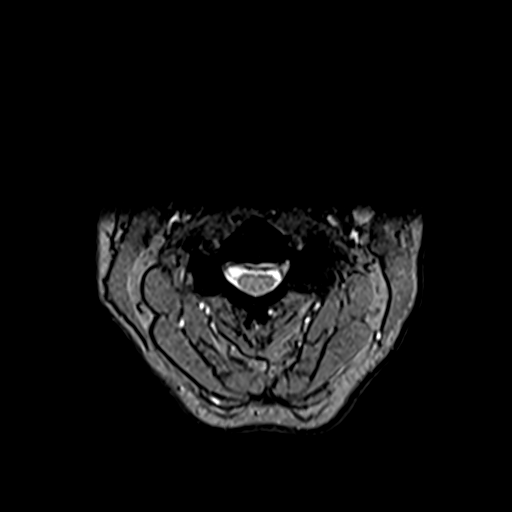
[im 30/35]
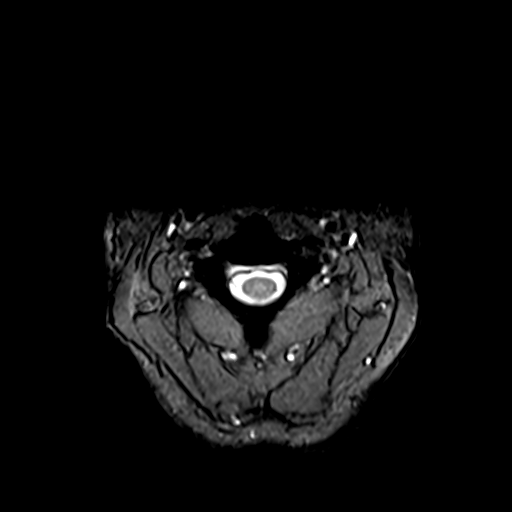
[im 35/35]
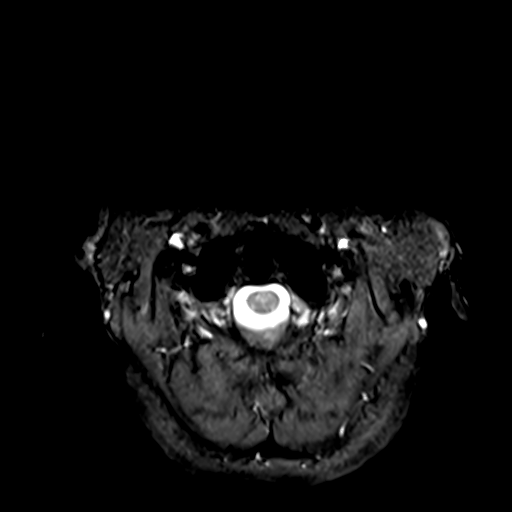

[38 of 48 positions shown; findings below may reference images not displayed]

FINDINGS: Alignment: Physiologic.

Vertebrae: No acute fracture, evidence of discitis, or bone lesion.

Cord: Mild cord edema in the cervical spinal cord at the level of
C4-5.

Posterior Fossa, vertebral arteries, paraspinal tissues: Posterior
fossa demonstrates no focal abnormality. Vertebral artery flow voids
are maintained. Paraspinal soft tissues are unremarkable.

Disc levels:

Discs: Degenerative disease with disc height loss at C5-6 and C6-7.

C2-3: No disc protrusion. Mild bilateral facet arthropathy, right
worse than left. Moderate right foraminal stenosis. No left
foraminal stenosis.

C3-4: No disc protrusion. Severe left facet arthropathy. Left
uncovertebral degenerative changes. Severe left foraminal stenosis.
Mild right foraminal stenosis. No central canal stenosis.

C4-5: Broad-based disc bulge with a central/left paracentral disc
protrusion with mass effect on the cervical spinal cord with
associated cord edema. Bilateral uncovertebral degenerative changes.
Severe bilateral foraminal stenosis. Severe spinal stenosis.

C5-6: Broad-based disc bulge. Bilateral uncovertebral degenerative
changes. Severe bilateral foraminal stenosis. Mild spinal stenosis.

C6-7: Broad-based disc bulge with a small central disc protrusion.
Bilateral uncovertebral degenerative changes. Severe left foraminal
stenosis. Mild right foraminal stenosis. No spinal stenosis.

C7-T1: No significant disc bulge. No neural foraminal stenosis. No
central canal stenosis.
IMPRESSION: 1. Diffuse cervical spine spondylosis as described above.
2. At C4-5 there is a broad-based disc bulge with a central/left
paracentral disc protrusion with mass effect on the cervical spinal
cord with associated cord edema. Bilateral uncovertebral
degenerative changes. Severe bilateral foraminal stenosis. Severe
spinal stenosis.
3.  No acute osseous injury of the cervical spine.

## 2023-05-27 ENCOUNTER — Ambulatory Visit
Admission: RE | Admit: 2023-05-27 | Discharge: 2023-05-27 | Disposition: A | Discharge status: Home / Self Care | Source: Ambulatory Visit | Attending: Nurse Practitioner | Admitting: Nurse Practitioner

## 2023-05-27 DIAGNOSIS — Z1231 Encounter for screening mammogram for malignant neoplasm of breast: Secondary | ICD-10-CM

## 2023-05-30 ENCOUNTER — Ambulatory Visit: Payer: Managed Care, Other (non HMO) | Admitting: Gynecologic Oncology

## 2023-06-07 ENCOUNTER — Inpatient Hospital Stay: Attending: Gynecologic Oncology | Admitting: Gynecologic Oncology

## 2023-06-07 ENCOUNTER — Encounter: Payer: Self-pay | Admitting: Gynecologic Oncology

## 2023-06-07 VITALS — BP 134/77 | HR 72 | Temp 98.0°F | Resp 16 | Ht 65.0 in | Wt 150.2 lb

## 2023-06-07 DIAGNOSIS — Z923 Personal history of irradiation: Secondary | ICD-10-CM | POA: Insufficient documentation

## 2023-06-07 DIAGNOSIS — Z1151 Encounter for screening for human papillomavirus (HPV): Secondary | ICD-10-CM | POA: Diagnosis not present

## 2023-06-07 DIAGNOSIS — Z08 Encounter for follow-up examination after completed treatment for malignant neoplasm: Secondary | ICD-10-CM | POA: Insufficient documentation

## 2023-06-07 DIAGNOSIS — Z9221 Personal history of antineoplastic chemotherapy: Secondary | ICD-10-CM | POA: Diagnosis not present

## 2023-06-07 DIAGNOSIS — C539 Malignant neoplasm of cervix uteri, unspecified: Secondary | ICD-10-CM

## 2023-06-07 DIAGNOSIS — Z8541 Personal history of malignant neoplasm of cervix uteri: Secondary | ICD-10-CM | POA: Diagnosis present

## 2023-06-07 DIAGNOSIS — Z124 Encounter for screening for malignant neoplasm of cervix: Secondary | ICD-10-CM | POA: Insufficient documentation

## 2023-06-07 NOTE — Patient Instructions (Addendum)
 It was good to see you today.  I do not see or feel any evidence of cancer recurrence on your exam.  We will let you know your Pap results next week.  Try some fiber such as Benefiber or Metamucil for your stools.  I will see you for follow-up in 6 months.  As always, if you develop any new and concerning symptoms before your next visit, please call to see me sooner.

## 2023-06-07 NOTE — Progress Notes (Signed)
 Gynecologic Oncology Return Clinic Visit  06/07/23  Reason for Visit: surveillance   Treatment History: Oncology History  Cervical adenocarcinoma (HCC)  09/14/2020 Initial Diagnosis   The patient has a history of prior abnormal paps, did not require biopsies are procedures, normalized. Has previously been HPV+.  More recent history is as follows: Pap 08/2020: ASC-H, HR HPV positive Colposcopy 09/14/20: ECC negative, CIN3 on cervical biopsies   02/15/2021 Pathology Results   [phone]  Cone biopsy of cervix showed invasive adenocarcinoma, 2.1 cm in maximum dimension, 1.5 cm depth of invasion.  Endocervical and deep margins are involved.  High-grade squamous intraepithelial lesion with involvement of endocervical glands were present.  Endocervix curettage show detached fragment of adenocarcinoma. P16 is strongly positive.   03/09/2021 Initial Diagnosis   Cervical adenocarcinoma (HCC)   03/24/2021 PET scan   1. Moderate activity in the cervix with maximum SUV of 5.6. Some of this could be postprocedural/inflammatory related to recent conization, but residual tumor is not excluded. No findings of distant metastatic spread or hypermetabolic adenopathy.   03/30/2021 Imaging   MRI pelvis Repeat imaging was performed with intersection again showing the tumor biased towards the anterior aspect of the cervix but tracking towards the RIGHT posterolateral cervix with stromal disruption at the level of the inferior posterolateral cervix on the RIGHT (image 19/40)   Contrasted imaging (image 20/44) this shows contrast enhancement which follows this pattern. Early extension into the RIGHT posterolateral parametrium at the lower margin of the cervix is demonstrated on both contrasted and precontrast T2 weighted imaging. The lesion involves RIGHT anterolateral 2/3 of the cervix with loss of endocervical distinction noted on image 19 of 40 across the entire interface between the mass and the cervix,  cervical disruption of stroma noted RIGHT posterolateral aspect as described.   Ovaries are normal.   Final impression: Cervical neoplasm with extension into cervical stroma and disruption of cervical stroma most notably along the RIGHT posterolateral margin with early extension into the RIGHT parametrium.     03/31/2021 Cancer Staging   Staging form: Cervix Uteri, AJCC Version 9 - Clinical stage from 03/31/2021: Stage IIB (cT2b, cN0, cM0) - Signed by Almeda Jacobs, MD on 03/31/2021 Stage prefix: Initial diagnosis   04/12/2021 - 05/10/2021 Chemotherapy   Patient is on Treatment Plan : Cervical Cisplatin  q7d     04/12/2021 Procedure   Placement of single lumen port a cath via right internal jugular vein. The catheter tip lies at the cavo-atrial junction. A power injectable port a cath was placed and is ready for immediate use.   04/13/2021 - 07/13/2021 Radiation Therapy   04/13/2021 through 07/13/2021 (Pelvic IMRT: 04/13/21 through 05/30/21) (HDR: 06/01/21 through 07/13/21) Site Technique Total Dose (Gy) Dose per Fx (Gy) Completed Fx Beam Energies  Pelvis: Pelvis IMRT 45/45 1.8 25/25 6X  Pelvis: Pelvis_Bst 3D 9/9 1.8 5/5 15X  Cervix: Cervix_Bst_Fx1 HDR-brachy 5.5/5.5 5.5 1/1 Ir-192  Cervix: Cervix_Bst_Fx2 HDR-brachy 5.5/5.5 5.5 1/1 Ir-192  Cervix: Cervix_Bst_Fx3 HDR-brachy 5.5/5.5 5.5 1/1 Ir-192  Cervix: Cervix_Bst_Fx4 HDR-brachy 5.5/5.5 5.5 1/1 Ir-192  Cervix: Cervix_Bst_Fx5 HDR-brachy 5.5/5.5 5.5 1/1 Ir-192        05/25/2021 Genetic Testing   Negative hereditary cancer genetic testing: no pathogenic variants detected in [facility] CustomNext-Cancer +RNAinsight Panel.  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 Report date is May 25, 2021.   The CustomNext-Cancer+RNAinsight panel offered by [facility] includes sequencing and rearrangement analysis for the following 47 genes:  APC, ATM, AXIN2, BARD1, BMPR1A, BRCA1, BRCA2,  BRIP1, CDH1, CDK4, CDKN2A, CHEK2, DICER1, EPCAM,  GREM1, HOXB13, MEN1, MLH1, MSH2, MSH3, MSH6, MUTYH, NBN, NF1, NF2, NTHL1, PALB2, PMS2, POLD1, POLE, PTEN, RAD51C, RAD51D, RECQL, RET, SDHA, SDHAF2, SDHB, SDHC, SDHD, SMAD4, SMARCA4, STK11, TP53, TSC1, TSC2, and VHL.  RNA data is routinely analyzed for use in variant interpretation for all genes.   10/16/2021 PET scan   1. Decrease in the ill-defined mild hypermetabolic activity in the cervix. 2. No evidence of hypermetabolic metastatic disease in the neck, chest, abdomen, or pelvis.   11/24/2021 Pathology Results   A. CERVICAL BIOPSY:   - Necrotic tissue with acute and chronic inflammation  -  No viable cervical mucosa identified      Interval History: Doing well.  Denies any abdominal or pelvic pain.  Reports more frequent bowel movements, often 3 or 4 times a day.  She used to have 1 bowel movement a week.  She describes the stool as soft, not runny or diarrhea.  Denies any vaginal bleeding or discharge.  Using lubrication with intercourse, denies any issues.  Past Medical/Surgical History: Past Medical History:  Diagnosis Date   Anxiety    Arthritis 2011   in neck and back   Cervical cancer (HCC)    Depression    Difficulty sleeping    Dry eyes    Family history of breast cancer 05/08/2021   Family history of colon cancer 05/08/2021   Family history of uterine cancer 05/08/2021   History of basal cell carcinoma (BCC) excision 2022   skin cancer, 2 areas removed per pt   History of COVID-19    01-2020 and 01-2021 all symptoms resolved   History of radiation therapy    Cervix- 04/23/21-07/13/21- Dr. Retta Caster   Hyperlipidemia    Pre-diabetes 2022   borderline, hgba1c 5.7 in august 2022    Past Surgical History:  Procedure Laterality Date   ANTERIOR CERVICAL DECOMP/DISCECTOMY FUSION N/A 11/18/2020   Procedure: CERVICAL FOUR -CERVICAL FIVE  ANTERIOR CERVICAL DISCECTOMY FUSION, ALLOGRAFT, PLATE;  Surgeon: Adah Acron, MD;  Location: [facility] OR;  Service: Orthopedics;   Laterality: N/A;   BASAL CELL CARCINOMA EXCISION  2022   pt has had areas removed in the past as well   CERVICAL CONE BIOPSY  02/15/2021   HERNIA REPAIR     pt states she had an umbilical hernia repair about 25 years ago (around 1997), unsure if repaired with mesh   IR IMAGING GUIDED PORT INSERTION  04/11/2021   IR REMOVAL TUN ACCESS W/ PORT W/O FL MOD SED  12/11/2021   OPERATIVE ULTRASOUND N/A 06/01/2021   Procedure: OPERATIVE ULTRASOUND;  Surgeon: Retta Caster, MD;  Location: [facility];  Service: Urology;  Laterality: N/A;   OPERATIVE ULTRASOUND N/A 06/19/2021   Procedure: OPERATIVE ULTRASOUND;  Surgeon: Retta Caster, MD;  Location: [facility];  Service: Urology;  Laterality: N/A;   OPERATIVE ULTRASOUND N/A 06/29/2021   Procedure: OPERATIVE ULTRASOUND;  Surgeon: Retta Caster, MD;  Location: [facility];  Service: Urology;  Laterality: N/A;   OPERATIVE ULTRASOUND N/A 07/06/2021   Procedure: OPERATIVE ULTRASOUND;  Surgeon: Retta Caster, MD;  Location: [facility];  Service: Urology;  Laterality: N/A;   OPERATIVE ULTRASOUND N/A 07/13/2021   Procedure: OPERATIVE ULTRASOUND;  Surgeon: Retta Caster, MD;  Location: [facility];  Service: Urology;  Laterality: N/A;   TANDEM RING INSERTION N/A 06/01/2021   Procedure: TANDEM RING INSERTION;  Surgeon: Retta Caster, MD;  Location: [facility];  Service: Urology;  Laterality: N/A;   TANDEM RING INSERTION N/A 06/19/2021   Procedure: TANDEM RING INSERTION;  Surgeon: Retta Caster, MD;  Location: [facility];  Service: Urology;  Laterality: N/A;   TANDEM RING INSERTION N/A 06/29/2021   Procedure: TANDEM RING INSERTION;  Surgeon: Retta Caster, MD;  Location: [facility];  Service: Urology;  Laterality: N/A;   TANDEM RING INSERTION N/A 07/06/2021   Procedure: TANDEM RING INSERTION;  Surgeon: Retta Caster, MD;  Location: [facility];  Service: Urology;  Laterality: N/A;   TANDEM RING INSERTION N/A 07/13/2021   Procedure: TANDEM RING INSERTION;  Surgeon: Retta Caster, MD;  Location: [facility];  Service: Urology;  Laterality: N/A;    Family History  Problem Relation Age of Onset   Cancer Mother        maligant tumor on arm; dx 30s   Endometrial cancer Mother        dx 46s   Breast cancer Maternal Aunt        dx unknown age   Colon cancer Maternal Grandmother        dx unknown age   Uterine cancer Half-Sister        paternal half sister; dx before age 73   Skin cancer Half-Sister        ? melanoma; d. 52   Cervical cancer Other        mother's maternal half sister; dx after 40   Colon cancer Other        MGM's mother; dx unknown age   Prostate cancer Neg Hx    Ovarian cancer Neg Hx     Social History   Socioeconomic History   Marital status: Single    Spouse name: Not on file   Number of children: Not on file   Years of education: Not on file   Highest education level: Not on file  Occupational History   Not on file  Tobacco Use   Smoking status: Never   Smokeless tobacco: Never  Vaping Use   Vaping status: Never Used  Substance and Sexual Activity   Alcohol use: Yes    Comment: very occasional, maybe once or twice a year (1 glass of wine)   Drug use: Never   Sexual activity: Yes  Other Topics Concern   Not on file  Social History Narrative   Not on file   Social Drivers of Health   Financial Resource Strain: Not on file  Food Insecurity: Not on file  Transportation Needs: No Transportation Needs (04/09/2023)   Received from [facility]   PRAPARE - Transportation    Lack of Transportation (Medical): No    Lack of Transportation (Non-Medical): No  Physical Activity: Not on file  Stress: Not on file  Social Connections: Not on file    Current Medications:  Current Outpatient Medications:    Ascorbic Acid (VITAMIN C) 1000 MG tablet, Take 1,000 mg by mouth  daily., Disp: , Rfl:    cholecalciferol (VITAMIN D3) 25 MCG (1000 UNIT) tablet, Take 2,000 Units by mouth daily., Disp: , Rfl:    conjugated estrogens  (PREMARIN ) vaginal cream, Place 1 Applicatorful vaginally 3 (three) times a week. (Patient not taking: Reported on 03/04/2023), Disp: 42.5 g, Rfl: 1   Cyanocobalamin (B-12) 5000 MCG CAPS, Take 5,000 mcg by mouth daily., Disp: , Rfl:    DULoxetine  (CYMBALTA ) 60 MG capsule, Take by mouth at bedtime., Disp: , Rfl:  EYSUVIS 0.25 % SUSP, Apply to eye in the morning, at noon, and at bedtime., Disp: , Rfl:    Maca 500 MG CAPS, Take 500 mg by mouth daily., Disp: , Rfl:    Magnesium  Oxide 250 MG TABS, Take 1 tablet by mouth every morning., Disp: , Rfl:    MIEBO 1.338 GM/ML SOLN, Place 1 drop into both eyes QID., Disp: , Rfl:    Multiple Vitamins-Minerals (CENTRUM SILVER 50+WOMEN) TABS, Take 1 tablet by mouth daily., Disp: , Rfl:    Omega-3 1000 MG CAPS, Take 1,000 mg by mouth daily., Disp: , Rfl:    Red Yeast Rice 600 MG TABS, Take 1,200 mg by mouth daily., Disp: , Rfl:    temazepam (RESTORIL) 30 MG capsule, Take by mouth at bedtime., Disp: , Rfl:    Turmeric Curcumin 500 MG CAPS, Take 1 capsule by mouth daily., Disp: , Rfl:    valACYclovir (VALTREX) 500 MG tablet, Take 500 mg by mouth at bedtime., Disp: , Rfl:   Review of Systems: Denies appetite changes, fevers, chills, fatigue, unexplained weight changes. Denies hearing loss, neck lumps or masses, mouth sores, ringing in ears or voice changes. Denies cough or wheezing.  Denies shortness of breath. Denies chest pain or palpitations. Denies leg swelling. Denies abdominal distention, pain, blood in stools, constipation, diarrhea, nausea, vomiting, or early satiety. Denies pain with intercourse, dysuria, frequency, hematuria or incontinence. Denies hot flashes, pelvic pain, vaginal bleeding or vaginal discharge.   Denies joint pain, back pain or muscle pain/cramps. Denies itching, rash, or  wounds. Denies dizziness, headaches, numbness or seizures. Denies swollen lymph nodes or glands, denies easy bruising or bleeding. Denies anxiety, depression, confusion, or decreased concentration.  Physical Exam: BP 134/77 (BP Location: Left Arm, Patient Position: Sitting)   Pulse 72   Temp 98 F (36.7 C) (Oral)   Resp 16   Ht 5\' 5"  (1.651 m)   Wt 150 lb 3.2 oz (68.1 kg)   LMP 12/18/2018   SpO2 100%   BMI 24.99 kg/m  General: Alert, oriented, no acute distress.  HEENT: Normocephalic, atraumatic. Sclera anicteric.  Chest: Clear to auscultation bilaterally. No wheezes, rhonchi, or rales. Cardiovascular: Regular rate and rhythm, no murmurs, rubs, or gallops.  Abdomen: Normoactive bowel sounds. Soft, nondistended, nontender to palpation. No masses or hepatosplenomegaly appreciated. No palpable fluid wave.  Extremities: Grossly normal range of motion. Warm, well perfused. No edema bilaterally.  Skin: No rashes or lesions.  Lymphatics: No cervical, supraclavicular, or inguinal adenopathy.  GU: Normal appearing external genitalia without erythema, excoriation, or lesions.  On speculum exam, no discharge noted.  Significant radiation changes noted.  Cervix is now flush with the vaginal apex.  No lesions noted.  Pap and HPV collected.  On bimanual exam, cervix is flush with the vaginal apex, no firmness or nodularity. Rectovaginal exam confirms findings.   Laboratory & Radiologic Studies: None new  Assessment & Plan: Robin Steele is a 58 y.o. woman with a history of Stage IIB adenocarcinoma of the cervix who presents for surveillance. Primary chemoRT completed 07/2021. 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 Last pap 05/2022, NIML and HR HPV negative.   Doing well, NED on exam today.     Discussed trial of fiber to see if this helps bulk her stools and decrease the number of stools that she has each day.   Per NCCN surveillance  recommendations, we discussed plan for surveillance visits every 3 months.  She sees  Dr. Eloise Hake in August and will return to see me in 6 months.  Pap and HPV performed today.  We reviewed signs and symptoms that should prompt a phone call between scheduled visits.  22 minutes of total time was spent for this patient encounter, including preparation, face-to-face counseling with the patient and coordination of care, and documentation of the encounter.  Wiley Hanger, MD  Division of Gynecologic Oncology  Department of Obstetrics and Gynecology  [facility]

## 2023-06-17 ENCOUNTER — Ambulatory Visit: Payer: Self-pay | Admitting: Gynecologic Oncology

## 2023-06-17 LAB — CYTOLOGY - PAP
Comment: NEGATIVE
Diagnosis: UNDETERMINED — AB
High risk HPV: POSITIVE — AB

## 2023-06-18 ENCOUNTER — Encounter: Payer: Self-pay | Admitting: Hematology and Oncology

## 2023-06-18 NOTE — Telephone Encounter (Signed)
 Spoke with patient and per Dr Danniel Duverney message got her scheduled for follow up visit on 7/11 @ 2:15pm. patient confirmed and understood.

## 2023-07-17 ENCOUNTER — Encounter: Payer: Self-pay | Admitting: Gynecologic Oncology

## 2023-07-19 ENCOUNTER — Encounter: Payer: Self-pay | Admitting: Gynecologic Oncology

## 2023-07-19 ENCOUNTER — Inpatient Hospital Stay: Attending: Gynecologic Oncology | Admitting: Gynecologic Oncology

## 2023-07-19 VITALS — BP 131/69 | HR 74 | Temp 98.3°F | Resp 20 | Wt 151.2 lb

## 2023-07-19 DIAGNOSIS — Z08 Encounter for follow-up examination after completed treatment for malignant neoplasm: Secondary | ICD-10-CM | POA: Diagnosis present

## 2023-07-19 DIAGNOSIS — C539 Malignant neoplasm of cervix uteri, unspecified: Secondary | ICD-10-CM

## 2023-07-19 DIAGNOSIS — Z8541 Personal history of malignant neoplasm of cervix uteri: Secondary | ICD-10-CM | POA: Insufficient documentation

## 2023-07-19 DIAGNOSIS — R87610 Atypical squamous cells of undetermined significance on cytologic smear of cervix (ASC-US): Secondary | ICD-10-CM

## 2023-07-19 DIAGNOSIS — N898 Other specified noninflammatory disorders of vagina: Secondary | ICD-10-CM | POA: Diagnosis not present

## 2023-07-19 DIAGNOSIS — Z9221 Personal history of antineoplastic chemotherapy: Secondary | ICD-10-CM | POA: Diagnosis not present

## 2023-07-19 DIAGNOSIS — B977 Papillomavirus as the cause of diseases classified elsewhere: Secondary | ICD-10-CM

## 2023-07-19 DIAGNOSIS — Z923 Personal history of irradiation: Secondary | ICD-10-CM | POA: Diagnosis not present

## 2023-07-19 NOTE — Patient Instructions (Signed)
 The office will let you know once we have your results back next week.  Try adding another pill of the Benefiber to see if this helps with your bowel symptoms.

## 2023-07-19 NOTE — Progress Notes (Signed)
 Gynecologic Oncology Return Clinic Visit  07/19/23  Reason for Visit: colposcopy   Treatment History: Oncology History  Cervical adenocarcinoma (HCC)  09/14/2020 Initial Diagnosis   The patient has a history of prior abnormal paps, did not require biopsies are procedures, normalized. Has previously been HPV+.  More recent history is as follows: Pap 08/2020: ASC-H, HR HPV positive Colposcopy 09/14/20: ECC negative, CIN3 on cervical biopsies   02/15/2021 Pathology Results   [id number]  Cone biopsy of cervix showed invasive adenocarcinoma, 2.1 cm in maximum dimension, 1.5 cm depth of invasion.  Endocervical and deep margins are involved.  High-grade squamous intraepithelial lesion with involvement of endocervical glands were present.  Endocervix curettage show detached fragment of adenocarcinoma. P16 is strongly positive.   03/09/2021 Initial Diagnosis   Cervical adenocarcinoma (HCC)   03/24/2021 PET scan   1. Moderate activity in the cervix with maximum SUV of 5.6. Some of this could be postprocedural/inflammatory related to recent conization, but residual tumor is not excluded. No findings of distant metastatic spread or hypermetabolic adenopathy.   03/30/2021 Imaging   MRI pelvis Repeat imaging was performed with intersection again showing the tumor biased towards the anterior aspect of the cervix but tracking towards the RIGHT posterolateral cervix with stromal disruption at the level of the inferior posterolateral cervix on the RIGHT (image 19/40)   Contrasted imaging (image 20/44) this shows contrast enhancement which follows this pattern. Early extension into the RIGHT posterolateral parametrium at the lower margin of the cervix is demonstrated on both contrasted and precontrast T2 weighted imaging. The lesion involves RIGHT anterolateral 2/3 of the cervix with loss of endocervical distinction noted on image 19 of 40 across the entire interface between the mass and the cervix,  cervical disruption of stroma noted RIGHT posterolateral aspect as described.   Ovaries are normal.   Final impression: Cervical neoplasm with extension into cervical stroma and disruption of cervical stroma most notably along the RIGHT posterolateral margin with early extension into the RIGHT parametrium.     03/31/2021 Cancer Staging   Staging form: Cervix Uteri, AJCC Version 9 - Clinical stage from 03/31/2021: Stage IIB (cT2b, cN0, cM0) - Signed by Lonn Hicks, MD on 03/31/2021 Stage prefix: Initial diagnosis   04/12/2021 - 05/10/2021 Chemotherapy   Patient is on Treatment Plan : Cervical Cisplatin  q7d     04/12/2021 Procedure   Placement of single lumen port a cath via right internal jugular vein. The catheter tip lies at the cavo-atrial junction. A power injectable port a cath was placed and is ready for immediate use.   04/13/2021 - 07/13/2021 Radiation Therapy   04/13/2021 through 07/13/2021 (Pelvic IMRT: 04/13/21 through 05/30/21) (HDR: 06/01/21 through 07/13/21) Site Technique Total Dose (Gy) Dose per Fx (Gy) Completed Fx Beam Energies  Pelvis: Pelvis IMRT 45/45 1.8 25/25 6X  Pelvis: Pelvis_Bst 3D 9/9 1.8 5/5 15X  Cervix: Cervix_Bst_Fx1 HDR-brachy 5.5/5.5 5.5 1/1 Ir-192  Cervix: Cervix_Bst_Fx2 HDR-brachy 5.5/5.5 5.5 1/1 Ir-192  Cervix: Cervix_Bst_Fx3 HDR-brachy 5.5/5.5 5.5 1/1 Ir-192  Cervix: Cervix_Bst_Fx4 HDR-brachy 5.5/5.5 5.5 1/1 Ir-192  Cervix: Cervix_Bst_Fx5 HDR-brachy 5.5/5.5 5.5 1/1 Ir-192        05/25/2021 Genetic Testing   Negative hereditary cancer genetic testing: no pathogenic variants detected in [facility] CustomNext-Cancer +RNAinsight Panel.  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 Report date is May 25, 2021.   The CustomNext-Cancer+RNAinsight panel offered by [employer] includes sequencing and rearrangement analysis for the following 47 genes:  APC, ATM, AXIN2, BARD1, BMPR1A, BRCA1, BRCA2,  BRIP1, CDH1, CDK4, CDKN2A, CHEK2, DICER1, EPCAM,  GREM1, HOXB13, MEN1, MLH1, MSH2, MSH3, MSH6, MUTYH, NBN, NF1, NF2, NTHL1, PALB2, PMS2, POLD1, POLE, PTEN, RAD51C, RAD51D, RECQL, RET, SDHA, SDHAF2, SDHB, SDHC, SDHD, SMAD4, SMARCA4, STK11, TP53, TSC1, TSC2, and VHL.  RNA data is routinely analyzed for use in variant interpretation for all genes.   10/16/2021 PET scan   1. Decrease in the ill-defined mild hypermetabolic activity in the cervix. 2. No evidence of hypermetabolic metastatic disease in the neck, chest, abdomen, or pelvis.   11/24/2021 Pathology Results   A. CERVICAL BIOPSY:   - Necrotic tissue with acute and chronic inflammation  -  No viable cervical mucosa identified      Interval History: Overall doing well, has had 2 episodes of bowel incontinence while sleeping, most recent this past Wednesday night.  The last time happened about a month ago.  She notes waking up and having some looser stool in her underwear.  Overall feels that adding Benefiber has helped significantly, now having 1 or 2 bowel movements a day instead of 4-5.  Past Medical/Surgical History: Past Medical History:  Diagnosis Date   Anxiety    Arthritis 2011   in neck and back   Cervical cancer (HCC)    Depression    Difficulty sleeping    Dry eyes    Family history of breast cancer 05/08/2021   Family history of colon cancer 05/08/2021   Family history of uterine cancer 05/08/2021   History of basal cell carcinoma (BCC) excision 2022   skin cancer, 2 areas removed per pt   History of COVID-19    01-2020 and 01-2021 all symptoms resolved   History of radiation therapy    Cervix- 04/23/21-07/13/21- Dr. Lynwood Nasuti   Hyperlipidemia    Pre-diabetes 2022   borderline, hgba1c 5.7 in august 2022    Past Surgical History:  Procedure Laterality Date   ANTERIOR CERVICAL DECOMP/DISCECTOMY FUSION N/A 11/18/2020   Procedure: CERVICAL FOUR -CERVICAL FIVE  ANTERIOR CERVICAL DISCECTOMY FUSION, ALLOGRAFT, PLATE;  Surgeon: Barbarann Oneil BROCKS, MD;  Location: [facility] OR;   Service: Orthopedics;  Laterality: N/A;   BASAL CELL CARCINOMA EXCISION  2022   pt has had areas removed in the past as well   CERVICAL CONE BIOPSY  02/15/2021   HERNIA REPAIR     pt states she had an umbilical hernia repair about 25 years ago (around 1997), unsure if repaired with mesh   IR IMAGING GUIDED PORT INSERTION  04/11/2021   IR REMOVAL TUN ACCESS W/ PORT W/O FL MOD SED  12/11/2021   OPERATIVE ULTRASOUND N/A 06/01/2021   Procedure: OPERATIVE ULTRASOUND;  Surgeon: Nasuti Lynwood, MD;  Location: [facility];  Service: Urology;  Laterality: N/A;   OPERATIVE ULTRASOUND N/A 06/19/2021   Procedure: OPERATIVE ULTRASOUND;  Surgeon: Nasuti Lynwood, MD;  Location: [facility];  Service: Urology;  Laterality: N/A;   OPERATIVE ULTRASOUND N/A 06/29/2021   Procedure: OPERATIVE ULTRASOUND;  Surgeon: Nasuti Lynwood, MD;  Location: [facility];  Service: Urology;  Laterality: N/A;   OPERATIVE ULTRASOUND N/A 07/06/2021   Procedure: OPERATIVE ULTRASOUND;  Surgeon: Nasuti Lynwood, MD;  Location: [facility];  Service: Urology;  Laterality: N/A;   OPERATIVE ULTRASOUND N/A 07/13/2021   Procedure: OPERATIVE ULTRASOUND;  Surgeon: Nasuti Lynwood, MD;  Location: [facility];  Service: Urology;  Laterality: N/A;   TANDEM RING INSERTION N/A 06/01/2021   Procedure: TANDEM RING INSERTION;  Surgeon: Nasuti Lynwood, MD;  Location: [facility];  Service: Urology;  Laterality: N/A;   TANDEM RING INSERTION N/A 06/19/2021   Procedure: TANDEM RING INSERTION;  Surgeon: Shannon Agent, MD;  Location: [facility];  Service: Urology;  Laterality: N/A;   TANDEM RING INSERTION N/A 06/29/2021   Procedure: TANDEM RING INSERTION;  Surgeon: Shannon Agent, MD;  Location: [facility];  Service: Urology;  Laterality: N/A;   TANDEM RING INSERTION N/A 07/06/2021   Procedure: TANDEM RING INSERTION;  Surgeon: Shannon Agent, MD;   Location: [facility];  Service: Urology;  Laterality: N/A;   TANDEM RING INSERTION N/A 07/13/2021   Procedure: TANDEM RING INSERTION;  Surgeon: Shannon Agent, MD;  Location: [facility];  Service: Urology;  Laterality: N/A;    Family History  Problem Relation Age of Onset   Cancer Mother        maligant tumor on arm; dx 30s   Endometrial cancer Mother        dx 73s   Breast cancer Maternal Aunt        dx unknown age   Colon cancer Maternal Grandmother        dx unknown age   Uterine cancer Half-Sister        paternal half sister; dx before age 63   Skin cancer Half-Sister        ? melanoma; d. 71   Cervical cancer Other        mother's maternal half sister; dx after 39   Colon cancer Other        MGM's mother; dx unknown age   Prostate cancer Neg Hx    Ovarian cancer Neg Hx     Social History   Socioeconomic History   Marital status: Single    Spouse name: Not on file   Number of children: Not on file   Years of education: Not on file   Highest education level: Not on file  Occupational History   Not on file  Tobacco Use   Smoking status: Never   Smokeless tobacco: Never  Vaping Use   Vaping status: Never Used  Substance and Sexual Activity   Alcohol use: Yes    Comment: very occasional, maybe once or twice a year (1 glass of wine)   Drug use: Never   Sexual activity: Yes  Other Topics Concern   Not on file  Social History Narrative   Not on file   Social Drivers of Health   Financial Resource Strain: Not on file  Food Insecurity: No Food Insecurity (07/17/2023)   Hunger Vital Sign    Worried About Running Out of Food in the Last Year: Never true    Ran Out of Food in the Last Year: Never true  Transportation Needs: No Transportation Needs (07/17/2023)   PRAPARE - Administrator, Civil Service (Medical): No    Lack of Transportation (Non-Medical): No  Physical Activity: Not on file  Stress: Not on file  Social  Connections: Not on file    Current Medications:  Current Outpatient Medications:    Ascorbic Acid (VITAMIN C) 1000 MG tablet, Take 1,000 mg by mouth daily., Disp: , Rfl:    cholecalciferol (VITAMIN D3) 25 MCG (1000 UNIT) tablet, Take 2,000 Units by mouth daily., Disp: , Rfl:    Cyanocobalamin (B-12) 5000 MCG CAPS, Take 5,000 mcg by mouth daily., Disp: , Rfl:    DULoxetine  (CYMBALTA ) 60 MG capsule, Take by mouth at bedtime., Disp: ,  Rfl:    EYSUVIS 0.25 % SUSP, Apply to eye in the morning, at noon, and at bedtime., Disp: , Rfl:    Maca 500 MG CAPS, Take 500 mg by mouth daily., Disp: , Rfl:    Magnesium  Oxide 250 MG TABS, Take 1 tablet by mouth every morning., Disp: , Rfl:    MIEBO 1.338 GM/ML SOLN, Place 1 drop into both eyes QID., Disp: , Rfl:    Multiple Vitamins-Minerals (CENTRUM SILVER 50+WOMEN) TABS, Take 1 tablet by mouth daily., Disp: , Rfl:    Omega-3 1000 MG CAPS, Take 1,000 mg by mouth daily., Disp: , Rfl:    Red Yeast Rice 600 MG TABS, Take 1,200 mg by mouth daily., Disp: , Rfl:    temazepam (RESTORIL) 30 MG capsule, Take by mouth at bedtime., Disp: , Rfl:    valACYclovir (VALTREX) 500 MG tablet, Take 500 mg by mouth at bedtime., Disp: , Rfl:   Review of Systems: Denies appetite changes, fevers, chills, fatigue, unexplained weight changes. Denies hearing loss, neck lumps or masses, mouth sores, ringing in ears or voice changes. Denies cough or wheezing.  Denies shortness of breath. Denies chest pain or palpitations. Denies leg swelling. Denies abdominal distention, pain, blood in stools, constipation, diarrhea, nausea, vomiting, or early satiety. Denies pain with intercourse, dysuria, frequency, hematuria or incontinence. Denies hot flashes, pelvic pain, vaginal bleeding or vaginal discharge.   Denies joint pain, back pain or muscle pain/cramps. Denies itching, rash, or wounds. Denies dizziness, headaches, numbness or seizures. Denies swollen lymph nodes or glands, denies  easy bruising or bleeding. Denies anxiety, depression, confusion, or decreased concentration.  Physical Exam: BP 131/69 (BP Location: Left Arm, Patient Position: Sitting)   Pulse 74   Temp 98.3 F (36.8 C) (Oral)   Resp 20   Wt 151 lb 3.2 oz (68.6 kg)   LMP 12/18/2018   SpO2 100%   BMI 25.16 kg/m  General: Alert, oriented, no acute distress. HEENT: Posterior oropharynx clear, sclera anicteric.  GU: Normal appearing external genitalia without erythema, excoriation, or lesions.  See colposcopy findings below.  Vaginal biopsy Preoperative diagnosis: ASCUS, HR HPV + Postoperative diagnosis: Same as above Physician: Viktoria MD Estimated blood loss: Minimal Specimens: Cervical biopsy Procedure: After the procedure was discussed with the patient including risks and benefits, she gave verbal consent.  She was then placed in dorsolithotomy position and a speculum was placed in the vagina.  Acetic acid was applied to the upper vagina.  Cervix was somewhat agglutinated at the upper aspect of the vagina.  After application of 5% acetic acid, some mild acetowhite was noted from 11-12 o'clock along the very upper vagina and along the posterior aspect at 6:00.  Some mild atypical vascularity visualized although I suspect this is related to radiation changes.  The anterior vagina was cleansed with Betadine x3.  Tischler forceps were then used to take a biopsy from the central portion of the cervix where necrosis was noted.  This was placed in formalin.  Overall the patient tolerated the procedure well.  All instruments were removed from the vagina.   Laboratory & Radiologic Studies: Pap 05/2023: ASUCS, HR HPV +  Assessment & Plan: KESHA HURRELL is a 58 y.o. woman  a history of Stage IIB adenocarcinoma of the cervix who presents for surveillance. Primary chemoRT completed 07/2021. 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 Recent Pap with ASCUS, high risk  HPV positive.   Patient overall doing well.  Discussed  adding some more fiber to her diet, which has helped with the number of bowel movements and will hopefully help with her fecal incontinence.  Colposcopy and vaginoscopy performed today with some mild changes noted that I suspect are related to radiation.  Anterior vaginal biopsy taken from the apical vagina.  20 minutes of total time was spent for this patient encounter, including preparation, face-to-face counseling with the patient and coordination of care, and documentation of the encounter.  Comer Dollar, MD  Division of Gynecologic Oncology  Department of Obstetrics and Gynecology  [facility]   [facility]als

## 2023-07-24 ENCOUNTER — Ambulatory Visit: Payer: Self-pay | Admitting: Gynecologic Oncology

## 2023-07-24 LAB — SURGICAL PATHOLOGY

## 2023-07-29 ENCOUNTER — Encounter: Payer: Self-pay | Admitting: Gynecologic Oncology

## 2023-08-05 ENCOUNTER — Telehealth: Payer: Self-pay

## 2023-08-05 NOTE — Telephone Encounter (Signed)
 Leave of Absence forms received from [employer]. Requested information provided to work remotely d/t bowel incontinence. Forms faxed to [employer].   Pt is aware via Mychart

## 2023-08-26 ENCOUNTER — Ambulatory Visit: Payer: Managed Care, Other (non HMO) | Admitting: Radiation Oncology

## 2023-09-11 ENCOUNTER — Telehealth: Payer: Self-pay

## 2023-09-11 NOTE — Telephone Encounter (Signed)
 I spoke to Robin Steele,she states she has a BM at least 2x a day, depending on what she eats. She is taking fiber daily and is eating more red meat. Her stools seem to be more formed than about 1 month ago. She reports getting a gas bubble so she never knows what to expect.   Message sent to provider for advice

## 2023-09-11 NOTE — Telephone Encounter (Signed)
-----   Message from Robin Steele sent at 09/11/2023 11:51 AM EDT ----- Please reach back out and find out what the is current status of her bowels (how many times a day etc, having urge to go?). When she saw Dr. Viktoria last, it sounded like things were improving with fiber etc.

## 2023-09-11 NOTE — Telephone Encounter (Signed)
 Per [name] NP,  I reached out and left a voicemail for Robin Steele to call the office in regards to her recent Mychart message.

## 2023-09-12 ENCOUNTER — Encounter: Payer: Self-pay | Admitting: Hematology and Oncology

## 2023-09-12 NOTE — Telephone Encounter (Signed)
 Ms Molino is aware of re-evaluation of GI issue in 6 months with Dr.Tucker. Appointment is January 16,2026

## 2023-10-12 NOTE — Progress Notes (Signed)
 Radiation Oncology         [phone] ________________________________  Name: Robin Steele MRN: [id number]  Date: 10/14/2023  DOB: 1965/06/10  Follow-Up Visit Note  CC: Skillman, Katherine E, PA-C  Skillman, Katherine E, *  No diagnosis found.  Diagnosis: The encounter diagnosis was Cervical adenocarcinoma (HCC).   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 +)  Interval Since Last Radiation: 2 years and 3 months   Intent: Curative  Radiation Treatment Dates: 04/13/2021 through 07/13/2021 (Pelvic IMRT: 04/13/21 through 05/30/21) (HDR: 06/01/21 through 07/13/21) Site Technique Total Dose (Gy) Dose per Fx (Gy) Completed Fx Beam Energies  Pelvis: Pelvis IMRT 45/45 1.8 25/25 6X  Pelvis: Pelvis_Bst 3D 9/9 1.8 5/5 15X  Cervix: Cervix_Bst_Fx1 HDR-brachy 5.5/5.5 5.5 1/1 Ir-192  Cervix: Cervix_Bst_Fx2 HDR-brachy 5.5/5.5 5.5 1/1 Ir-192  Cervix: Cervix_Bst_Fx3 HDR-brachy 5.5/5.5 5.5 1/1 Ir-192  Cervix: Cervix_Bst_Fx4 HDR-brachy 5.5/5.5 5.5 1/1 Ir-192  Cervix: Cervix_Bst_Fx5 HDR-brachy 5.5/5.5 5.5 1/1 Ir-192   Narrative:  The patient returns today for routine follow-up. She was last seen here for follow-up on 03/04/23.   Since her last visit, she was seen by Dr. Viktoria on 05/28/23 for a routine follow-up visit. She was noted to be doing well and NED on examination at that time. Routine Pap and HPV testing were collected during the visit; results showed HPV+ ASC-US .  Given her Pap results, she returned to Dr. Viktoria on 07/19/23 for a vaginal biopsy. Pathology results thankfully showed no evidence of malignancy, with findings favoring radiation changes.       Imaging performed in the interval since her last visit includes:  -- A cardiac scoring CT on 04/26/23 which showed a coronary calcium score of 0. Over-read chest CT findings also showed no significant extracardiac findings in the chest.      -- Bilateral screening mammogram on 05/27/23 showed no evidence of malignancy in either breast.       ***                    Allergies:  has no known allergies.  Meds: Current Outpatient Medications  Medication Sig Dispense Refill   Ascorbic Acid (VITAMIN C) 1000 MG tablet Take 1,000 mg by mouth daily.     cholecalciferol (VITAMIN D3) 25 MCG (1000 UNIT) tablet Take 2,000 Units by mouth daily.     Cyanocobalamin (B-12) 5000 MCG CAPS Take 5,000 mcg by mouth daily.     DULoxetine  (CYMBALTA ) 60 MG capsule Take by mouth at bedtime.     EYSUVIS 0.25 % SUSP Apply to eye in the morning, at noon, and at bedtime.     Maca 500 MG CAPS Take 500 mg by mouth daily.     Magnesium  Oxide 250 MG TABS Take 1 tablet by mouth every morning.     MIEBO 1.338 GM/ML SOLN Place 1 drop into both eyes QID.     Multiple Vitamins-Minerals (CENTRUM SILVER 50+WOMEN) TABS Take 1 tablet by mouth daily.     Omega-3 1000 MG CAPS Take 1,000 mg by mouth daily.     Red Yeast Rice 600 MG TABS Take 1,200 mg by mouth daily.     temazepam (RESTORIL) 30 MG capsule Take by mouth at bedtime.     valACYclovir (VALTREX) 500 MG tablet Take 500 mg by mouth at bedtime.     No current facility-administered medications for this encounter.    Physical Findings: The patient is in no acute distress. Patient is alert and oriented.  vitals were not taken  for this visit. .  No significant changes. Lungs are clear to auscultation bilaterally. Heart has regular rate and rhythm. No palpable cervical, supraclavicular, or axillary adenopathy. Abdomen soft, non-tender, normal bowel sounds.  On pelvic examination the external genitalia were unremarkable. A speculum exam was performed. There are no mucosal lesions noted in the vaginal vault. A Pap smear was obtained of the proximal vagina. On bimanual and rectovaginal examination there were no pelvic masses appreciated. ***   Lab Findings: Lab Results  Component Value Date   WBC 3.4 (L) 10/13/2021   HGB 11.5 (L) 10/13/2021   HCT 34.6 (L) 10/13/2021   MCV 84.0 10/13/2021   PLT 200  10/13/2021    Radiographic Findings: No results found.  Impression: 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 +)  The patient is recovering from the effects of radiation.  ***  Plan:  ***   *** minutes of total time was spent for this patient encounter, including preparation, face-to-face counseling with the patient and coordination of care, physical exam, and documentation of the encounter. ____________________________________  Lynwood CHARM Nasuti, PhD, MD  This document serves as a record of services personally performed by Lynwood Nasuti, MD. It was created on his behalf by Dorthy Fuse, a trained medical scribe. The creation of this record is based on the scribe's personal observations and the provider's statements to them. This document has been checked and approved by the attending provider.

## 2023-10-14 ENCOUNTER — Encounter: Payer: Self-pay | Admitting: Radiation Oncology

## 2023-10-14 ENCOUNTER — Ambulatory Visit
Admission: RE | Admit: 2023-10-14 | Discharge: 2023-10-14 | Disposition: A | Discharge status: Home / Self Care | Source: Ambulatory Visit | Attending: Radiation Oncology | Admitting: Radiation Oncology

## 2023-10-14 DIAGNOSIS — R159 Full incontinence of feces: Secondary | ICD-10-CM | POA: Insufficient documentation

## 2023-10-14 DIAGNOSIS — R140 Abdominal distension (gaseous): Secondary | ICD-10-CM | POA: Insufficient documentation

## 2023-10-14 DIAGNOSIS — C539 Malignant neoplasm of cervix uteri, unspecified: Secondary | ICD-10-CM

## 2023-10-14 DIAGNOSIS — Z9221 Personal history of antineoplastic chemotherapy: Secondary | ICD-10-CM | POA: Insufficient documentation

## 2023-10-14 DIAGNOSIS — Z79899 Other long term (current) drug therapy: Secondary | ICD-10-CM | POA: Insufficient documentation

## 2023-10-14 DIAGNOSIS — Z8541 Personal history of malignant neoplasm of cervix uteri: Secondary | ICD-10-CM | POA: Insufficient documentation

## 2023-10-14 DIAGNOSIS — Z923 Personal history of irradiation: Secondary | ICD-10-CM | POA: Insufficient documentation

## 2023-10-14 NOTE — Progress Notes (Signed)
 SASKIA SIMERSON is here today for follow up post radiation to the pelvic.  They completed their radiation on:  07/13/21  Does the patient complain of any of the following:  Pain: No Abdominal bloating:  Yes at times  Diarrhea/Constipation: Diarrhea has improved.   Nausea/Vomiting: No Vaginal Discharge: Yes, small amount  Blood in Urine or Stool: No Urinary Issues (dysuria/incomplete emptying/ incontinence/ increased frequency/urgency):  No Does patient report using vaginal dilator 2-3 times a week and/or sexually active 2-3 weeks: Yes Post radiation skin changes: No   Additional comments if applicable:   BP (P) 119/85 (BP Location: Left Arm, Patient Position: Sitting)   Pulse (P) 70   Temp (!) (P) 97.3 F (36.3 C) (Temporal)   Resp (P) 18   Ht (P) 5' 5 (1.651 m)   Wt (P) 146 lb 4 oz (66.3 kg)   LMP 12/18/2018   BMI (P) 24.34 kg/m

## 2023-11-11 ENCOUNTER — Encounter: Payer: Self-pay | Admitting: Radiology

## 2023-12-19 ENCOUNTER — Ambulatory Visit: Admitting: Gynecologic Oncology

## 2024-01-22 ENCOUNTER — Encounter: Payer: Self-pay | Admitting: Gynecologic Oncology

## 2024-01-22 NOTE — Patient Instructions (Signed)
 It was good to see you today. No concerning findings on today's exam.  Plan to follow up with Dr. Shannon in April and Dr. Viktoria in July 2026 or sooner if needed.  Please call the office for any questions, concerns, or new symptoms.  Symptoms to report to your health care team include vaginal bleeding, rectal bleeding, bloating, weight loss without effort, new and persistent pain, new and  persistent fatigue, new leg swelling, new masses (i.e., bumps in your neck or groin), new and persistent cough, new and persistent nausea and vomiting, change in bowel or bladder habits, and any other concerns.

## 2024-01-22 NOTE — Progress Notes (Unsigned)
 Gynecologic Oncology Return Clinic Visit  01/22/2024  Reason for Visit: surveillance   Treatment History: Oncology History  Cervical adenocarcinoma (HCC)  09/14/2020 Initial Diagnosis   The patient has a history of prior abnormal paps, did not require biopsies are procedures, normalized. Has previously been HPV+.  More recent history is as follows: Pap 08/2020: ASC-H, HR HPV positive Colposcopy 09/14/20: ECC negative, CIN3 on cervical biopsies   02/15/2021 Pathology Results   [id number]  Cone biopsy of cervix showed invasive adenocarcinoma, 2.1 cm in maximum dimension, 1.5 cm depth of invasion.  Endocervical and deep margins are involved.  High-grade squamous intraepithelial lesion with involvement of endocervical glands were present.  Endocervix curettage show detached fragment of adenocarcinoma. P16 is strongly positive.   03/09/2021 Initial Diagnosis   Cervical adenocarcinoma (HCC)   03/24/2021 PET scan   1. Moderate activity in the cervix with maximum SUV of 5.6. Some of this could be postprocedural/inflammatory related to recent conization, but residual tumor is not excluded. No findings of distant metastatic spread or hypermetabolic adenopathy.   03/30/2021 Imaging   MRI pelvis Repeat imaging was performed with intersection again showing the tumor biased towards the anterior aspect of the cervix but tracking towards the RIGHT posterolateral cervix with stromal disruption at the level of the inferior posterolateral cervix on the RIGHT (image 19/40)   Contrasted imaging (image 20/44) this shows contrast enhancement which follows this pattern. Early extension into the RIGHT posterolateral parametrium at the lower margin of the cervix is demonstrated on both contrasted and precontrast T2 weighted imaging. The lesion involves RIGHT anterolateral 2/3 of the cervix with loss of endocervical distinction noted on image 19 of 40 across the entire interface between the mass and the cervix,  cervical disruption of stroma noted RIGHT posterolateral aspect as described.   Ovaries are normal.   Final impression: Cervical neoplasm with extension into cervical stroma and disruption of cervical stroma most notably along the RIGHT posterolateral margin with early extension into the RIGHT parametrium.     03/31/2021 Cancer Staging   Staging form: Cervix Uteri, AJCC Version 9 - Clinical stage from 03/31/2021: Stage IIB (cT2b, cN0, cM0) - Signed by Lonn Hicks, MD on 03/31/2021 Stage prefix: Initial diagnosis   04/12/2021 - 05/10/2021 Chemotherapy   Patient is on Treatment Plan : Cervical Cisplatin  q7d     04/12/2021 Procedure   Placement of single lumen port a cath via right internal jugular vein. The catheter tip lies at the cavo-atrial junction. A power injectable port a cath was placed and is ready for immediate use.   04/13/2021 - 07/13/2021 Radiation Therapy   04/13/2021 through 07/13/2021 (Pelvic IMRT: 04/13/21 through 05/30/21) (HDR: 06/01/21 through 07/13/21) Site Technique Total Dose (Gy) Dose per Fx (Gy) Completed Fx Beam Energies  Pelvis: Pelvis IMRT 45/45 1.8 25/25 6X  Pelvis: Pelvis_Bst 3D 9/9 1.8 5/5 15X  Cervix: Cervix_Bst_Fx1 HDR-brachy 5.5/5.5 5.5 1/1 Ir-192  Cervix: Cervix_Bst_Fx2 HDR-brachy 5.5/5.5 5.5 1/1 Ir-192  Cervix: Cervix_Bst_Fx3 HDR-brachy 5.5/5.5 5.5 1/1 Ir-192  Cervix: Cervix_Bst_Fx4 HDR-brachy 5.5/5.5 5.5 1/1 Ir-192  Cervix: Cervix_Bst_Fx5 HDR-brachy 5.5/5.5 5.5 1/1 Ir-192        05/25/2021 Genetic Testing   Negative hereditary cancer genetic testing: no pathogenic variants detected in [facility] CustomNext-Cancer +RNAinsight Panel.  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 Report date is May 25, 2021.   The CustomNext-Cancer+RNAinsight panel offered by [employer] includes sequencing and rearrangement analysis for the following 47 genes:  APC, ATM, AXIN2, BARD1, BMPR1A, BRCA1, BRCA2,  BRIP1, CDH1, CDK4, CDKN2A, CHEK2, DICER1, EPCAM,  GREM1, HOXB13, MEN1, MLH1, MSH2, MSH3, MSH6, MUTYH, NBN, NF1, NF2, NTHL1, PALB2, PMS2, POLD1, POLE, PTEN, RAD51C, RAD51D, RECQL, RET, SDHA, SDHAF2, SDHB, SDHC, SDHD, SMAD4, SMARCA4, STK11, TP53, TSC1, TSC2, and VHL.  RNA data is routinely analyzed for use in variant interpretation for all genes.   10/16/2021 PET scan   1. Decrease in the ill-defined mild hypermetabolic activity in the cervix. 2. No evidence of hypermetabolic metastatic disease in the neck, chest, abdomen, or pelvis.   11/24/2021 Pathology Results   A. CERVICAL BIOPSY:   - Necrotic tissue with acute and chronic inflammation  -  No viable cervical mucosa identified      Interval History: Doing well.  Denies any abdominal or pelvic pain.  Reports more frequent bowel movements, often 3 or 4 times a day.  She used to have 1 bowel movement a week.  She describes the stool as soft, not runny or diarrhea.  Denies any vaginal bleeding or discharge.  Using lubrication with intercourse, denies any issues.  Past Medical/Surgical History: Past Medical History:  Diagnosis Date   Anxiety    Arthritis 2011   in neck and back   Cervical cancer (HCC)    Depression    Difficulty sleeping    Dry eyes    Family history of breast cancer 05/08/2021   Family history of colon cancer 05/08/2021   Family history of uterine cancer 05/08/2021   History of basal cell carcinoma (BCC) excision 2022   skin cancer, 2 areas removed per pt   History of COVID-19    01-2020 and 01-2021 all symptoms resolved   History of radiation therapy    Cervix- 04/23/21-07/13/21- Dr. Lynwood Nasuti   Hyperlipidemia    Pre-diabetes 2022   borderline, hgba1c 5.7 in august 2022    Past Surgical History:  Procedure Laterality Date   ANTERIOR CERVICAL DECOMP/DISCECTOMY FUSION N/A 11/18/2020   Procedure: CERVICAL FOUR -CERVICAL FIVE  ANTERIOR CERVICAL DISCECTOMY FUSION, ALLOGRAFT, PLATE;  Surgeon: Barbarann Oneil BROCKS, MD;  Location: [facility] OR;  Service: Orthopedics;   Laterality: N/A;   BASAL CELL CARCINOMA EXCISION  2022   pt has had areas removed in the past as well   CERVICAL CONE BIOPSY  02/15/2021   HERNIA REPAIR     pt states she had an umbilical hernia repair about 25 years ago (around 1997), unsure if repaired with mesh   IR IMAGING GUIDED PORT INSERTION  04/11/2021   IR REMOVAL TUN ACCESS W/ PORT W/O FL MOD SED  12/11/2021   OPERATIVE ULTRASOUND N/A 06/01/2021   Procedure: OPERATIVE ULTRASOUND;  Surgeon: Nasuti Lynwood, MD;  Location[facility];  Service: Urology;  Laterality: N/A;   OPERATIVE ULTRASOUND N/A 06/19/2021   Procedure: OPERATIVE ULTRASOUND;  Surgeon: Nasuti Lynwood, MD;  Location: [facility];  Service: Urology;  Laterality: N/A;   OPERATIVE ULTRASOUND N/A 06/29/2021   Procedure: OPERATIVE ULTRASOUND;  Surgeon: Nasuti Lynwood, MD;  Location: [facility];  Service: Urology;  Laterality: N/A;   OPERATIVE ULTRASOUND N/A 07/06/2021   Procedure: OPERATIVE ULTRASOUND;  Surgeon: Nasuti Lynwood, MD;  Location: [facility];  Service: Urology;  Laterality: N/A;   OPERATIVE ULTRASOUND N/A 07/13/2021   Procedure: OPERATIVE ULTRASOUND;  Surgeon: Nasuti Lynwood, MD;  Location: [facility];  Service: Urology;  Laterality: N/A;   TANDEM RING INSERTION N/A 06/01/2021   Procedure: TANDEM RING INSERTION;  Surgeon: Nasuti Lynwood, MD;  Location: [facility];  Service: Urology;  Laterality: N/A;   TANDEM RING INSERTION N/A 06/19/2021   Procedure: TANDEM RING INSERTION;  Surgeon: Shannon Agent, MD;  Location: [facility];  Service: Urology;  Laterality: N/A;   TANDEM RING INSERTION N/A 06/29/2021   Procedure: TANDEM RING INSERTION;  Surgeon: Shannon Agent, MD;  Location: [facility];  Service: Urology;  Laterality: N/A;   TANDEM RING INSERTION N/A 07/06/2021   Procedure: TANDEM RING INSERTION;  Surgeon: Shannon Agent, MD;  Location: [facility];  Service: Urology;  Laterality: N/A;   TANDEM RING INSERTION N/A 07/13/2021   Procedure: TANDEM RING INSERTION;  Surgeon: Shannon Agent, MD;  Location: [facility];  Service: Urology;  Laterality: N/A;    Family History  Problem Relation Age of Onset   Cancer Mother        maligant tumor on arm; dx 30s   Endometrial cancer Mother        dx 25s   Breast cancer Maternal Aunt        dx unknown age   Colon cancer Maternal Grandmother        dx unknown age   Uterine cancer Half-Sister        paternal half sister; dx before age 53   Skin cancer Half-Sister        ? melanoma; d. 43   Cervical cancer Other        mother's maternal half sister; dx after 64   Colon cancer Other        MGM's mother; dx unknown age   Prostate cancer Neg Hx    Ovarian cancer Neg Hx     Social History   Socioeconomic History   Marital status: Single    Spouse name: Not on file   Number of children: Not on file   Years of education: Not on file   Highest education level: Not on file  Occupational History   Not on file  Tobacco Use   Smoking status: Never   Smokeless tobacco: Never  Vaping Use   Vaping status: Never Used  Substance and Sexual Activity   Alcohol use: Yes    Comment: very occasional, maybe once or twice a year (1 glass of wine)   Drug use: Never   Sexual activity: Yes  Other Topics Concern   Not on file  Social History Narrative   Not on file   Social Drivers of Health   Tobacco Use: Low Risk (07/19/2023)   Patient History    Smoking Tobacco Use: Never    Smokeless Tobacco Use: Never    Passive Exposure: Not on file  Financial Resource Strain: Not on file  Food Insecurity: No Food Insecurity (01/22/2024)   Epic    Worried About Programme Researcher, Broadcasting/film/video in the Last Year: Never true    Ran Out of Food in the Last Year: Never true  Transportation Needs: No Transportation Needs (01/22/2024)   Epic    Lack of Transportation (Medical): No    Lack of  Transportation (Non-Medical): No  Physical Activity: Not on file  Stress: Not on file  Social Connections: Not on file  Depression (PHQ2-9): Low Risk (01/22/2024)   Depression (PHQ2-9)    PHQ-2 Score: 0  Alcohol Screen: Not on file  Housing: Unknown (01/22/2024)   Epic    Unable to Pay for Housing in the Last Year: No    Number of Times Moved in the Last Year: Not  on file    Homeless in the Last Year: No  Utilities: Not At Risk (01/22/2024)   Epic    Threatened with loss of utilities: No  Health Literacy: Low Risk (04/09/2023)   Received from [facility]    How often do you need to have someone help you when you read instructions, pamphlets, or other written material from your doctor or pharmacy?: Never    Current Medications:  Current Outpatient Medications:    Ascorbic Acid (VITAMIN C) 1000 MG tablet, Take 1,000 mg by mouth daily., Disp: , Rfl:    cholecalciferol (VITAMIN D3) 25 MCG (1000 UNIT) tablet, Take 2,000 Units by mouth daily., Disp: , Rfl:    Cyanocobalamin (B-12) 5000 MCG CAPS, Take 5,000 mcg by mouth daily., Disp: , Rfl:    DULoxetine  (CYMBALTA ) 60 MG capsule, Take by mouth at bedtime., Disp: , Rfl:    EYSUVIS 0.25 % SUSP, Apply to eye in the morning, at noon, and at bedtime., Disp: , Rfl:    Maca 500 MG CAPS, Take 500 mg by mouth daily., Disp: , Rfl:    Magnesium  Oxide 250 MG TABS, Take 1 tablet by mouth every morning., Disp: , Rfl:    MIEBO 1.338 GM/ML SOLN, Place 1 drop into both eyes QID., Disp: , Rfl:    Misc Natural Products (NEURIVA) CAPS, Take by mouth daily., Disp: , Rfl:    Multiple Vitamins-Minerals (CENTRUM SILVER 50+WOMEN) TABS, Take 1 tablet by mouth daily., Disp: , Rfl:    Omega-3 1000 MG CAPS, Take 1,000 mg by mouth daily., Disp: , Rfl:    Red Yeast Rice 600 MG TABS, Take 1,200 mg by mouth daily., Disp: , Rfl:    temazepam (RESTORIL) 30 MG capsule, Take by mouth at bedtime., Disp: , Rfl:    valACYclovir (VALTREX) 500 MG tablet, Take  500 mg by mouth at bedtime., Disp: , Rfl:   Review of Systems: Denies appetite changes, fevers, chills, fatigue, unexplained weight changes. Denies hearing loss, neck lumps or masses, mouth sores, ringing in ears or voice changes. Denies cough or wheezing.  Denies shortness of breath. Denies chest pain or palpitations. Denies leg swelling. Denies abdominal distention, pain, blood in stools, constipation, diarrhea, nausea, vomiting, or early satiety. Denies pain with intercourse, dysuria, frequency, hematuria or incontinence. Denies hot flashes, pelvic pain, vaginal bleeding or vaginal discharge.   Denies joint pain, back pain or muscle pain/cramps. Denies itching, rash, or wounds. Denies dizziness, headaches, numbness or seizures. Denies swollen lymph nodes or glands, denies easy bruising or bleeding. Denies anxiety, depression, confusion, or decreased concentration.  Physical Exam: LMP 12/18/2018  General: Alert, oriented, no acute distress.  HEENT: Normocephalic, atraumatic. Sclera anicteric.  Chest: Clear to auscultation bilaterally. No wheezes, rhonchi, or rales. Cardiovascular: Regular rate and rhythm, no murmurs, rubs, or gallops.  Abdomen: Normoactive bowel sounds. Soft, nondistended, nontender to palpation. No masses or hepatosplenomegaly appreciated. No palpable fluid wave.  Extremities: Grossly normal range of motion. Warm, well perfused. No edema bilaterally.  Skin: No rashes or lesions.  Lymphatics: No cervical, supraclavicular, or inguinal adenopathy.  GU: Normal appearing external genitalia without erythema, excoriation, or lesions.  On speculum exam, no discharge noted.  Significant radiation changes noted.  Cervix is now flush with the vaginal apex.  No lesions noted.  Pap and HPV collected.  On bimanual exam, cervix is flush with the vaginal apex, no firmness or nodularity. Rectovaginal exam confirms findings.   Laboratory & Radiologic Studies: None new  Assessment &  Plan: Robin Steele is a 59 y.o. woman with a history of Stage IIB adenocarcinoma of the cervix who presents for surveillance. Primary chemoRT completed 07/2021. 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 Last pap 05/2022, NIML and HR HPV negative.   Doing well, NED on exam today.     Discussed trial of fiber to see if this helps bulk her stools and decrease the number of stools that she has each day.   Per NCCN surveillance recommendations, we discussed plan for surveillance visits every 3 months.  She sees Dr. Shannon in August and will return to see me in 6 months.  Pap and HPV performed today.  We reviewed signs and symptoms that should prompt a phone call between scheduled visits.  22 minutes of total time was spent for this patient encounter, including preparation, face-to-face counseling with the patient and coordination of care, and documentation of the encounter.  [name] NP [facility]

## 2024-01-24 ENCOUNTER — Inpatient Hospital Stay: Attending: Gynecologic Oncology | Admitting: Gynecologic Oncology

## 2024-01-24 VITALS — BP 126/73 | HR 73 | Temp 98.8°F | Resp 18 | Wt 148.0 lb

## 2024-01-24 DIAGNOSIS — Z8541 Personal history of malignant neoplasm of cervix uteri: Secondary | ICD-10-CM | POA: Insufficient documentation

## 2024-01-24 DIAGNOSIS — Z923 Personal history of irradiation: Secondary | ICD-10-CM | POA: Diagnosis not present

## 2024-01-24 DIAGNOSIS — Z08 Encounter for follow-up examination after completed treatment for malignant neoplasm: Secondary | ICD-10-CM | POA: Diagnosis not present

## 2024-01-24 DIAGNOSIS — C539 Malignant neoplasm of cervix uteri, unspecified: Secondary | ICD-10-CM

## 2024-01-24 DIAGNOSIS — Z9221 Personal history of antineoplastic chemotherapy: Secondary | ICD-10-CM | POA: Insufficient documentation

## 2024-01-28 ENCOUNTER — Telehealth: Payer: Self-pay

## 2024-01-28 NOTE — Telephone Encounter (Signed)
 Accomodation medical assessment form received.   Per [name] NP, requested information provided to have her accommodations extended to allow her to work from home due to bowel urgency.  Forms faxed to [employer] with accommodations extended until 07/23/24. Follow up appointment is on 07/31/24 for re-evalutaion

## 2024-04-13 ENCOUNTER — Ambulatory Visit: Payer: Self-pay | Admitting: Radiation Oncology

## 2024-04-24 ENCOUNTER — Ambulatory Visit: Admitting: Cardiovascular Disease

## 2024-07-31 ENCOUNTER — Inpatient Hospital Stay: Admitting: Gynecologic Oncology
